# Patient Record
Sex: Male | Born: 1980
Health system: Southern US, Community
[De-identification: ages and names within clinical notes are randomized; demographics above are authoritative.]

## PROBLEM LIST (undated history)

## (undated) DIAGNOSIS — Q357 Cleft uvula: Secondary | ICD-10-CM

## (undated) DIAGNOSIS — F4310 Post-traumatic stress disorder, unspecified: Secondary | ICD-10-CM

## (undated) DIAGNOSIS — F319 Bipolar disorder, unspecified: Secondary | ICD-10-CM

## (undated) DIAGNOSIS — I10 Essential (primary) hypertension: Secondary | ICD-10-CM

## (undated) DIAGNOSIS — F909 Attention-deficit hyperactivity disorder, unspecified type: Secondary | ICD-10-CM

## (undated) HISTORY — PX: EXTERNAL EAR SURGERY: SHX627

## (undated) HISTORY — DX: Essential (primary) hypertension: I10

## (undated) HISTORY — DX: Post-traumatic stress disorder, unspecified: F43.10

## (undated) HISTORY — DX: Cleft uvula: Q35.7

---

## 2000-09-14 ENCOUNTER — Encounter: Payer: Self-pay | Admitting: Internal Medicine

## 2000-09-14 ENCOUNTER — Emergency Department (HOSPITAL_COMMUNITY): Admission: EM | Admit: 2000-09-14 | Discharge: 2000-09-14 | Payer: Self-pay | Admitting: Internal Medicine

## 2000-09-15 ENCOUNTER — Emergency Department (HOSPITAL_COMMUNITY): Admission: EM | Admit: 2000-09-15 | Discharge: 2000-09-15 | Payer: Self-pay | Admitting: Emergency Medicine

## 2000-09-15 ENCOUNTER — Encounter: Payer: Self-pay | Admitting: Emergency Medicine

## 2001-08-18 ENCOUNTER — Ambulatory Visit (HOSPITAL_COMMUNITY): Admission: RE | Admit: 2001-08-18 | Discharge: 2001-08-18 | Payer: Self-pay | Admitting: Otolaryngology

## 2001-08-18 ENCOUNTER — Encounter: Payer: Self-pay | Admitting: Otolaryngology

## 2004-07-30 ENCOUNTER — Emergency Department (HOSPITAL_COMMUNITY): Admission: EM | Admit: 2004-07-30 | Discharge: 2004-07-31 | Payer: Self-pay | Admitting: *Deleted

## 2004-09-28 ENCOUNTER — Emergency Department (HOSPITAL_COMMUNITY): Admission: EM | Admit: 2004-09-28 | Discharge: 2004-09-28 | Payer: Self-pay | Admitting: Emergency Medicine

## 2004-09-30 ENCOUNTER — Ambulatory Visit: Payer: Self-pay | Admitting: Orthopedic Surgery

## 2004-10-01 ENCOUNTER — Encounter (HOSPITAL_COMMUNITY): Admission: RE | Admit: 2004-10-01 | Discharge: 2004-10-16 | Payer: Self-pay | Admitting: Neurosurgery

## 2005-02-14 ENCOUNTER — Emergency Department (HOSPITAL_COMMUNITY): Admission: EM | Admit: 2005-02-14 | Discharge: 2005-02-14 | Payer: Self-pay | Admitting: Emergency Medicine

## 2005-06-07 ENCOUNTER — Emergency Department (HOSPITAL_COMMUNITY): Admission: EM | Admit: 2005-06-07 | Discharge: 2005-06-07 | Payer: Self-pay | Admitting: Emergency Medicine

## 2005-11-07 ENCOUNTER — Emergency Department (HOSPITAL_COMMUNITY): Admission: EM | Admit: 2005-11-07 | Discharge: 2005-11-08 | Payer: Self-pay | Admitting: Emergency Medicine

## 2005-11-10 ENCOUNTER — Emergency Department (HOSPITAL_COMMUNITY): Admission: EM | Admit: 2005-11-10 | Discharge: 2005-11-10 | Payer: Self-pay | Admitting: Emergency Medicine

## 2006-01-30 ENCOUNTER — Emergency Department (HOSPITAL_COMMUNITY): Admission: EM | Admit: 2006-01-30 | Discharge: 2006-01-30 | Payer: Self-pay | Admitting: Emergency Medicine

## 2006-01-31 ENCOUNTER — Emergency Department (HOSPITAL_COMMUNITY): Admission: EM | Admit: 2006-01-31 | Discharge: 2006-02-01 | Payer: Self-pay | Admitting: Emergency Medicine

## 2006-02-01 ENCOUNTER — Inpatient Hospital Stay (HOSPITAL_COMMUNITY): Admission: EM | Admit: 2006-02-01 | Discharge: 2006-02-01 | Payer: Self-pay | Admitting: Psychiatry

## 2006-02-01 ENCOUNTER — Ambulatory Visit: Payer: Self-pay | Admitting: Psychiatry

## 2006-08-03 ENCOUNTER — Emergency Department (HOSPITAL_COMMUNITY): Admission: EM | Admit: 2006-08-03 | Discharge: 2006-08-03 | Payer: Self-pay | Admitting: Emergency Medicine

## 2006-11-07 ENCOUNTER — Emergency Department (HOSPITAL_COMMUNITY): Admission: EM | Admit: 2006-11-07 | Discharge: 2006-11-07 | Payer: Self-pay | Admitting: Emergency Medicine

## 2007-03-11 ENCOUNTER — Emergency Department (HOSPITAL_COMMUNITY): Admission: EM | Admit: 2007-03-11 | Discharge: 2007-03-11 | Payer: Self-pay | Admitting: Emergency Medicine

## 2007-05-22 ENCOUNTER — Emergency Department (HOSPITAL_COMMUNITY): Admission: EM | Admit: 2007-05-22 | Discharge: 2007-05-23 | Payer: Self-pay | Admitting: Emergency Medicine

## 2007-07-18 ENCOUNTER — Emergency Department (HOSPITAL_COMMUNITY): Admission: EM | Admit: 2007-07-18 | Discharge: 2007-07-18 | Payer: Self-pay | Admitting: Emergency Medicine

## 2008-02-04 ENCOUNTER — Emergency Department (HOSPITAL_COMMUNITY): Admission: EM | Admit: 2008-02-04 | Discharge: 2008-02-05 | Payer: Self-pay | Admitting: Emergency Medicine

## 2008-06-23 ENCOUNTER — Emergency Department (HOSPITAL_COMMUNITY): Admission: EM | Admit: 2008-06-23 | Discharge: 2008-06-23 | Payer: Self-pay | Admitting: Emergency Medicine

## 2009-04-09 ENCOUNTER — Emergency Department (HOSPITAL_COMMUNITY): Admission: EM | Admit: 2009-04-09 | Discharge: 2009-04-09 | Payer: Self-pay | Admitting: Emergency Medicine

## 2009-04-24 ENCOUNTER — Emergency Department (HOSPITAL_COMMUNITY): Admission: EM | Admit: 2009-04-24 | Discharge: 2009-04-24 | Payer: Self-pay | Admitting: Emergency Medicine

## 2009-04-28 ENCOUNTER — Emergency Department (HOSPITAL_COMMUNITY): Admission: EM | Admit: 2009-04-28 | Discharge: 2009-04-28 | Payer: Self-pay | Admitting: Emergency Medicine

## 2009-05-01 ENCOUNTER — Other Ambulatory Visit: Payer: Self-pay

## 2009-05-02 ENCOUNTER — Ambulatory Visit: Payer: Self-pay | Admitting: Psychiatry

## 2009-05-02 ENCOUNTER — Inpatient Hospital Stay (HOSPITAL_COMMUNITY): Admission: RE | Admit: 2009-05-02 | Discharge: 2009-05-06 | Payer: Self-pay | Admitting: Psychiatry

## 2010-02-08 ENCOUNTER — Encounter: Payer: Self-pay | Admitting: Emergency Medicine

## 2010-04-07 LAB — VALPROIC ACID LEVEL: Valproic Acid Lvl: 52.3 ug/mL (ref 50.0–100.0)

## 2010-04-08 LAB — DIFFERENTIAL
Basophils Absolute: 0 10*3/uL (ref 0.0–0.1)
Basophils Relative: 0 % (ref 0–1)
Basophils Relative: 0 % (ref 0–1)
Blasts: 0 %
Eosinophils Relative: 0 % (ref 0–5)
Eosinophils Relative: 2 % (ref 0–5)
Eosinophils Relative: 1 % (ref 0–5)
Lymphocytes Relative: 36 % (ref 12–46)
Lymphocytes Relative: 26 % (ref 12–46)
Lymphs Abs: 3 10*3/uL (ref 0.7–4.0)
Lymphs Abs: 3.4 10*3/uL (ref 0.7–4.0)
Metamyelocytes Relative: 0 %
Monocytes Absolute: 0.6 10*3/uL (ref 0.1–1.0)
Monocytes Absolute: 0.7 10*3/uL (ref 0.1–1.0)
Monocytes Relative: 9 % (ref 3–12)
Monocytes Relative: 5 % (ref 3–12)
Myelocytes: 0 %
Neutro Abs: 8.8 10*3/uL — ABNORMAL HIGH (ref 1.7–7.7)
Neutrophils Relative %: 67 % (ref 43–77)
nRBC: 0 /100 WBC

## 2010-04-08 LAB — BASIC METABOLIC PANEL
BUN: 11 mg/dL (ref 6–23)
Calcium: 9.2 mg/dL (ref 8.4–10.5)
Calcium: 9.2 mg/dL (ref 8.4–10.5)
Chloride: 104 mEq/L (ref 96–112)
GFR calc Af Amer: >60 mL/min (ref >60)
GFR calc non Af Amer: >60 mL/min (ref >60)
GFR calc non Af Amer: >60 mL/min (ref >60)
GFR calc non Af Amer: >60 mL/min (ref >60)
Glucose, Bld: 81 mg/dL (ref 70–99)
Glucose, Bld: 83 mg/dL (ref 70–99)
Glucose, Bld: 102 mg/dL — ABNORMAL HIGH (ref 70–99)
Potassium: 3.6 mEq/L (ref 3.5–5.1)
Potassium: 3.6 mEq/L (ref 3.5–5.1)
Potassium: 3.7 mEq/L (ref 3.5–5.1)

## 2010-04-08 LAB — POCT CARDIAC MARKERS
CKMB, poc: <1 ng/mL — ABNORMAL LOW (ref 1.0–8.0)
Myoglobin, poc: 72.3 ng/mL (ref 12–200)
Troponin i, poc: <0.05 ng/mL (ref 0.00–0.09)

## 2010-04-08 LAB — RAPID URINE DRUG SCREEN, HOSP PERFORMED
Amphetamines: NOT DETECTED
Barbiturates: NOT DETECTED
Barbiturates: NOT DETECTED
Benzodiazepines: POSITIVE — AB
Opiates: NOT DETECTED
Opiates: NOT DETECTED
Tetrahydrocannabinol: NOT DETECTED

## 2010-04-08 LAB — ETHANOL: Alcohol, Ethyl (B): <5 mg/dL (ref 0–10)

## 2010-04-08 LAB — URINALYSIS, ROUTINE W REFLEX MICROSCOPIC
Bilirubin Urine: NEGATIVE
Ketones, ur: NEGATIVE mg/dL
Urobilinogen, UA: 0.2 mg/dL (ref 0.0–1.0)

## 2010-04-08 LAB — CBC
HCT: 44.9 % (ref 39.0–52.0)
Hemoglobin: 15.9 g/dL (ref 13.0–17.0)
MCHC: 35 g/dL (ref 30.0–36.0)
MCHC: 35.3 g/dL (ref 30.0–36.0)
MCV: 93.9 fL (ref 78.0–100.0)
MCV: 95.1 fL (ref 78.0–100.0)
MCV: 95.1 fL (ref 78.0–100.0)
Platelets: 238 10*3/uL (ref 150–400)
RBC: 4.72 MIL/uL (ref 4.22–5.81)
RDW: 12.4 % (ref 11.5–15.5)
RDW: 12.5 % (ref 11.5–15.5)
WBC: 10.5 10*3/uL (ref 4.0–10.5)

## 2010-04-08 LAB — GLUCOSE, CAPILLARY: Glucose-Capillary: 106 mg/dL — ABNORMAL HIGH (ref 70–99)

## 2010-04-12 LAB — GLUCOSE, CAPILLARY
Glucose-Capillary: 80 mg/dL (ref 70–99)
Glucose-Capillary: 88 mg/dL (ref 70–99)

## 2010-04-12 LAB — CBC
HCT: 47.1 % (ref 39.0–52.0)
MCHC: 34.1 g/dL (ref 30.0–36.0)
MCV: 95.6 fL (ref 78.0–100.0)
RBC: 4.93 MIL/uL (ref 4.22–5.81)
WBC: 8.3 10*3/uL (ref 4.0–10.5)

## 2010-04-12 LAB — DIFFERENTIAL
Basophils Relative: 0 % (ref 0–1)
Eosinophils Absolute: 0.2 10*3/uL (ref 0.0–0.7)
Eosinophils Relative: 2 % (ref 0–5)
Lymphs Abs: 2.7 10*3/uL (ref 0.7–4.0)
Neutrophils Relative %: 59 % (ref 43–77)

## 2010-04-12 LAB — D-DIMER, QUANTITATIVE: D-Dimer, Quant: 0.33 ug/mL-FEU (ref 0.00–0.48)

## 2010-04-12 LAB — BASIC METABOLIC PANEL
CO2: 24 mEq/L (ref 19–32)
Calcium: 9.7 mg/dL (ref 8.4–10.5)
Chloride: 105 mEq/L (ref 96–112)
Creatinine, Ser: 1.17 mg/dL (ref 0.4–1.5)
GFR calc Af Amer: >60 mL/min (ref >60)
GFR calc non Af Amer: >60 mL/min (ref >60)
Glucose, Bld: 108 mg/dL — ABNORMAL HIGH (ref 70–99)
Sodium: 136 mEq/L (ref 135–145)

## 2010-04-12 LAB — POCT CARDIAC MARKERS: Troponin i, poc: <0.05 ng/mL (ref 0.00–0.09)

## 2010-04-16 ENCOUNTER — Emergency Department (HOSPITAL_COMMUNITY)
Admission: EM | Admit: 2010-04-16 | Discharge: 2010-04-18 | Disposition: A | Discharge status: Other Acute Inpatient Hospital | Payer: Medicaid Other | Attending: Emergency Medicine | Admitting: Emergency Medicine

## 2010-04-16 DIAGNOSIS — R443 Hallucinations, unspecified: Secondary | ICD-10-CM | POA: Insufficient documentation

## 2010-04-16 DIAGNOSIS — R45850 Homicidal ideations: Secondary | ICD-10-CM | POA: Insufficient documentation

## 2010-04-16 LAB — RAPID URINE DRUG SCREEN, HOSP PERFORMED
Barbiturates: NOT DETECTED
Benzodiazepines: NOT DETECTED
Opiates: NOT DETECTED

## 2010-04-16 LAB — COMPREHENSIVE METABOLIC PANEL
ALT: 54 U/L — ABNORMAL HIGH (ref 0–53)
AST: 37 U/L (ref 0–37)
Albumin: 3.5 g/dL (ref 3.5–5.2)
Alkaline Phosphatase: 57 U/L (ref 39–117)
Chloride: 102 mEq/L (ref 96–112)
Creatinine, Ser: 1.09 mg/dL (ref 0.4–1.5)
GFR calc Af Amer: >60 mL/min (ref >60)
Potassium: 3.9 mEq/L (ref 3.5–5.1)
Sodium: 136 mEq/L (ref 135–145)
Total Bilirubin: 0.4 mg/dL (ref 0.3–1.2)

## 2010-04-16 LAB — DIFFERENTIAL
Basophils Absolute: 0 10*3/uL (ref 0.0–0.1)
Basophils Relative: 0 % (ref 0–1)
Neutro Abs: 4.6 10*3/uL (ref 1.7–7.7)
Neutrophils Relative %: 53 % (ref 43–77)

## 2010-04-16 LAB — URINE MICROSCOPIC-ADD ON

## 2010-04-16 LAB — CBC
Hemoglobin: 14.5 g/dL (ref 13.0–17.0)
RBC: 4.36 MIL/uL (ref 4.22–5.81)
WBC: 8.7 10*3/uL (ref 4.0–10.5)

## 2010-04-16 LAB — URINALYSIS, ROUTINE W REFLEX MICROSCOPIC
Bilirubin Urine: NEGATIVE
Glucose, UA: NEGATIVE mg/dL
Specific Gravity, Urine: >1.03 — ABNORMAL HIGH (ref 1.005–1.030)

## 2010-04-16 LAB — ACETAMINOPHEN LEVEL: Acetaminophen (Tylenol), Serum: <10 ug/mL — ABNORMAL LOW (ref 10–30)

## 2010-04-18 ENCOUNTER — Inpatient Hospital Stay (HOSPITAL_COMMUNITY)
Admission: AD | Admit: 2010-04-18 | Discharge: 2010-04-20 | DRG: 885 | Disposition: A | Discharge status: Home / Self Care | Payer: Medicaid Other | Source: Ambulatory Visit | Attending: Psychiatry | Admitting: Psychiatry

## 2010-04-18 DIAGNOSIS — F319 Bipolar disorder, unspecified: Principal | ICD-10-CM

## 2010-04-18 DIAGNOSIS — R45851 Suicidal ideations: Secondary | ICD-10-CM

## 2010-04-18 DIAGNOSIS — Z880 Allergy status to penicillin: Secondary | ICD-10-CM

## 2010-04-18 DIAGNOSIS — F29 Unspecified psychosis not due to a substance or known physiological condition: Secondary | ICD-10-CM

## 2010-04-19 DIAGNOSIS — F319 Bipolar disorder, unspecified: Secondary | ICD-10-CM

## 2010-04-21 NOTE — Discharge Summary (Signed)
  NAME:  Mark Goodman, READING NO.:  [id number]  MEDICAL RECORD NO.:  [id number]           PATIENT TYPE:  I  LOCATION:  0503                          FACILITY:  [facility]  PHYSICIAN:  Marlis Edelson, DO        DATE OF BIRTH:  [birth year]  DATE OF ADMISSION:  04/18/2010 DATE OF DISCHARGE:  04/20/2010                              DISCHARGE SUMMARY   The patient was admitted on April 18, 2010, discharged on April 20, 2010.  REASON FOR ADMISSION:  The patient presented with auditory hallucinations that were command in nature, has been suicidal for the past week but not at time of admission although appeared anxious.  He was feeling depressed with increased sleep.  Significant labs, urine drug screen positive for amphetamines.  Depakote level was at 56.9, glucose of 111.  Liver enzymes with ALT mildly elevated at 54 with the reference range of 0-53.  Tylenol level was less than 10.  Alcohol level was less than 5.  FINAL DIAGNOSES:  AXIS I:  Bipolar disorder. AXIS II:  Deferred. AXIS IV:  No known medical conditions. AXIS IV:  Financial issues. AXIS V:  55-60.  SIGNIFICANT FINDINGS:  The patient appeared his stated age.  Mood was labile.  He denied any visual or auditory hallucinations.  Denied any suicidal thoughts.  His thought processes are somewhat tangential.  He was easily distracted.  Denied being homicidal or having delusional thinking.  We admitted the patient to the adult milieu.  Considered switching the patient to Abilify.  We lowered the patient's Depakote dose at 1500 mg.  We contacted the patient's wife to address any concerns and to provide information.  The patient was reporting feeling well.  He stated he was going to let go of the past and focus on moving forward.  He felt he was ready for discharge.  He felt he was on the right doses of his medications.  On day of discharge we felt the patient was at his baseline.  He showed no signs of mania or psychotic  symptoms. He promises safety.  He denied any suicidal or homicidal thoughts.  He was doing well in his medications.  He felt he was on the right doses of his medications.  He was logical and goal directed.  His pastor was here to transport him home if we felt he was safe to be discharged.  DISCHARGE MEDICATIONS: 1. Include Klonopin 1 mg b.i.d. p.r.n. 2. Benadryl 50 mg q.h.s. p.r.n. sleep. 3. Depakote 500 mg one in the morning, two at bedtime. 4. Risperdal 1 mg b.i.d.  FOLLOWUP APPOINTMENT:  At [facility] on April 4 at 8:00 a.m., phone number [phone].     Landry Corporal, N.P.   ______________________________ Marlis Edelson, DO    JO/MEDQ  D:  04/20/2010  T:  04/20/2010  Job:  [id number]  Electronically Signed by Limmie PatriciaP. on 04/21/2010 02:16:59 PM Electronically Signed by Marlis Edelson MD on 04/21/2010 08:43:10 PM
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
## 2010-05-12 ENCOUNTER — Emergency Department (HOSPITAL_COMMUNITY)
Admission: EM | Admit: 2010-05-12 | Discharge: 2010-05-12 | Disposition: A | Discharge status: Home / Self Care | Payer: Medicaid Other | Attending: Emergency Medicine | Admitting: Emergency Medicine

## 2010-05-12 DIAGNOSIS — I252 Old myocardial infarction: Secondary | ICD-10-CM | POA: Insufficient documentation

## 2010-05-12 DIAGNOSIS — Z79899 Other long term (current) drug therapy: Secondary | ICD-10-CM | POA: Insufficient documentation

## 2010-05-12 DIAGNOSIS — F411 Generalized anxiety disorder: Secondary | ICD-10-CM | POA: Insufficient documentation

## 2010-05-12 DIAGNOSIS — K219 Gastro-esophageal reflux disease without esophagitis: Secondary | ICD-10-CM | POA: Insufficient documentation

## 2010-05-12 DIAGNOSIS — F3130 Bipolar disorder, current episode depressed, mild or moderate severity, unspecified: Secondary | ICD-10-CM | POA: Insufficient documentation

## 2010-05-12 DIAGNOSIS — F4310 Post-traumatic stress disorder, unspecified: Secondary | ICD-10-CM | POA: Insufficient documentation

## 2010-05-12 LAB — URINALYSIS, ROUTINE W REFLEX MICROSCOPIC
Bilirubin Urine: NEGATIVE
Glucose, UA: NEGATIVE mg/dL
Hgb urine dipstick: NEGATIVE
Ketones, ur: NEGATIVE mg/dL
Specific Gravity, Urine: 1.01 (ref 1.005–1.030)
pH: 5.5 (ref 5.0–8.0)

## 2010-05-12 LAB — BASIC METABOLIC PANEL
CO2: 27 mEq/L (ref 19–32)
Calcium: 9.6 mg/dL (ref 8.4–10.5)
Chloride: 99 mEq/L (ref 96–112)
GFR calc Af Amer: >60 mL/min (ref >60)
Glucose, Bld: 125 mg/dL — ABNORMAL HIGH (ref 70–99)
Potassium: 4 mEq/L (ref 3.5–5.1)
Sodium: 137 mEq/L (ref 135–145)

## 2010-05-12 LAB — DIFFERENTIAL
Basophils Absolute: 0 10*3/uL (ref 0.0–0.1)
Basophils Relative: 0 % (ref 0–1)
Eosinophils Relative: 1 % (ref 0–5)
Monocytes Absolute: 0.4 10*3/uL (ref 0.1–1.0)
Neutro Abs: 4.8 10*3/uL (ref 1.7–7.7)

## 2010-05-12 LAB — CBC
Hemoglobin: 15.5 g/dL (ref 13.0–17.0)
Platelets: 280 10*3/uL (ref 150–400)

## 2010-05-12 LAB — RAPID URINE DRUG SCREEN, HOSP PERFORMED
Amphetamines: NOT DETECTED
Barbiturates: NOT DETECTED
Benzodiazepines: NOT DETECTED

## 2010-06-05 NOTE — Discharge Summary (Signed)
NAME:  Mark Goodman, Mark Goodman NO.:  [id number]   MEDICAL RECORD NO.:  [id number]          PATIENT TYPE:  IPS   LOCATION:  0300                          FACILITY:  [facility]   PHYSICIAN:  Geoffery Lyons, M.D.      DATE OF BIRTH:  [birth year]   DATE OF ADMISSION:  02/01/2006  DATE OF DISCHARGE:  02/02/2006                               DISCHARGE SUMMARY   IDENTIFYING INFORMATION:  This is a 30 year old white male who is  single.  This is a voluntary admission.   HISTORY OF PRESENT ILLNESS:  This 30 year old was referred to [place] after  cutting his wrist.  This was a self-inflicted laceration, superficial,  did not require sutures.  The patient was actually on the phone with his  girlfriend, said that he wanted to get her attention, admitted his  actions were impulsive and quite dramatic and then today regrets his  actions.  He says that he has no true intent for suicide, motivated to  leave the relationship behind him now and focus on being a good father  to his 41-year-old daughter.   PAST PSYCHIATRIC HISTORY:  The patient does have a history of some  anxiety and was seen in the emergency room about two days prior to this  episode at which time he had had a panic attack also while arguing with  the girlfriend.  No prior psychiatric admissions.  No prior suicide  attempts.  No history of substance abuse.  He does have some history of  attention-deficit disorder.   SOCIAL HISTORY:  Single father of 47-year-old, living at home with his  mom.  Had been seeing girlfriend of seven months and broke up with him.  Reports that he was frustrated.  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  Had placed a lot of hopes in  the relationship with his girlfriend and had been planning on marriage.  No legal charges.   ALCOHOL/DRUG HISTORY:  Rare use of alcohol.   MEDICAL HISTORY:  The patient is followed by Dr. Ninetta Lights D. Fanta in  [place], [place].   Medical problems are superficial laceration  to the wrist.  No sutures required.  No other medical problems.   MEDICATIONS:  None.  The patient did receive a diphtheria tetanus  booster in the emergency room.   ALLERGIES:  PENICILLIN.   POSITIVE PHYSICAL FINDINGS:  PE was done in the emergency room and  generally unremarkable.  This is a well-nourished, well-developed male  who appears to be his stated age, in no distress, pleasant, cooperative,  somewhat poor grooming, was in very poor dentition, otherwise generally  normal male.  Normal motor.  Nonfocal neuro exam.  Height 6 feet tall,  weight 191 pounds, temperature 97.6, pulse 72, respirations 16, blood  pressure 126/82.   LABORATORY DATA:  Diagnostic studies were within normal limits.  CBC is  normal.  Chemistries all within normal limits.  Urine drug screen was  positive for benzodiazepines which he had been given two days prior in  the emergency room.   MENTAL STATUS  EXAM:  Fully alert male pleasant, cooperative, articulate,  candid, does have a sense of humor.  He is appropriate.  Admits to  having some problems with anxiety.  Feeling frustrated with the  relationship, ready to put it behind him.  Speech is articulate and  fluent, appropriate, normal in production, pace and tone.  Mood is  slightly anxious but he is really very appropriate.  Insight is good.  No disordered thoughts.  No suicidal or homicidal thought.  Focused on  getting back to work, wanting to check on his 3-year-old and resume care  of his child.  Cognition is well-preserved.  Impulse control and  judgment within normal limits.   DISCHARGE DIAGNOSES:  AXIS I:  Acute adjustment reaction.  Rule out  underlying mood disorder.  AXIS II:  No diagnosis.  AXIS III:  Superficial laceration to the wrist.  AXIS IV:  Moderate (stress with relationship break up; having a stable  home and supportive family is an asset to him).  AXIS V:  Current 59; past year  69-75.   PLAN:  Discharge the patient today.  We have evaluated him and have  gotten feedback from his parents.  They are comfortable with him coming  home.  I do not believe that he is suicidal.  They will help monitor him  and support him over the course of the next week and we are going to set  him up with [facility] for follow-up evaluation and  counseling.  He will be seen there on Wednesday, February 09, 2006 at 2  p.m.      Margaret A. Scott, N.P.      Geoffery Lyons, M.D.  Electronically Signed    MAS/MEDQ  D:  02/02/2006  T:  02/03/2006  Job:  [id number]

## 2010-10-09 LAB — CBC
Hemoglobin: 16.4
MCHC: 35.3
Platelets: 303
RDW: 12.4

## 2010-10-09 LAB — RAPID URINE DRUG SCREEN, HOSP PERFORMED
Amphetamines: NOT DETECTED
Barbiturates: NOT DETECTED
Benzodiazepines: NOT DETECTED
Cocaine: NOT DETECTED
Opiates: NOT DETECTED
Tetrahydrocannabinol: NOT DETECTED

## 2010-10-09 LAB — BASIC METABOLIC PANEL
BUN: 14
CO2: 28
Calcium: 9.4
Glucose, Bld: 89
Sodium: 136

## 2010-10-09 LAB — DIFFERENTIAL
Basophils Relative: 0
Eosinophils Absolute: 0.3
Eosinophils Relative: 2
Lymphs Abs: 5 — ABNORMAL HIGH
Monocytes Relative: 5

## 2010-10-15 LAB — URINALYSIS, ROUTINE W REFLEX MICROSCOPIC
Glucose, UA: NEGATIVE
Hgb urine dipstick: NEGATIVE
Ketones, ur: NEGATIVE
Protein, ur: NEGATIVE
Urobilinogen, UA: 0.2

## 2011-12-27 ENCOUNTER — Emergency Department (HOSPITAL_COMMUNITY)
Admission: EM | Admit: 2011-12-27 | Discharge: 2011-12-27 | Disposition: A | Discharge status: Home / Self Care | Payer: Medicaid Other | Attending: Emergency Medicine | Admitting: Emergency Medicine

## 2011-12-27 ENCOUNTER — Encounter (HOSPITAL_COMMUNITY): Payer: Self-pay | Admitting: Emergency Medicine

## 2011-12-27 DIAGNOSIS — Z8659 Personal history of other mental and behavioral disorders: Secondary | ICD-10-CM | POA: Insufficient documentation

## 2011-12-27 DIAGNOSIS — R109 Unspecified abdominal pain: Secondary | ICD-10-CM | POA: Insufficient documentation

## 2011-12-27 DIAGNOSIS — R300 Dysuria: Secondary | ICD-10-CM | POA: Insufficient documentation

## 2011-12-27 DIAGNOSIS — N451 Epididymitis: Secondary | ICD-10-CM

## 2011-12-27 DIAGNOSIS — N453 Epididymo-orchitis: Secondary | ICD-10-CM | POA: Insufficient documentation

## 2011-12-27 HISTORY — DX: Bipolar disorder, unspecified: F31.9

## 2011-12-27 LAB — URINALYSIS, ROUTINE W REFLEX MICROSCOPIC
Bilirubin Urine: NEGATIVE
Glucose, UA: NEGATIVE mg/dL
Ketones, ur: NEGATIVE mg/dL
pH: 6.5 (ref 5.0–8.0)

## 2011-12-27 MED ORDER — NAPROXEN 500 MG PO TABS: 500.0000 mg | ORAL_TABLET | Freq: Two times a day (BID) | ORAL | Status: DC

## 2011-12-27 MED ORDER — KETOROLAC TROMETHAMINE 60 MG/2ML IM SOLN
60.0000 mg | Freq: Once | INTRAMUSCULAR | Status: DC
  Filled 2011-12-27: qty 2

## 2011-12-27 MED ORDER — DOXYCYCLINE HYCLATE 100 MG PO CAPS: 100.0000 mg | ORAL_CAPSULE | Freq: Two times a day (BID) | ORAL | Status: DC

## 2011-12-27 MED ORDER — CEFTRIAXONE SODIUM 250 MG IJ SOLR
250.0000 mg | Freq: Once | INTRAMUSCULAR | Status: AC
  Administered 2011-12-27: 250 mg via INTRAMUSCULAR
  Filled 2011-12-27: qty 250

## 2011-12-27 NOTE — ED Notes (Signed)
States that he started having abdominal pain and pain under his testicles with coughing.  States the pain under his testicles is worse with coughing.  States that he thought he was constipated and took Ex lax with good results.

## 2011-12-27 NOTE — Discharge Instructions (Signed)
Epididymitis Epididymitis is a swelling (inflammation) of the epididymis. The epididymis is a cord-like structure along the back part of the testicle. Epididymitis is usually, but not always, caused by infection. This is usually a sudden problem beginning with chills, fever and pain behind the scrotum and in the testicle. There may be swelling and redness of the testicle. DIAGNOSIS  Physical examination will reveal a tender, swollen epididymis. Sometimes, cultures are obtained from the urine or from prostate secretions to help find out if there is an infection or if the cause is a different problem. Sometimes, blood work is performed to see if your white blood cell count is elevated and if a germ (bacterial) or viral infection is present. Using this knowledge, an appropriate medicine which kills germs (antibiotic) can be chosen by your caregiver. A viral infection causing epididymitis will most often go away (resolve) without treatment. HOME CARE INSTRUCTIONS   Hot sitz baths for 20 minutes, 4 times per day, may help relieve pain.  Only take over-the-counter or prescription medicines for pain, discomfort or fever as directed by your caregiver.  Take all medicines, including antibiotics, as directed. Take the antibiotics for the full prescribed length of time even if you are feeling better.  It is very important to keep all follow-up appointments. SEEK IMMEDIATE MEDICAL CARE IF:   You have a fever.  You have pain not relieved with medicines.  You have any worsening of your problems.  Your pain seems to come and go.  You develop pain, redness, and swelling in the scrotum and surrounding areas. MAKE SURE YOU:   Understand these instructions.  Will watch your condition.  Will get help right away if you are not doing well or get worse. Document Released: 01/02/2000 Document Revised: 03/29/2011 Document Reviewed: 11/21/2008 [facility] Patient Information 2013 [place], [place].  Take  antibiotic as directed and until finished. Take Naprosyn for the next 7 days. Call urology for followup. Return if not improving in 2 days.

## 2011-12-27 NOTE — ED Notes (Signed)
Patient states that he has noticed that when trying to pass urine, he has a weak stream.

## 2011-12-27 NOTE — ED Provider Notes (Addendum)
History     CSN: [id number]  Arrival date & time 12/27/11  4098   First MD Initiated Contact with Patient 12/27/11 0117      Chief Complaint  Patient presents with  . Testicle Pain  . Abdominal Pain    (Consider location/radiation/quality/duration/timing/severity/associated sxs/prior treatment) The history is provided by the patient.  patient is a 31 year old male with 2 day history of right testicle pain no injury. In slowly getting worse currently the pain is 5-8/10. Described as sharp worse with movement or palpation of the testicle. Denies any penile discharge denies a history of similar problem pain does radiate up into the suprapubic area no nausea no vomiting no fever.  Past Medical History  Diagnosis Date  . Bipolar 1 disorder     No past surgical history on file.  No family history on file.  History  Substance Use Topics  . Smoking status: Not on file  . Smokeless tobacco: Not on file  . Alcohol Use:       Review of Systems  Constitutional: Negative for fever.  HENT: Negative for congestion.   Respiratory: Negative for shortness of breath.   Cardiovascular: Negative for chest pain.  Gastrointestinal: Negative for nausea, vomiting and abdominal pain.  Genitourinary: Positive for decreased urine volume and testicular pain. Negative for dysuria, urgency, hematuria, flank pain and genital sores.  Neurological: Negative for headaches.  Hematological: Does not bruise/bleed easily.    Allergies  Review of patient's allergies indicates no known allergies.  Home Medications   Current Outpatient Rx  Name  Route  Sig  Dispense  Refill  . DOXYCYCLINE HYCLATE 100 MG PO CAPS   Oral   Take 1 capsule (100 mg total) by mouth 2 (two) times daily.   20 capsule   0   . NAPROXEN 500 MG PO TABS   Oral   Take 1 tablet (500 mg total) by mouth 2 (two) times daily.   14 tablet   0     BP 149/97  Pulse 83  Temp 97.9 F (36.6 C) (Oral)  Resp 20  Ht 5\' 11"  (1.803  m)  Wt 232 lb (105.235 kg)  BMI 32.36 kg/m2  SpO2 99%  Physical Exam  Nursing note and vitals reviewed. Constitutional: He is oriented to person, place, and time. He appears well-developed and well-nourished. No distress.  HENT:  Head: Normocephalic and atraumatic.  Mouth/Throat: Oropharynx is clear and moist.  Eyes: Conjunctivae normal and EOM are normal. Pupils are equal, round, and reactive to light.  Neck: Normal range of motion. Neck supple.  Cardiovascular: Normal rate, regular rhythm and normal heart sounds.   Pulmonary/Chest: Effort normal and breath sounds normal.  Abdominal: Soft. Bowel sounds are normal. There is no tenderness.  Genitourinary: Penis normal.       No groin hernia no discharge tenderness to the right epididymis no significant testicular swelling. Left testicle without any tenderness.  Musculoskeletal: Normal range of motion. He exhibits no edema.  Neurological: He is alert and oriented to person, place, and time. No cranial nerve deficit. He exhibits normal muscle tone. Coordination normal.    ED Course  Procedures (including critical care time)   Labs Reviewed  URINALYSIS, ROUTINE W REFLEX MICROSCOPIC   No results found.   1. Epididymitis       MDM  Patient with tenderness to the right posterior testicle and epididymis gland. Consistent with epididymitis. Urinalysis however is normal. Testicle otherwise is normal no problem with the left testicle. No  discharge. We'll treat as if it's epididymitis patient will followup with urology. Patient knows he should be improved in a couple days. Patient retrieve the doxycycline and Naprosyn at home patient to get IM Rocephin here.        Shelda Jakes, MD 12/27/11 1610  Shelda Jakes, MD 12/27/11 0230

## 2012-09-06 ENCOUNTER — Encounter (HOSPITAL_COMMUNITY): Payer: Self-pay | Admitting: *Deleted

## 2012-09-06 ENCOUNTER — Emergency Department (HOSPITAL_COMMUNITY)
Admission: EM | Admit: 2012-09-06 | Discharge: 2012-09-06 | Disposition: A | Discharge status: Home / Self Care | Payer: Medicare Other | Attending: Emergency Medicine | Admitting: Emergency Medicine

## 2012-09-06 DIAGNOSIS — R51 Headache: Secondary | ICD-10-CM | POA: Insufficient documentation

## 2012-09-06 DIAGNOSIS — Z792 Long term (current) use of antibiotics: Secondary | ICD-10-CM | POA: Insufficient documentation

## 2012-09-06 DIAGNOSIS — H60399 Other infective otitis externa, unspecified ear: Secondary | ICD-10-CM | POA: Insufficient documentation

## 2012-09-06 DIAGNOSIS — H9210 Otorrhea, unspecified ear: Secondary | ICD-10-CM | POA: Insufficient documentation

## 2012-09-06 DIAGNOSIS — Z880 Allergy status to penicillin: Secondary | ICD-10-CM | POA: Insufficient documentation

## 2012-09-06 DIAGNOSIS — H6091 Unspecified otitis externa, right ear: Secondary | ICD-10-CM

## 2012-09-06 DIAGNOSIS — Z8659 Personal history of other mental and behavioral disorders: Secondary | ICD-10-CM | POA: Insufficient documentation

## 2012-09-06 DIAGNOSIS — F17200 Nicotine dependence, unspecified, uncomplicated: Secondary | ICD-10-CM | POA: Insufficient documentation

## 2012-09-06 HISTORY — DX: Attention-deficit hyperactivity disorder, unspecified type: F90.9

## 2012-09-06 MED ORDER — HYDROCODONE-ACETAMINOPHEN 5-325 MG PO TABS: ORAL_TABLET | ORAL | Status: DC

## 2012-09-06 MED ORDER — CIPROFLOXACIN-HYDROCORTISONE 0.2-1 % OT SUSP: 3.0000 [drp] | Freq: Two times a day (BID) | OTIC | Status: DC

## 2012-09-06 MED ORDER — CEFUROXIME AXETIL 500 MG PO TABS: 250.0000 mg | ORAL_TABLET | Freq: Two times a day (BID) | ORAL | Status: DC

## 2012-09-06 MED ORDER — MELOXICAM 7.5 MG PO TABS: ORAL_TABLET | ORAL | Status: DC

## 2012-09-06 NOTE — ED Provider Notes (Signed)
CSN: [id number]     Arrival date & time 09/06/12  1022 History     First MD Initiated Contact with Patient 09/06/12 1037     Chief Complaint  Patient presents with  . Otalgia   (Consider location/radiation/quality/duration/timing/severity/associated sxs/prior Treatment) Patient is a 32 y.o. male presenting with ear pain. The history is provided by the patient.  Otalgia Location:  Right Behind ear:  No abnormality Quality:  Aching and sharp Severity:  Severe Onset quality:  Gradual Duration:  1 week Timing:  Intermittent Chronicity:  New Context: not direct blow, not foreign body in ear and not loud noise   Relieved by:  Nothing Worsened by:  Nothing tried Ineffective treatments:  OTC medications Associated symptoms: ear discharge and headaches   Associated symptoms: no abdominal pain, no cough, no fever and no neck pain     Past Medical History  Diagnosis Date  . Bipolar 1 disorder   . ADHD (attention deficit hyperactivity disorder)    History reviewed. No pertinent past surgical history. No family history on file. History  Substance Use Topics  . Smoking status: Current Every Day Smoker    Types: Cigarettes  . Smokeless tobacco: Not on file  . Alcohol Use: No    Review of Systems  Constitutional: Negative for fever and activity change.       All ROS Neg except as noted in HPI  HENT: Positive for ear pain and ear discharge. Negative for nosebleeds and neck pain.   Eyes: Negative for photophobia and discharge.  Respiratory: Negative for cough, shortness of breath and wheezing.   Cardiovascular: Negative for chest pain and palpitations.  Gastrointestinal: Negative for abdominal pain and blood in stool.  Genitourinary: Negative for dysuria, frequency and hematuria.  Musculoskeletal: Negative for back pain and arthralgias.  Skin: Negative.   Neurological: Positive for headaches. Negative for dizziness, seizures and speech difficulty.  Psychiatric/Behavioral:  Negative for hallucinations and confusion.    Allergies  Penicillins  Home Medications   Current Outpatient Rx  Name  Route  Sig  Dispense  Refill  . cefUROXime (CEFTIN) 500 MG tablet   Oral   Take 0.5 tablets (250 mg total) by mouth 2 (two) times daily.   14 tablet   0   . ciprofloxacin-hydrocortisone (CIPRO HC) otic suspension   Right Ear   Place 3 drops into the right ear 2 (two) times daily.   10 mL   0   . doxycycline (VIBRAMYCIN) 100 MG capsule   Oral   Take 1 capsule (100 mg total) by mouth 2 (two) times daily.   20 capsule   0   . HYDROcodone-acetaminophen (NORCO) 5-325 MG per tablet      1 or 2 po q4h prn pain   20 tablet   0   . meloxicam (MOBIC) 7.5 MG tablet      1 po bid with food   12 tablet   0   . naproxen (NAPROSYN) 500 MG tablet   Oral   Take 1 tablet (500 mg total) by mouth 2 (two) times daily.   14 tablet   0    BP 124/85  Temp(Src) 97.7 F (36.5 C) (Oral)  Resp 16  Ht 5\' 11"  (1.803 m)  Wt 245 lb (111.131 kg)  BMI 34.19 kg/m2  SpO2 97% Physical Exam  Nursing note and vitals reviewed. Constitutional: He is oriented to person, place, and time. He appears well-developed and well-nourished.  Non-toxic appearance.  HENT:  Head: Normocephalic.  Right Ear: Tympanic membrane normal. There is drainage, swelling and tenderness.  Left Ear: Tympanic membrane and external ear normal.  Right TM obstructed due to swelling of EAC and cerumen.  Eyes: EOM and lids are normal. Pupils are equal, round, and reactive to light.  Neck: Normal range of motion. Neck supple. Carotid bruit is not present.  Cardiovascular: Normal rate, regular rhythm, normal heart sounds, intact distal pulses and normal pulses.   Pulmonary/Chest: Breath sounds normal. No respiratory distress.  Abdominal: Soft. Bowel sounds are normal. There is no tenderness. There is no guarding.  Musculoskeletal: Normal range of motion.  Lymphadenopathy:       Head (right side): No  submandibular adenopathy present.       Head (left side): No submandibular adenopathy present.    He has no cervical adenopathy.  Neurological: He is alert and oriented to person, place, and time. He has normal strength. No cranial nerve deficit or sensory deficit.  Skin: Skin is warm and dry.  Psychiatric: He has a normal mood and affect. His speech is normal.    ED Course   Procedures (including critical care time)  Labs Reviewed - No data to display No results found. No diagnosis found.  MDM  *I have reviewed nursing notes, vital signs, and all appropriate lab and imaging results for this patient.* Pt has hx of ear infections, Required surgery as a child. C/o right ear drainage and pain. EAC swollen. Can not see the TM due to swelling and cerumen. Rx for cipro otic suspension, ceftin, and Norco given to the patient. Pt ask to call Dr Sherwood Gambler, or the [facility] clinic to establish a primary MD.  Kathie Dike, PA-C 09/06/12 1118

## 2012-09-06 NOTE — ED Notes (Signed)
rightt ear pain x 1 week.

## 2012-09-06 NOTE — Progress Notes (Signed)
ED/CM noted patient did not have health insurance and/or PCP listed in the computer.  Patient was given the [place] resource handout with information on the clinics, food pantries, and the handout for new health insurance sign-up.  Patient expressed appreciation for this. 

## 2012-09-07 NOTE — ED Provider Notes (Signed)
Medical screening examination/treatment/procedure(s) were performed by non-physician practitioner and as supervising physician I was immediately available for consultation/collaboration.   Bacilio Abascal W. Brigitt Mcclish, MD 09/07/12 0745 

## 2012-11-15 ENCOUNTER — Emergency Department (HOSPITAL_COMMUNITY): Payer: Medicare Other

## 2012-11-15 ENCOUNTER — Encounter (HOSPITAL_COMMUNITY): Payer: Self-pay | Admitting: Emergency Medicine

## 2012-11-15 ENCOUNTER — Emergency Department (HOSPITAL_COMMUNITY)
Admission: EM | Admit: 2012-11-15 | Discharge: 2012-11-15 | Disposition: A | Discharge status: Home / Self Care | Payer: Medicare Other | Attending: Emergency Medicine | Admitting: Emergency Medicine

## 2012-11-15 DIAGNOSIS — Z79899 Other long term (current) drug therapy: Secondary | ICD-10-CM | POA: Insufficient documentation

## 2012-11-15 DIAGNOSIS — F17200 Nicotine dependence, unspecified, uncomplicated: Secondary | ICD-10-CM | POA: Insufficient documentation

## 2012-11-15 DIAGNOSIS — R109 Unspecified abdominal pain: Secondary | ICD-10-CM

## 2012-11-15 DIAGNOSIS — Z792 Long term (current) use of antibiotics: Secondary | ICD-10-CM | POA: Insufficient documentation

## 2012-11-15 DIAGNOSIS — Z8659 Personal history of other mental and behavioral disorders: Secondary | ICD-10-CM | POA: Insufficient documentation

## 2012-11-15 DIAGNOSIS — Z880 Allergy status to penicillin: Secondary | ICD-10-CM | POA: Insufficient documentation

## 2012-11-15 DIAGNOSIS — R1033 Periumbilical pain: Secondary | ICD-10-CM | POA: Insufficient documentation

## 2012-11-15 LAB — CBC WITH DIFFERENTIAL/PLATELET
Basophils Relative: 0 % (ref 0–1)
Eosinophils Absolute: 0.1 10*3/uL (ref 0.0–0.7)
Eosinophils Relative: 1 % (ref 0–5)
HCT: 46.1 % (ref 39.0–52.0)
Hemoglobin: 16 g/dL (ref 13.0–17.0)
MCH: 31.7 pg (ref 26.0–34.0)
MCHC: 34.7 g/dL (ref 30.0–36.0)
MCV: 91.5 fL (ref 78.0–100.0)
Monocytes Absolute: 0.5 10*3/uL (ref 0.1–1.0)
Monocytes Relative: 6 % (ref 3–12)
Neutro Abs: 4.3 10*3/uL (ref 1.7–7.7)

## 2012-11-15 LAB — COMPREHENSIVE METABOLIC PANEL
Albumin: 3.9 g/dL (ref 3.5–5.2)
BUN: 18 mg/dL (ref 6–23)
Chloride: 101 mEq/L (ref 96–112)
Creatinine, Ser: 1.15 mg/dL (ref 0.50–1.35)
Total Bilirubin: 0.3 mg/dL (ref 0.3–1.2)

## 2012-11-15 LAB — URINALYSIS, ROUTINE W REFLEX MICROSCOPIC
Ketones, ur: NEGATIVE mg/dL
Leukocytes, UA: NEGATIVE
Nitrite: NEGATIVE
Protein, ur: NEGATIVE mg/dL
pH: 7 (ref 5.0–8.0)

## 2012-11-15 LAB — LIPASE, BLOOD: Lipase: 50 U/L (ref 11–59)

## 2012-11-15 MED ORDER — ONDANSETRON HCL 4 MG/2ML IJ SOLN: 4.0000 mg | Freq: Once | INTRAMUSCULAR | Status: DC

## 2012-11-15 MED ORDER — HYDROMORPHONE HCL PF 1 MG/ML IJ SOLN: 1.0000 mg | Freq: Once | INTRAMUSCULAR | Status: DC

## 2012-11-15 MED ORDER — IOHEXOL 300 MG/ML  SOLN
100.0000 mL | Freq: Once | INTRAMUSCULAR | Status: AC | PRN
  Administered 2012-11-15: 100 mL via INTRAVENOUS

## 2012-11-15 MED ORDER — IOHEXOL 300 MG/ML  SOLN
50.0000 mL | Freq: Once | INTRAMUSCULAR | Status: AC | PRN
  Administered 2012-11-15: 50 mL via ORAL

## 2012-11-15 MED ORDER — SODIUM CHLORIDE 0.9 % IV BOLUS (SEPSIS): 1000.0000 mL | Freq: Once | INTRAVENOUS | Status: DC

## 2012-11-15 MED ORDER — PROMETHAZINE HCL 25 MG PO TABS: 25.0000 mg | ORAL_TABLET | Freq: Four times a day (QID) | ORAL | Status: DC | PRN

## 2012-11-15 MED ORDER — OXYCODONE-ACETAMINOPHEN 5-325 MG PO TABS: 2.0000 | ORAL_TABLET | ORAL | Status: DC | PRN

## 2012-11-15 NOTE — ED Provider Notes (Signed)
CSN: [id number]     Arrival date & time 11/15/12  1404 History  This chart was scribed for Donnetta Hutching, MD by Quintella Reichert, ED scribe.  This patient was seen in room APA09/[facility]09 and the patient's care was started at 3:12 PM.   Chief Complaint  Patient presents with  . Abdominal Pain    The history is provided by the patient. No language interpreter was used.    HPI Comments: Mark Goodman is a 32 y.o. male who presents to the Emergency Department complaining of 3 weeks of moderate-to-severe stabbing periumbilical abdominal pain that is worsened after eating.  Pt denies vomiting, diarrhea, fever, urinary symptoms, or any other associated symptoms.  He denies prior h/o similar symptoms.  He has h/o ear surgery but denies h/o abdominal surgery. No radiation of pain   Past Medical History  Diagnosis Date  . Bipolar 1 disorder   . ADHD (attention deficit hyperactivity disorder)     History reviewed. No pertinent past surgical history.   No family history on file.   History  Substance Use Topics  . Smoking status: Current Every Day Smoker    Types: Cigarettes  . Smokeless tobacco: Not on file  . Alcohol Use: No     Review of Systems A complete 10 system review of systems was obtained and all systems are negative except as noted in the HPI and PMH.    Allergies  Penicillins  Home Medications   Current Outpatient Rx  Name  Route  Sig  Dispense  Refill  . acetaminophen (TYLENOL) 500 MG tablet   Oral   Take 500 mg by mouth every 6 (six) hours as needed for pain.         . cefUROXime (CEFTIN) 500 MG tablet   Oral   Take 0.5 tablets (250 mg total) by mouth 2 (two) times daily.   14 tablet   0   . ciprofloxacin-hydrocortisone (CIPRO HC) otic suspension   Right Ear   Place 3 drops into the right ear 2 (two) times daily.   10 mL   0   . HYDROcodone-acetaminophen (NORCO) 5-325 MG per tablet      1 or 2 po q4h prn pain   20 tablet   0   . meloxicam (MOBIC)  7.5 MG tablet      1 po bid with food   12 tablet   0   . ranitidine (ZANTAC) 150 MG tablet   Oral   Take 150 mg by mouth 2 (two) times daily.          BP 143/97  Pulse 84  Temp(Src) 98.2 F (36.8 C) (Oral)  Resp 18  Ht 5\' 11"  (1.803 m)  Wt 254 lb (115.214 kg)  BMI 35.44 kg/m2  SpO2 96%  Physical Exam  Nursing note and vitals reviewed. Constitutional: He is oriented to person, place, and time. He appears well-developed and well-nourished.  HENT:  Head: Normocephalic and atraumatic.  Eyes: Conjunctivae and EOM are normal. Pupils are equal, round, and reactive to light.  Neck: Normal range of motion. Neck supple.  Cardiovascular: Normal rate, regular rhythm and normal heart sounds.   Pulmonary/Chest: Effort normal and breath sounds normal.  Abdominal: Soft. Bowel sounds are normal. There is tenderness.  Most tender to the right periumbilical area Minimally tender to LLQ and RUQ  Musculoskeletal: Normal range of motion.  Neurological: He is alert and oriented to person, place, and time.  Skin: Skin is warm and dry.  Psychiatric: He has a normal mood and affect.    ED Course  Procedures (including critical care time)  DIAGNOSTIC STUDIES: Oxygen Saturation is 96% on room air, normal by my interpretation.    COORDINATION OF CARE: 3:15 PM-Discussed treatment plan which includes labs, pain management, and CT-scan with pt at bedside and pt agreed to plan.    Labs Review Labs Reviewed  COMPREHENSIVE METABOLIC PANEL - Abnormal; Notable for the following:    GFR calc non Af Amer 82 (*)    All other components within normal limits  URINALYSIS, ROUTINE W REFLEX MICROSCOPIC - Abnormal; Notable for the following:    Color, Urine STRAW (*)    Specific Gravity, Urine <1.005 (*)    All other components within normal limits  CBC WITH DIFFERENTIAL  LIPASE, BLOOD    Imaging Review Ct Abdomen Pelvis W Contrast  11/15/2012   CLINICAL DATA:  Abdominal pain.  EXAM: CT ABDOMEN  AND PELVIS WITH CONTRAST  TECHNIQUE: Multidetector CT imaging of the abdomen and pelvis was performed using the standard protocol following bolus administration of intravenous contrast.  CONTRAST:  50mL OMNIPAQUE IOHEXOL 300 MG/ML SOLN, OMNIPAQUE IOHEXOL 300 MG/ML SOLN  COMPARISON:  None.  FINDINGS: Visualized lung bases appear normal. The liver, spleen and pancreas appear normal. No gallstones are noted. Adrenal glands and kidneys appear normal. No renal or ureteral calculi are noted. The appendix appears normal. There is no evidence of bowel obstruction. No abnormal fluid collection is noted. Urinary bladder appears normal. No osseous abnormality is noted.  IMPRESSION: No significant abnormality seen in the abdomen or pelvis.   Electronically Signed   By: Roque Lias M.D.   On: 11/15/2012 17:06    EKG Interpretation   None       MDM  No diagnosis found.   No acute abdomen.  CT scan shows no acute findings.  Discharge medications Percocet, Phenergan 25 mg   I personally performed the services described in this documentation, which was scribed in my presence. The recorded information has been reviewed and is accurate.    Donnetta Hutching, MD 11/15/12 1958

## 2012-11-15 NOTE — ED Notes (Signed)
Complain of abdominal pain. Worse after eating

## 2012-11-15 NOTE — Discharge Instructions (Signed)
Abdominal Pain (Nonspecific) Your exam might not show the exact reason you have abdominal pain. Since there are many different causes of abdominal pain, another checkup and more tests may be needed. It is very important to follow up for lasting (persistent) or worsening symptoms. A possible cause of abdominal pain in any person who still has his or her appendix is acute appendicitis. Appendicitis is often hard to diagnose. Normal blood tests, urine tests, ultrasound, and CT scans do not completely rule out early appendicitis or other causes of abdominal pain. Sometimes, only the changes that happen over time will allow appendicitis and other causes of abdominal pain to be determined. Other potential problems that may require surgery may also take time to become more apparent. Because of this, it is important that you follow all of the instructions below. HOME CARE INSTRUCTIONS   Rest as much as possible.  Do not eat solid food until your pain is gone.  While adults or children have pain: A diet of water, weak decaffeinated tea, broth or bouillon, gelatin, oral rehydration solutions (ORS), frozen ice pops, or ice chips may be helpful.  When pain is gone in adults or children: Start a light diet (dry toast, crackers, applesauce, or white rice). Increase the diet slowly as long as it does not bother you. Eat no dairy products (including cheese and eggs) and no spicy, fatty, fried, or high-fiber foods.  Use no alcohol, caffeine, or cigarettes.  Take your regular medicines unless your caregiver told you not to.  Take any prescribed medicine as directed.  Only take over-the-counter or prescription medicines for pain, discomfort, or fever as directed by your caregiver. Do not give aspirin to children. If your caregiver has given you a follow-up appointment, it is very important to keep that appointment. Not keeping the appointment could result in a permanent injury and/or lasting (chronic) pain and/or  disability. If there is any problem keeping the appointment, you must call to reschedule.  SEEK IMMEDIATE MEDICAL CARE IF:   Your pain is not gone in 24 hours.  Your pain becomes worse, changes location, or feels different.  You or your child has an oral temperature above 102 F (38.9 C), not controlled by medicine.  Your baby is older than 3 months with a rectal temperature of 102 F (38.9 C) or higher.  Your baby is 38 months old or younger with a rectal temperature of 100.4 F (38 C) or higher.  You have shaking chills.  You keep throwing up (vomiting) or cannot drink liquids.  There is blood in your vomit or you see blood in your bowel movements.  Your bowel movements become dark or black.  You have frequent bowel movements.  Your bowel movements stop (become blocked) or you cannot pass gas.  You have bloody, frequent, or painful urination.  You have yellow discoloration in the skin or whites of the eyes.  Your stomach becomes bloated or bigger.  You have dizziness or fainting.  You have chest or back pain. MAKE SURE YOU:   Understand these instructions.  Will watch your condition.  Will get help right away if you are not doing well or get worse. Document Released: 01/04/2005 Document Revised: 03/29/2011 Document Reviewed: 12/02/2008 [facility] Patient Information 2014 [place], [place].   CT scan showed no obvious abnormalities.   Medication for pain and nausea. Clear liquids for the next 12 hours.   Followup your Dr. or return if worse

## 2013-03-14 ENCOUNTER — Emergency Department (HOSPITAL_COMMUNITY)
Admission: EM | Admit: 2013-03-14 | Discharge: 2013-03-14 | Disposition: A | Discharge status: Home / Self Care | Payer: Medicare Other | Attending: Emergency Medicine | Admitting: Emergency Medicine

## 2013-03-14 ENCOUNTER — Emergency Department (HOSPITAL_COMMUNITY): Payer: Medicare Other

## 2013-03-14 ENCOUNTER — Encounter (HOSPITAL_COMMUNITY): Payer: Self-pay | Admitting: Emergency Medicine

## 2013-03-14 DIAGNOSIS — Z79899 Other long term (current) drug therapy: Secondary | ICD-10-CM | POA: Insufficient documentation

## 2013-03-14 DIAGNOSIS — R071 Chest pain on breathing: Secondary | ICD-10-CM | POA: Insufficient documentation

## 2013-03-14 DIAGNOSIS — Z760 Encounter for issue of repeat prescription: Secondary | ICD-10-CM | POA: Insufficient documentation

## 2013-03-14 DIAGNOSIS — Z880 Allergy status to penicillin: Secondary | ICD-10-CM | POA: Insufficient documentation

## 2013-03-14 DIAGNOSIS — Z8659 Personal history of other mental and behavioral disorders: Secondary | ICD-10-CM | POA: Insufficient documentation

## 2013-03-14 DIAGNOSIS — R059 Cough, unspecified: Secondary | ICD-10-CM | POA: Insufficient documentation

## 2013-03-14 DIAGNOSIS — R519 Headache, unspecified: Secondary | ICD-10-CM

## 2013-03-14 DIAGNOSIS — F17200 Nicotine dependence, unspecified, uncomplicated: Secondary | ICD-10-CM | POA: Insufficient documentation

## 2013-03-14 DIAGNOSIS — R05 Cough: Secondary | ICD-10-CM | POA: Insufficient documentation

## 2013-03-14 DIAGNOSIS — R51 Headache: Secondary | ICD-10-CM | POA: Insufficient documentation

## 2013-03-14 DIAGNOSIS — R0789 Other chest pain: Secondary | ICD-10-CM

## 2013-03-14 LAB — URINALYSIS, ROUTINE W REFLEX MICROSCOPIC
Bilirubin Urine: NEGATIVE
GLUCOSE, UA: NEGATIVE mg/dL
HGB URINE DIPSTICK: NEGATIVE
Ketones, ur: NEGATIVE mg/dL
LEUKOCYTES UA: NEGATIVE
Nitrite: NEGATIVE
PH: 6 (ref 5.0–8.0)
Protein, ur: NEGATIVE mg/dL
Specific Gravity, Urine: 1.01 (ref 1.005–1.030)
Urobilinogen, UA: 0.2 mg/dL (ref 0.0–1.0)

## 2013-03-14 LAB — CBC WITH DIFFERENTIAL/PLATELET
Basophils Absolute: 0 10*3/uL (ref 0.0–0.1)
Basophils Relative: 0 % (ref 0–1)
EOS PCT: 1 % (ref 0–5)
Eosinophils Absolute: 0.1 10*3/uL (ref 0.0–0.7)
HEMATOCRIT: 44.6 % (ref 39.0–52.0)
HEMOGLOBIN: 15.7 g/dL (ref 13.0–17.0)
LYMPHS ABS: 4.4 10*3/uL — AB (ref 0.7–4.0)
LYMPHS PCT: 42 % (ref 12–46)
MCH: 32.2 pg (ref 26.0–34.0)
MCHC: 35.2 g/dL (ref 30.0–36.0)
MCV: 91.4 fL (ref 78.0–100.0)
MONOS PCT: 5 % (ref 3–12)
Monocytes Absolute: 0.5 10*3/uL (ref 0.1–1.0)
Neutro Abs: 5.6 10*3/uL (ref 1.7–7.7)
Neutrophils Relative %: 52 % (ref 43–77)
PLATELETS: 291 10*3/uL (ref 150–400)
RBC: 4.88 MIL/uL (ref 4.22–5.81)
RDW: 12.7 % (ref 11.5–15.5)
WBC: 10.6 10*3/uL — AB (ref 4.0–10.5)

## 2013-03-14 LAB — BASIC METABOLIC PANEL
BUN: 16 mg/dL (ref 6–23)
CHLORIDE: 99 meq/L (ref 96–112)
CO2: 26 meq/L (ref 19–32)
Calcium: 9.5 mg/dL (ref 8.4–10.5)
Creatinine, Ser: 1.13 mg/dL (ref 0.50–1.35)
GFR calc Af Amer: >90 mL/min (ref >90)
GFR calc non Af Amer: 83 mL/min — ABNORMAL LOW (ref >90)
GLUCOSE: 115 mg/dL — AB (ref 70–99)
POTASSIUM: 3.8 meq/L (ref 3.7–5.3)
SODIUM: 138 meq/L (ref 137–147)

## 2013-03-14 LAB — TROPONIN I

## 2013-03-14 MED ORDER — OMEPRAZOLE 40 MG PO CPDR: 40.0000 mg | DELAYED_RELEASE_CAPSULE | Freq: Every day | ORAL | Status: DC

## 2013-03-14 NOTE — ED Provider Notes (Signed)
CSN: [id number]     Arrival date & time 03/14/13  2122 History   First MD Initiated Contact with Patient 03/14/13 2149     Chief Complaint  Patient presents with  . Headache  . Chest Pain     (Consider location/radiation/quality/duration/timing/severity/associated sxs/prior Treatment) HPI Comments: Mark Goodman is a 33 y.o. Male presenting with several concerns.  He describes a 2 week history of intermittent generalized headache,  Describing a tightness that starts posteriorly (points to occiput) and radiates across the top of his head affecting his forehead.  He denies dizziness,  Blurred vision, nausea vomiting, focal weakness,  Fever, chills, neck pain or stiffness.  His headache improves with tylenol.  He denies any new or worsened stressors and denies history of headaches.  He has checked his blood pressure several times over the past 2 weeks and it has been consistently elevated, the highest was 148/110, denies prior history of htn, although also states has not seen a doctor in years.    Secondly, he reports a constant left sided chest pain which is worsened with cough, palpation and with movement of his arms.  He denies shortness of breath and his cough has been nonproductive.  This symptom has also been present for about 2 weeks.  He is a 1ppd cigarette smoker.  He denies peripheral edema and orthopnea. He has found no alleviators for this complaint.     The history is provided by the patient.    Past Medical History  Diagnosis Date  . Bipolar 1 disorder   . ADHD (attention deficit hyperactivity disorder)    History reviewed. No pertinent past surgical history. History reviewed. No pertinent family history. History  Substance Use Topics  . Smoking status: Current Every Day Smoker    Types: Cigarettes  . Smokeless tobacco: Not on file  . Alcohol Use: No    Review of Systems  Constitutional: Negative for fever and chills.  HENT: Negative for congestion and sore throat.    Eyes: Negative.   Respiratory: Positive for cough. Negative for chest tightness and shortness of breath.   Cardiovascular: Positive for chest pain. Negative for leg swelling.  Gastrointestinal: Negative for nausea and abdominal pain.  Genitourinary: Negative.   Musculoskeletal: Negative for arthralgias, joint swelling and neck pain.  Skin: Negative.  Negative for rash and wound.  Neurological: Positive for headaches. Negative for dizziness, weakness, light-headedness and numbness.  Psychiatric/Behavioral: Negative.       Allergies  Tomato and Penicillins  Home Medications   Current Outpatient Rx  Name  Route  Sig  Dispense  Refill  . acetaminophen (TYLENOL) 500 MG tablet   Oral   Take 1,500 mg by mouth daily.          Marland Kitchen. omeprazole (PRILOSEC) 40 MG capsule   Oral   Take 40 mg by mouth daily.         Marland Kitchen. omeprazole (PRILOSEC) 40 MG capsule   Oral   Take 1 capsule (40 mg total) by mouth daily.   30 capsule   0    BP 145/84  Pulse 72  Temp(Src) 97.7 F (36.5 C) (Oral)  Resp 20  Ht 5\' 11"  (1.803 m)  Wt 263 lb 8 oz (119.523 kg)  BMI 36.77 kg/m2  SpO2 96% Physical Exam  Nursing note and vitals reviewed. Constitutional: He appears well-developed and well-nourished.  HENT:  Head: Normocephalic and atraumatic.  Eyes: Conjunctivae are normal.  Neck: Normal range of motion.  Cardiovascular: Normal rate, regular  rhythm, normal heart sounds and intact distal pulses.   Pulmonary/Chest: Effort normal and breath sounds normal. He has no wheezes. He exhibits tenderness.    ttp left chest wall along sternal border.    Abdominal: Soft. Bowel sounds are normal. He exhibits no distension. There is no tenderness.  Musculoskeletal: Normal range of motion. He exhibits no edema.  Neurological: He is alert. He has normal strength. No cranial nerve deficit or sensory deficit. Coordination and gait normal.  Equal grip strength  Skin: Skin is warm and dry.  Psychiatric: He has a  normal mood and affect.    ED Course  Procedures (including critical care time) Labs Review Labs Reviewed  BASIC METABOLIC PANEL - Abnormal; Notable for the following:    Glucose, Bld 115 (*)    GFR calc non Af Amer 83 (*)    All other components within normal limits  CBC WITH DIFFERENTIAL - Abnormal; Notable for the following:    WBC 10.6 (*)    Lymphs Abs 4.4 (*)    All other components within normal limits  URINALYSIS, ROUTINE W REFLEX MICROSCOPIC  TROPONIN I   Imaging Review Dg Chest 2 View  03/14/2013   CLINICAL DATA:  33 year old male with chest pain  EXAM: CHEST  2 VIEW  COMPARISON:  04/24/2009 chest radiograph  FINDINGS: The cardiomediastinal silhouette is unremarkable.  There is no evidence of focal airspace disease, pulmonary edema, suspicious pulmonary nodule/mass, pleural effusion, or pneumothorax. No acute bony abnormalities are identified.  IMPRESSION: No active cardiopulmonary disease.   Electronically Signed   By: Laveda Abbe M.D.   On: 03/14/2013 23:06    EKG Interpretation   None      Date: 03/14/2013  Rate: 71  Rhythm: normal sinus rhythm  QRS Axis: normal  Intervals: normal  ST/T Wave abnormalities: normal  Conduction Disutrbances:incomplete rbbb  Narrative Interpretation:   Old EKG Reviewed: none available    MDM   Final diagnoses:  Headache  Chest wall pain  Medication refill    Patients labs and/or radiological studies were viewed and considered during the medical decision making and disposition process. Pt was symptom free at time of dc.  Denies headache at this time. Chest pain is reproducible,  Musculoskeletal per exam.  He was encouraged to establish care with pcp, discussed having recheck of his bp this week and close f/u with patient understands and agrees.  He requested refill on his prilosec.    No neuro deficit on exam or by history to suggest emergent or surgical presentation.  Also discussed worsened sx that should prompt immediate  re-evaluation.           Burgess Amor, PA-C 03/14/13 2347  Burgess Amor, PA-C 03/14/13 2348

## 2013-03-14 NOTE — Discharge Instructions (Signed)
Chest Wall Pain Chest wall pain is pain felt in or around the chest bones and muscles. It may take up to 6 weeks to get better. It may take longer if you are active. Chest wall pain can happen on its own. Other times, things like germs, injury, coughing, or exercise can cause the pain. HOME CARE   Avoid activities that make you tired or cause pain. Try not to use your chest, belly (abdominal), or side muscles. Do not use heavy weights.  Put ice on the sore area.  Put ice in a plastic bag.  Place a towel between your skin and the bag.  Leave the ice on for 15-20 minutes for the first 2 days.  Only take medicine as told by your doctor. GET HELP RIGHT AWAY IF:   You have more pain or are very uncomfortable.  You have a fever.  Your chest pain gets worse.  You have new problems.  You feel sick to your stomach (nauseous) or throw up (vomit).  You start to sweat or feel lightheaded.  You have a cough with mucus (phlegm).  You cough up blood. MAKE SURE YOU:   Understand these instructions.  Will watch your condition.  Will get help right away if you are not doing well or get worse. Document Released: 06/23/2007 Document Revised: 03/29/2011 Document Reviewed: 08/31/2010 [facility] Patient Information 2014 [place], [place].  Headaches, Frequently Asked Questions MIGRAINE HEADACHES Q: What is migraine? What causes it? How can I treat it? A: Generally, migraine headaches begin as a dull ache. Then they develop into a constant, throbbing, and pulsating pain. You may experience pain at the temples. You may experience pain at the front or back of one or both sides of the head. The pain is usually accompanied by a combination of:  Nausea.  Vomiting.  Sensitivity to light and noise. Some people (about 15%) experience an aura (see below) before an attack. The cause of migraine is believed to be chemical reactions in the brain. Treatment for migraine may include over-the-counter or  prescription medications. It may also include self-help techniques. These include relaxation training and biofeedback.  Q: What is an aura? A: About 15% of people with migraine get an "aura". This is a sign of neurological symptoms that occur before a migraine headache. You may see wavy or jagged lines, dots, or flashing lights. You might experience tunnel vision or blind spots in one or both eyes. The aura can include visual or auditory hallucinations (something imagined). It may include disruptions in smell (such as strange odors), taste or touch. Other symptoms include:  Numbness.  A "pins and needles" sensation.  Difficulty in recalling or speaking the correct word. These neurological events may last as long as 60 minutes. These symptoms will fade as the headache begins. Q: What is a trigger? A: Certain physical or environmental factors can lead to or "trigger" a migraine. These include:  Foods.  Hormonal changes.  Weather.  Stress. It is important to remember that triggers are different for everyone. To help prevent migraine attacks, you need to figure out which triggers affect you. Keep a headache diary. This is a good way to track triggers. The diary will help you talk to your healthcare professional about your condition. Q: Does weather affect migraines? A: Bright sunshine, hot, humid conditions, and drastic changes in barometric pressure may lead to, or "trigger," a migraine attack in some people. But studies have shown that weather does not act as a trigger for  everyone with migraines. Q: What is the link between migraine and hormones? A: Hormones start and regulate many of your body's functions. Hormones keep your body in balance within a constantly changing environment. The levels of hormones in your body are unbalanced at times. Examples are during menstruation, pregnancy, or menopause. That can lead to a migraine attack. In fact, about three quarters of all women with migraine  report that their attacks are related to the menstrual cycle.  Q: Is there an increased risk of stroke for migraine sufferers? A: The likelihood of a migraine attack causing a stroke is very remote. That is not to say that migraine sufferers cannot have a stroke associated with their migraines. In persons under age 14, the most common associated factor for stroke is migraine headache. But over the course of a person's normal life span, the occurrence of migraine headache may actually be associated with a reduced risk of dying from cerebrovascular disease due to stroke.  Q: What are acute medications for migraine? A: Acute medications are used to treat the pain of the headache after it has started. Examples over-the-counter medications, NSAIDs, ergots, and triptans.  Q: What are the triptans? A: Triptans are the newest class of abortive medications. They are specifically targeted to treat migraine. Triptans are vasoconstrictors. They moderate some chemical reactions in the brain. The triptans work on receptors in your brain. Triptans help to restore the balance of a neurotransmitter called serotonin. Fluctuations in levels of serotonin are thought to be a main cause of migraine.  Q: Are over-the-counter medications for migraine effective? A: Over-the-counter, or "OTC," medications may be effective in relieving mild to moderate pain and associated symptoms of migraine. But you should see your caregiver before beginning any treatment regimen for migraine.  Q: What are preventive medications for migraine? A: Preventive medications for migraine are sometimes referred to as "prophylactic" treatments. They are used to reduce the frequency, severity, and length of migraine attacks. Examples of preventive medications include antiepileptic medications, antidepressants, beta-blockers, calcium channel blockers, and NSAIDs (nonsteroidal anti-inflammatory drugs). Q: Why are anticonvulsants used to treat migraine? A:  During the past few years, there has been an increased interest in antiepileptic drugs for the prevention of migraine. They are sometimes referred to as "anticonvulsants". Both epilepsy and migraine may be caused by similar reactions in the brain.  Q: Why are antidepressants used to treat migraine? A: Antidepressants are typically used to treat people with depression. They may reduce migraine frequency by regulating chemical levels, such as serotonin, in the brain.  Q: What alternative therapies are used to treat migraine? A: The term "alternative therapies" is often used to describe treatments considered outside the scope of conventional Western medicine. Examples of alternative therapy include acupuncture, acupressure, and yoga. Another common alternative treatment is herbal therapy. Some herbs are believed to relieve headache pain. Always discuss alternative therapies with your caregiver before proceeding. Some herbal products contain arsenic and other toxins. TENSION HEADACHES Q: What is a tension-type headache? What causes it? How can I treat it? A: Tension-type headaches occur randomly. They are often the result of temporary stress, anxiety, fatigue, or anger. Symptoms include soreness in your temples, a tightening band-like sensation around your head (a "vice-like" ache). Symptoms can also include a pulling feeling, pressure sensations, and contracting head and neck muscles. The headache begins in your forehead, temples, or the back of your head and neck. Treatment for tension-type headache may include over-the-counter or prescription medications. Treatment may also include self-help  techniques such as relaxation training and biofeedback. CLUSTER HEADACHES Q: What is a cluster headache? What causes it? How can I treat it? A: Cluster headache gets its name because the attacks come in groups. The pain arrives with little, if any, warning. It is usually on one side of the head. A tearing or bloodshot  eye and a runny nose on the same side of the headache may also accompany the pain. Cluster headaches are believed to be caused by chemical reactions in the brain. They have been described as the most severe and intense of any headache type. Treatment for cluster headache includes prescription medication and oxygen. SINUS HEADACHES Q: What is a sinus headache? What causes it? How can I treat it? A: When a cavity in the bones of the face and skull (a sinus) becomes inflamed, the inflammation will cause localized pain. This condition is usually the result of an allergic reaction, a tumor, or an infection. If your headache is caused by a sinus blockage, such as an infection, you will probably have a fever. An x-ray will confirm a sinus blockage. Your caregiver's treatment might include antibiotics for the infection, as well as antihistamines or decongestants.  REBOUND HEADACHES Q: What is a rebound headache? What causes it? How can I treat it? A: A pattern of taking acute headache medications too often can lead to a condition known as "rebound headache." A pattern of taking too much headache medication includes taking it more than 2 days per week or in excessive amounts. That means more than the label or a caregiver advises. With rebound headaches, your medications not only stop relieving pain, they actually begin to cause headaches. Doctors treat rebound headache by tapering the medication that is being overused. Sometimes your caregiver will gradually substitute a different type of treatment or medication. Stopping may be a challenge. Regularly overusing a medication increases the potential for serious side effects. Consult a caregiver if you regularly use headache medications more than 2 days per week or more than the label advises. ADDITIONAL QUESTIONS AND ANSWERS Q: What is biofeedback? A: Biofeedback is a self-help treatment. Biofeedback uses special equipment to monitor your body's involuntary physical  responses. Biofeedback monitors:  Breathing.  Pulse.  Heart rate.  Temperature.  Muscle tension.  Brain activity. Biofeedback helps you refine and perfect your relaxation exercises. You learn to control the physical responses that are related to stress. Once the technique has been mastered, you do not need the equipment any more. Q: Are headaches hereditary? A: Four out of five (80%) of people that suffer report a family history of migraine. Scientists are not sure if this is genetic or a family predisposition. Despite the uncertainty, a child has a 50% chance of having migraine if one parent suffers. The child has a 75% chance if both parents suffer.  Q: Can children get headaches? A: By the time they reach high school, most young people have experienced some type of headache. Many safe and effective approaches or medications can prevent a headache from occurring or stop it after it has begun.  Q: What type of doctor should I see to diagnose and treat my headache? A: Start with your primary caregiver. Discuss his or her experience and approach to headaches. Discuss methods of classification, diagnosis, and treatment. Your caregiver may decide to recommend you to a headache specialist, depending upon your symptoms or other physical conditions. Having diabetes, allergies, etc., may require a more comprehensive and inclusive approach to your headache. The  National Head[employer] Foundation will provide, upon request, a list of [facility] physician members in your state. Document Released: 03/27/2003 Document Revised: 03/29/2011 Document Reviewed: 09/04/2007 [facility] Patient Information 2014 [place], [place].

## 2013-03-14 NOTE — ED Notes (Addendum)
Headache times 2 weeks.  B/p 142/105 at home. Chest pain off and on too.

## 2013-03-18 NOTE — ED Provider Notes (Signed)
Medical screening examination/treatment/procedure(s) were performed by non-physician practitioner and as supervising physician I was immediately available for consultation/collaboration.   EKG Interpretation   Date/Time:  Wednesday March 14 2013 21:30:52 EST Ventricular Rate:  71 PR Interval:  138 QRS Duration: 108 QT Interval:  378 QTC Calculation: 410 R Axis:   -11 Text Interpretation:  Normal sinus rhythm Incomplete right bundle branch  block Borderline ECG When compared with ECG of 24-Apr-2009 09:09,  Questionable change in QRS axis ED PHYSICIAN INTERPRETATION AVAILABLE IN  [facility] Confirmed by TEST, RECORD ([id number]) on 03/16/2013 9:43:20 AM        Rolland PorterMark Samone Guhl, MD 03/18/13 1551

## 2015-06-09 ENCOUNTER — Encounter (HOSPITAL_COMMUNITY): Payer: Self-pay | Admitting: Emergency Medicine

## 2015-06-09 ENCOUNTER — Emergency Department (HOSPITAL_COMMUNITY)
Admission: EM | Admit: 2015-06-09 | Discharge: 2015-06-09 | Disposition: A | Discharge status: Home / Self Care | Payer: Medicare Other | Attending: Emergency Medicine | Admitting: Emergency Medicine

## 2015-06-09 DIAGNOSIS — F909 Attention-deficit hyperactivity disorder, unspecified type: Secondary | ICD-10-CM | POA: Diagnosis not present

## 2015-06-09 DIAGNOSIS — K529 Noninfective gastroenteritis and colitis, unspecified: Secondary | ICD-10-CM | POA: Insufficient documentation

## 2015-06-09 DIAGNOSIS — Z79899 Other long term (current) drug therapy: Secondary | ICD-10-CM | POA: Diagnosis not present

## 2015-06-09 DIAGNOSIS — F319 Bipolar disorder, unspecified: Secondary | ICD-10-CM | POA: Diagnosis not present

## 2015-06-09 DIAGNOSIS — F17210 Nicotine dependence, cigarettes, uncomplicated: Secondary | ICD-10-CM | POA: Insufficient documentation

## 2015-06-09 DIAGNOSIS — K625 Hemorrhage of anus and rectum: Secondary | ICD-10-CM | POA: Diagnosis present

## 2015-06-09 LAB — CBC WITH DIFFERENTIAL/PLATELET
BASOS ABS: 0 10*3/uL (ref 0.0–0.1)
Basophils Relative: 0 %
Eosinophils Absolute: 0.2 10*3/uL (ref 0.0–0.7)
Eosinophils Relative: 2 %
HEMATOCRIT: 44.4 % (ref 39.0–52.0)
Hemoglobin: 15.4 g/dL (ref 13.0–17.0)
Lymphocytes Relative: 22 %
Lymphs Abs: 2.6 10*3/uL (ref 0.7–4.0)
MCH: 32 pg (ref 26.0–34.0)
MCHC: 34.7 g/dL (ref 30.0–36.0)
MCV: 92.3 fL (ref 78.0–100.0)
MONO ABS: 0.5 10*3/uL (ref 0.1–1.0)
Monocytes Relative: 4 %
Neutro Abs: 8.1 10*3/uL — ABNORMAL HIGH (ref 1.7–7.7)
Neutrophils Relative %: 72 %
Platelets: 369 10*3/uL (ref 150–400)
RBC: 4.81 MIL/uL (ref 4.22–5.81)
RDW: 13 % (ref 11.5–15.5)
WBC: 11.4 10*3/uL — ABNORMAL HIGH (ref 4.0–10.5)

## 2015-06-09 LAB — COMPREHENSIVE METABOLIC PANEL
ALK PHOS: 90 U/L (ref 38–126)
ALT: 45 U/L (ref 17–63)
AST: 33 U/L (ref 15–41)
Albumin: 4.2 g/dL (ref 3.5–5.0)
Anion gap: 9 (ref 5–15)
BILIRUBIN TOTAL: 0.5 mg/dL (ref 0.3–1.2)
BUN: 14 mg/dL (ref 6–20)
CALCIUM: 9.4 mg/dL (ref 8.9–10.3)
CO2: 23 mmol/L (ref 22–32)
Chloride: 104 mmol/L (ref 101–111)
Creatinine, Ser: 1 mg/dL (ref 0.61–1.24)
GFR calc Af Amer: >60 mL/min (ref >60)
GFR calc non Af Amer: >60 mL/min (ref >60)
GLUCOSE: 102 mg/dL — AB (ref 65–99)
Potassium: 4.1 mmol/L (ref 3.5–5.1)
Sodium: 136 mmol/L (ref 135–145)
TOTAL PROTEIN: 8.2 g/dL — AB (ref 6.5–8.1)

## 2015-06-09 LAB — URINALYSIS, ROUTINE W REFLEX MICROSCOPIC
BILIRUBIN URINE: NEGATIVE
GLUCOSE, UA: NEGATIVE mg/dL
KETONES UR: NEGATIVE mg/dL
Leukocytes, UA: NEGATIVE
Nitrite: NEGATIVE
PH: 6 (ref 5.0–8.0)
PROTEIN: NEGATIVE mg/dL
Specific Gravity, Urine: 1.02 (ref 1.005–1.030)

## 2015-06-09 LAB — URINE MICROSCOPIC-ADD ON
Bacteria, UA: NONE SEEN
Squamous Epithelial / LPF: NONE SEEN
WBC, UA: NONE SEEN WBC/hpf (ref 0–5)

## 2015-06-09 LAB — LIPASE, BLOOD: Lipase: 28 U/L (ref 11–51)

## 2015-06-09 MED ORDER — FAMOTIDINE 20 MG PO TABS: 20.0000 mg | ORAL_TABLET | Freq: Two times a day (BID) | ORAL | Status: DC

## 2015-06-09 MED ORDER — LOPERAMIDE HCL 2 MG PO CAPS
2.0000 mg | ORAL_CAPSULE | ORAL | Status: DC | PRN
  Administered 2015-06-09: 2 mg via ORAL
  Filled 2015-06-09: qty 1

## 2015-06-09 MED ORDER — LOPERAMIDE HCL 2 MG PO CAPS: 2.0000 mg | ORAL_CAPSULE | Freq: Four times a day (QID) | ORAL | Status: DC | PRN

## 2015-06-09 MED ORDER — FAMOTIDINE IN NACL 20-0.9 MG/50ML-% IV SOLN
20.0000 mg | Freq: Once | INTRAVENOUS | Status: AC
  Administered 2015-06-09: 20 mg via INTRAVENOUS
  Filled 2015-06-09: qty 50

## 2015-06-09 MED ORDER — ONDANSETRON HCL 4 MG/2ML IJ SOLN
4.0000 mg | Freq: Once | INTRAMUSCULAR | Status: AC
  Administered 2015-06-09: 4 mg via INTRAVENOUS
  Filled 2015-06-09: qty 2

## 2015-06-09 MED ORDER — MORPHINE SULFATE (PF) 4 MG/ML IV SOLN
4.0000 mg | Freq: Once | INTRAVENOUS | Status: AC
  Administered 2015-06-09: 4 mg via INTRAVENOUS
  Filled 2015-06-09: qty 1

## 2015-06-09 MED ORDER — SODIUM CHLORIDE 0.9 % IV BOLUS (SEPSIS)
1000.0000 mL | Freq: Once | INTRAVENOUS | Status: AC
  Administered 2015-06-09: 1000 mL via INTRAVENOUS

## 2015-06-09 NOTE — ED Provider Notes (Signed)
CSN: [id number]     Arrival date & time 06/09/15  [id number] History  By signing my name below, I, Tanda RockersMargaux Venter, attest that this documentation has been prepared under the direction and in the presence of Artice Bergerson, PA-C. Electronically Signed: Tanda RockersMargaux Venter, ED Scribe. 06/09/2015. 8:40 AM.   Chief Complaint  Patient presents with  . Rectal Bleeding   The history is provided by the patient. No language interpreter was used.    HPI Comments: Fontaine NoGary W Blackmon is a 35 y.o. male who presents to the Emergency Department complaining of bright red bloody stools that began this morning at 6:30 AM (approximately 2 hours ago). Pt reports having diarrhea since yesterday with a bowel movement every 30-45 minutes. When he wiped this morning he noticed a small amount of blood on the toilet paper but mentions while in the ED today he saw streaky blood in the toilet bowl itself. Pt has a cramping sensation in his abdomen while having bowel movements that resolves afterwards. He does complain of abdominal bloating. Pt has not taken anything for his symptoms. He admits to eating greasy foods but denies excessive EtOH usage. No recent abdominal surgeries or antibiotic use. No recent sick contact or suspicious food intake.  Denies nausea, vomiting, chest pain, shortness of breath, fever, or any other associated symptoms.    Past Medical History  Diagnosis Date  . Bipolar 1 disorder (HCC)   . ADHD (attention deficit hyperactivity disorder)    Past Surgical History  Procedure Laterality Date  . External ear surgery     No family history on file. Social History  Substance Use Topics  . Smoking status: Current Every Day Smoker    Types: Cigarettes  . Smokeless tobacco: None  . Alcohol Use: No    Review of Systems  Constitutional: Negative for fever, chills and appetite change.  Respiratory: Negative for shortness of breath.   Cardiovascular: Negative for chest pain.  Gastrointestinal: Positive for  abdominal pain, diarrhea and blood in stool. Negative for nausea, vomiting and abdominal distention.  Musculoskeletal: Negative for back pain.  Skin: Negative for rash.  Neurological: Negative for dizziness, weakness, numbness and headaches.  All other systems reviewed and are negative.  Allergies  Tomato and Penicillins  Home Medications   Prior to Admission medications   Medication Sig Start Date End Date Taking? Authorizing Provider  acetaminophen (TYLENOL) 500 MG tablet Take 1,500 mg by mouth daily.     Historical Provider, MD  omeprazole (PRILOSEC) 40 MG capsule Take 40 mg by mouth daily. 11/14/12   Historical Provider, MD  omeprazole (PRILOSEC) 40 MG capsule Take 1 capsule (40 mg total) by mouth daily. 03/14/13   Burgess AmorJulie Idol, PA-C   BP 146/101 mmHg  Pulse 101  Temp(Src) 98.5 F (36.9 C)  Resp 18  Ht 5\' 11"  (1.803 m)  Wt 282 lb (127.914 kg)  BMI 39.35 kg/m2  SpO2 96%   Physical Exam  Constitutional: He is oriented to person, place, and time. He appears well-developed and well-nourished. No distress.  HENT:  Head: Normocephalic and atraumatic.  Eyes: Conjunctivae and EOM are normal.  Neck: Neck supple. No tracheal deviation present.  Cardiovascular: Normal rate, regular rhythm and normal heart sounds.   Pulmonary/Chest: Effort normal and breath sounds normal. No respiratory distress. He has no wheezes. He has no rales.  Abdominal: Soft. He exhibits no distension and no mass. There is tenderness. There is no rebound and no guarding.  Diffuse bilateral lower abdominal pain. No guarding  or rebound tenderness.   Genitourinary:  No stool on digital exam.  No palpable masses, nml rectal tone.  No gross blood  Musculoskeletal: Normal range of motion.  Neurological: He is alert and oriented to person, place, and time.  Skin: Skin is warm and dry.  Psychiatric: He has a normal mood and affect. His behavior is normal.  Nursing note and vitals reviewed.   ED Course  Procedures  (including critical care time)  DIAGNOSTIC STUDIES: Oxygen Saturation is 96% on RA, normal by my interpretation.    COORDINATION OF CARE: 8:39 AM-Discussed treatment plan with pt at bedside and pt agreed to plan.   Labs Review Labs Reviewed - No data to display  Imaging Review No results found. I have personally reviewed and evaluated these images and lab results as part of my medical decision-making.   EKG Interpretation None      MDM   Final diagnoses:  Gastroenteritis, acute    Pt is well appearing.  Vitals stable.  Non-toxic appearing.  Tolerating po fluids.  No concerning sx's for acute abd  Diarrhea likely related to viral process.  Reported hematochezia has improved since ed stay.  Pt given strict return precautions and advised to return for fever, worsening abd pain, vomiting.  Pt agrees to plan.    I personally performed the services described in this documentation, which was scribed in my presence. The recorded information has been reviewed and is accurate.      Pauline Aus, PA-C 06/11/15 1630  Rolland Porter, MD 06/21/15 2101

## 2015-06-09 NOTE — Discharge Instructions (Signed)

## 2015-06-09 NOTE — ED Notes (Signed)
Pt reports diarrhea and low grade fever x 2 days with dark red rectal bleeding today. Reports pulling several tics off of himself recently.

## 2015-06-11 ENCOUNTER — Emergency Department (HOSPITAL_COMMUNITY)
Admission: EM | Admit: 2015-06-11 | Discharge: 2015-06-11 | Disposition: A | Discharge status: Home / Self Care | Payer: Medicare Other | Attending: Emergency Medicine | Admitting: Emergency Medicine

## 2015-06-11 ENCOUNTER — Encounter (HOSPITAL_COMMUNITY): Payer: Self-pay | Admitting: Emergency Medicine

## 2015-06-11 DIAGNOSIS — R197 Diarrhea, unspecified: Secondary | ICD-10-CM | POA: Diagnosis present

## 2015-06-11 DIAGNOSIS — T7840XA Allergy, unspecified, initial encounter: Secondary | ICD-10-CM

## 2015-06-11 DIAGNOSIS — F319 Bipolar disorder, unspecified: Secondary | ICD-10-CM | POA: Diagnosis not present

## 2015-06-11 DIAGNOSIS — R109 Unspecified abdominal pain: Secondary | ICD-10-CM | POA: Insufficient documentation

## 2015-06-11 DIAGNOSIS — L500 Allergic urticaria: Secondary | ICD-10-CM | POA: Insufficient documentation

## 2015-06-11 DIAGNOSIS — Z79899 Other long term (current) drug therapy: Secondary | ICD-10-CM | POA: Insufficient documentation

## 2015-06-11 DIAGNOSIS — F17210 Nicotine dependence, cigarettes, uncomplicated: Secondary | ICD-10-CM | POA: Diagnosis not present

## 2015-06-11 LAB — I-STAT CHEM 8, ED
BUN: 15 mg/dL (ref 6–20)
CALCIUM ION: 1.22 mmol/L (ref 1.12–1.23)
Chloride: 101 mmol/L (ref 101–111)
Creatinine, Ser: 1.3 mg/dL — ABNORMAL HIGH (ref 0.61–1.24)
GLUCOSE: 86 mg/dL (ref 65–99)
HCT: 46 % (ref 39.0–52.0)
Hemoglobin: 15.6 g/dL (ref 13.0–17.0)
Potassium: 3.7 mmol/L (ref 3.5–5.1)
Sodium: 139 mmol/L (ref 135–145)
TCO2: 22 mmol/L (ref 0–100)

## 2015-06-11 MED ORDER — DIPHENHYDRAMINE HCL 25 MG PO TABS: 50.0000 mg | ORAL_TABLET | ORAL | Status: DC | PRN

## 2015-06-11 MED ORDER — DIPHENHYDRAMINE HCL 25 MG PO CAPS
50.0000 mg | ORAL_CAPSULE | Freq: Once | ORAL | Status: AC
  Administered 2015-06-11: 50 mg via ORAL
  Filled 2015-06-11: qty 2

## 2015-06-11 MED ORDER — PREDNISONE 50 MG PO TABS
60.0000 mg | ORAL_TABLET | Freq: Once | ORAL | Status: AC
  Administered 2015-06-11: 60 mg via ORAL
  Filled 2015-06-11: qty 1

## 2015-06-11 NOTE — ED Provider Notes (Signed)
CSN: [id number]     Arrival date & time 06/11/15  1331 History   First MD Initiated Contact with Patient 06/11/15 1431     Chief Complaint  Patient presents with  . Diarrhea    Patient is a 35 y.o. male presenting with diarrhea. The history is provided by the patient.  Diarrhea Quality:  Watery Severity:  Moderate Onset quality:  Gradual Duration:  4 days Timing:  Intermittent Progression:  Unchanged Relieved by:  Nothing Worsened by:  Nothing tried Associated symptoms: abdominal pain and chills   Associated symptoms: no vomiting   Risk factors: no recent antibiotic use and no travel to endemic areas   Patient with diarrhea for past 4 days He was seen on 5/22 for diarrhea/fever and rectal bleeding At that time he was improved and discharged home on imodium and pepcid Since that time diarrhea has continued (blood has resolved) and now has rash throughout body He reports mild SOB No tongue/facial swelling He also reports recent tick bites - one on his leg over 5 months ago, and one on his right arm from past week, he removed those ticks He reports mild HA  Past Medical History  Diagnosis Date  . Bipolar 1 disorder (HCC)   . ADHD (attention deficit hyperactivity disorder)    Past Surgical History  Procedure Laterality Date  . External ear surgery     No family history on file. Social History  Substance Use Topics  . Smoking status: Current Every Day Smoker    Types: Cigarettes  . Smokeless tobacco: None  . Alcohol Use: No    Review of Systems  Constitutional: Positive for chills.  HENT: Negative for facial swelling.   Cardiovascular: Negative for chest pain.  Gastrointestinal: Positive for abdominal pain and diarrhea. Negative for vomiting and blood in stool.  Skin: Positive for rash.  All other systems reviewed and are negative.     Allergies  Tomato and Penicillins  Home Medications   Prior to Admission medications   Medication Sig Start Date End Date  Taking? Authorizing Provider  acetaminophen (TYLENOL) 500 MG tablet Take 1,500 mg by mouth daily as needed for moderate pain.    Yes Historical Provider, MD  famotidine (PEPCID) 20 MG tablet Take 1 tablet (20 mg total) by mouth 2 (two) times daily. 06/09/15  Yes Tammy Triplett, PA-C  gemfibrozil (LOPID) 600 MG tablet Take 1 tablet by mouth 2 (two) times daily. 05/13/15  Yes Historical Provider, MD  hydrOXYzine (VISTARIL) 25 MG capsule Take 1-2 capsules by mouth at bedtime.  05/19/15  Yes Historical Provider, MD  loperamide (IMODIUM) 2 MG capsule Take 1 capsule (2 mg total) by mouth 4 (four) times daily as needed for diarrhea or loose stools. 06/09/15  Yes Tammy Triplett, PA-C  metoprolol succinate (TOPROL-XL) 25 MG 24 hr tablet Take 1 tablet by mouth daily. 05/31/15  Yes Historical Provider, MD  omeprazole (PRILOSEC) 20 MG capsule Take 20 mg by mouth daily.   Yes Historical Provider, MD   BP 146/86 mmHg  Pulse 114  Temp(Src) 97.9 F (36.6 C)  Resp 18  SpO2 95% Physical Exam CONSTITUTIONAL: Well developed/well nourished HEAD: Normocephalic/atraumatic EYES: EOMI/PERRL ENMT: Mucous membranes dry, no angioedema noted.  Uvula midline without exudates/edema NECK: supple no meningeal signs SPINE/BACK:entire spine nontender CV: S1/S2 noted, no murmurs/rubs/gallops noted LUNGS: Lungs are clear to auscultation bilaterally, no apparent distress ABDOMEN: soft, nontender, no rebound or guarding, bowel sounds noted throughout abdomen GU:no cva tenderness NEURO: Pt is awake/alert/appropriate, moves  all extremitiesx4.  No facial droop.   EXTREMITIES: pulses normal/equal, full ROM, urticaria noted throughout chest/back/extremities.  No petechiae noted.  Healing scabs to right LE and right UE from tick bites, no surrounding cellulitis SKIN: warm, color normal PSYCH: anxious  ED Course  Procedures  Medications  diphenhydrAMINE (BENADRYL) capsule 50 mg (50 mg Oral Given 06/11/15 1454)  predniSONE (DELTASONE)  tablet 60 mg (60 mg Oral Given 06/11/15 1454)    Labs Review Labs Reviewed  I-STAT CHEM 8, ED - Abnormal; Notable for the following:    Creatinine, Ser 1.30 (*)    All other components within normal limits   I have personally reviewed and evaluated these lab results as part of my medical decision-making.  3:21 PM Pt well appearing He reports continued diarrhea and now has rash.  Unclear cause of rash, whether from new meds, but this appears allergic in nature.  I don't feel this represents tick borne illness He is nontoxic in appearance 3:37 PM Labs reassuring Will need PCP f/u if diarrhea continues into next week I suspect rash/allergic rxn should improve over next 24-48 hours Discussed strict return precautions   MDM   Final diagnoses:  Diarrhea, unspecified type  Allergic reaction, initial encounter    Nursing notes including past medical history and social history reviewed and considered in documentation Labs/vital reviewed myself and considered during evaluation     Zadie Rhineonald Zeddie Njie, MD 06/11/15 1539

## 2015-06-11 NOTE — ED Notes (Signed)
Pt seen in ED 06/08/15 for diarrhea, fever, tick bites. Pt c/o continued diarrhea and developed new generalized itching rash today.

## 2016-06-07 ENCOUNTER — Emergency Department (HOSPITAL_COMMUNITY)
Admission: EM | Admit: 2016-06-07 | Discharge: 2016-06-07 | Disposition: A | Discharge status: Home / Self Care | Payer: Medicare Other | Attending: Emergency Medicine | Admitting: Emergency Medicine

## 2016-06-07 ENCOUNTER — Encounter (HOSPITAL_COMMUNITY): Payer: Self-pay

## 2016-06-07 DIAGNOSIS — S20461A Insect bite (nonvenomous) of right back wall of thorax, initial encounter: Secondary | ICD-10-CM | POA: Diagnosis not present

## 2016-06-07 DIAGNOSIS — H6504 Acute serous otitis media, recurrent, right ear: Secondary | ICD-10-CM | POA: Diagnosis not present

## 2016-06-07 DIAGNOSIS — W57XXXA Bitten or stung by nonvenomous insect and other nonvenomous arthropods, initial encounter: Secondary | ICD-10-CM | POA: Diagnosis not present

## 2016-06-07 DIAGNOSIS — Z79899 Other long term (current) drug therapy: Secondary | ICD-10-CM | POA: Diagnosis not present

## 2016-06-07 DIAGNOSIS — Y929 Unspecified place or not applicable: Secondary | ICD-10-CM | POA: Diagnosis not present

## 2016-06-07 DIAGNOSIS — R3915 Urgency of urination: Secondary | ICD-10-CM | POA: Insufficient documentation

## 2016-06-07 DIAGNOSIS — R350 Frequency of micturition: Secondary | ICD-10-CM | POA: Diagnosis not present

## 2016-06-07 DIAGNOSIS — F17210 Nicotine dependence, cigarettes, uncomplicated: Secondary | ICD-10-CM | POA: Diagnosis not present

## 2016-06-07 DIAGNOSIS — Y939 Activity, unspecified: Secondary | ICD-10-CM | POA: Insufficient documentation

## 2016-06-07 DIAGNOSIS — Z7982 Long term (current) use of aspirin: Secondary | ICD-10-CM | POA: Insufficient documentation

## 2016-06-07 DIAGNOSIS — Y999 Unspecified external cause status: Secondary | ICD-10-CM | POA: Diagnosis not present

## 2016-06-07 LAB — URINALYSIS, ROUTINE W REFLEX MICROSCOPIC
Bacteria, UA: NONE SEEN
Bilirubin Urine: NEGATIVE
Glucose, UA: NEGATIVE mg/dL
Ketones, ur: NEGATIVE mg/dL
Leukocytes, UA: NEGATIVE
Nitrite: NEGATIVE
PH: 5 (ref 5.0–8.0)
Protein, ur: NEGATIVE mg/dL
SQUAMOUS EPITHELIAL / LPF: NONE SEEN
Specific Gravity, Urine: 1.023 (ref 1.005–1.030)

## 2016-06-07 MED ORDER — DOXYCYCLINE HYCLATE 100 MG PO CAPS: 100.0000 mg | ORAL_CAPSULE | Freq: Two times a day (BID) | ORAL | 0 refills | Status: DC

## 2016-06-07 MED ORDER — NEOMYCIN-POLYMYXIN-HC 3.5-10000-1 OT SUSP: 4.0000 [drp] | Freq: Four times a day (QID) | OTIC | 0 refills | Status: DC

## 2016-06-07 NOTE — Discharge Instructions (Signed)
Clean the tick bite area with mild soap and water.  Take OTC benadryl one capsule every 4-6 hrs for itching.  Return here in 2-3 days for any worsening symptoms.  Follow-up with your doctor for recheck

## 2016-06-07 NOTE — ED Provider Notes (Signed)
[facility]-EMERGENCY DEPT Provider Note   CSN: [id number] Arrival date & time: 06/07/16  [id number]   By signing my name below, I, Clarisse GougeXavier Herndon, attest that this documentation has been prepared under the direction and in the presence of Kebra Lowrimore, PA-C. Electronically Signed: Clarisse GougeXavier Herndon, Scribe. 06/07/16. 12:22 PM.   History   Chief Complaint Chief Complaint  Patient presents with  . Wound Check  . Otalgia   The history is provided by the patient and medical records. No language interpreter was used.    Mark Goodman is a 36 y.o. male with h/o ear tube placement who presents to the Emergency Department with concern for multiple complaints. Pt reports gradually worsening, constant, 7/10 R ear pain > 2-3 days. R ear allegedly draining recently. No chills/ fevers.  Tick bite noted to R flank 2-3 days ago. Progressive redness and itching noted, both spreading to R nipple. Pt's tattoo artist allegedly removed the body of the tick since the tick bite noticed. Denies joint pains, other rash, headaches or myalgias.    Past Medical History:  Diagnosis Date  . ADHD (attention deficit hyperactivity disorder)   . Bipolar 1 disorder (HCC)     There are no active problems to display for this patient.   Past Surgical History:  Procedure Laterality Date  . EXTERNAL EAR SURGERY         Home Medications    Prior to Admission medications   Medication Sig Start Date End Date Taking? Authorizing Provider  acetaminophen (TYLENOL) 500 MG tablet Take 1,500 mg by mouth daily as needed for moderate pain.    Yes [provider]  aspirin EC 81 MG tablet Take 81 mg by mouth daily.   Yes [provider]  gemfibrozil (LOPID) 600 MG tablet Take 1 tablet by mouth 2 (two) times daily. 05/13/15  Yes [provider]  hydrOXYzine (VISTARIL) 25 MG capsule Take 1-2 capsules by mouth at bedtime.  05/19/15  Yes [provider]  metoprolol succinate (TOPROL-XL) 25 MG 24 hr  tablet Take 1 tablet by mouth daily. 05/31/15  Yes [provider]  omeprazole (PRILOSEC) 20 MG capsule Take 20 mg by mouth daily.   Yes [provider]    Family History No family history on file.  Social History Social History  Substance Use Topics  . Smoking status: Current Every Day Smoker    Types: Cigarettes  . Smokeless tobacco: Never Used  . Alcohol use No     Allergies   Tomato and Penicillins   Review of Systems Review of Systems  Constitutional: Negative for chills and fever.  HENT: Positive for ear discharge and ear pain.   Respiratory: Negative for cough.   Cardiovascular: Negative for leg swelling.  Gastrointestinal: Negative for abdominal pain, nausea and vomiting.  Genitourinary: Positive for frequency and urgency.  Musculoskeletal: Negative for arthralgias, back pain, gait problem and joint swelling.  Skin: Positive for color change (tick bite). Negative for rash.  All other systems reviewed and are negative.    Physical Exam Updated Vital Signs BP (!) 143/90 (BP Location: Left Arm)   Pulse 73   Temp 97.7 F (36.5 C) (Oral)   Resp 20   Ht 5\' 11"  (1.803 m)   Wt 275 lb (124.7 kg)   SpO2 96%   BMI 38.35 kg/m   Physical Exam  Constitutional: He is oriented to person, place, and time. He appears well-developed and well-nourished.  HENT:  Head: Normocephalic.  Mouth/Throat: Oropharynx is clear  and moist.  Cerous drainage in R ear canal, TM not well visualized  Eyes: EOM are normal.  Neck: Normal range of motion.  Cardiovascular: Normal rate, regular rhythm, normal heart sounds and intact distal pulses.   No murmur heard. Pulmonary/Chest: Effort normal and breath sounds normal. No respiratory distress. He has no wheezes.  Abdominal: Soft. He exhibits no distension. There is no tenderness.  Musculoskeletal: Normal range of motion.  Neurological: He is alert and oriented to person, place, and time. No sensory deficit.  Skin: Skin  is warm. Rash noted. There is erythema.  7 cm area of erythema R mid back, no induration or drainage  Psychiatric: He has a normal mood and affect.  Nursing note and vitals reviewed.    ED Treatments / Results  DIAGNOSTIC STUDIES: Oxygen Saturation is 96% on RA, adequate by my interpretation.    COORDINATION OF CARE: 12:15 PM-Discussed next steps with pt. Pt verbalized understanding and is agreeable with the plan. Will order medication. Pt prepared for d/c, advised of symptomatic care at home, F/U instructions and return precautions.    Labs (all labs ordered are listed, but only abnormal results are displayed) Labs Reviewed  URINALYSIS, ROUTINE W REFLEX MICROSCOPIC - Abnormal; Notable for the following:       Result Value   Hgb urine dipstick SMALL (*)    All other components within normal limits    EKG  EKG Interpretation None       Radiology No results found.  Procedures Procedures (including critical care time)  Medications Ordered in ED Medications - No data to display   Initial Impression / Assessment and Plan / ED Course  I have reviewed the triage vital signs and the nursing notes.  Pertinent labs & imaging results that were available during my care of the patient were reviewed by me and considered in my medical decision making (see chart for details).     Pt well appearing.  Vitals stable.  Leading edge of erythema of the tick bite marked by me.  Return precautions discussed.    Final Clinical Impressions(s) / ED Diagnoses   Final diagnoses:  Recurrent acute serous otitis media of right ear  Tick bite, initial encounter    New Prescriptions New Prescriptions   No medications on file  I personally performed the services described in this documentation, which was scribed in my presence. The recorded information has been reviewed and is accurate.    Pauline Aus, PA-C 06/11/16 2317    Bethann Berkshire, MD 06/12/16 [phone]

## 2016-06-07 NOTE — ED Triage Notes (Signed)
Pt reports has what he thinks is a tick bite on r side of his back.  Area red and reports itching.   Pt also reports pain and drainage in r ear.  Pt also reports woke up with his bed wet this morning and also a few days ago.  Pt says he urinates but still feels like bladder is full.

## 2016-06-09 LAB — URINE CULTURE: Culture: NO GROWTH

## 2016-11-08 ENCOUNTER — Telehealth: Payer: Self-pay

## 2016-11-08 ENCOUNTER — Institutional Professional Consult (permissible substitution): Payer: Medicare Other | Admitting: Neurology

## 2016-11-08 NOTE — Telephone Encounter (Signed)
Pt did not show for their appt with Dr. Athar today.  

## 2016-11-09 ENCOUNTER — Encounter: Payer: Self-pay | Admitting: Neurology

## 2017-06-02 ENCOUNTER — Other Ambulatory Visit: Payer: Self-pay

## 2017-06-02 ENCOUNTER — Encounter (HOSPITAL_COMMUNITY): Payer: Self-pay | Admitting: Emergency Medicine

## 2017-06-02 ENCOUNTER — Emergency Department (HOSPITAL_COMMUNITY)
Admission: EM | Admit: 2017-06-02 | Discharge: 2017-06-02 | Disposition: A | Discharge status: Home / Self Care | Payer: Medicare Other | Attending: Emergency Medicine | Admitting: Emergency Medicine

## 2017-06-02 ENCOUNTER — Emergency Department (HOSPITAL_COMMUNITY): Payer: Medicare Other

## 2017-06-02 DIAGNOSIS — Y939 Activity, unspecified: Secondary | ICD-10-CM | POA: Insufficient documentation

## 2017-06-02 DIAGNOSIS — T7840XA Allergy, unspecified, initial encounter: Secondary | ICD-10-CM | POA: Diagnosis not present

## 2017-06-02 DIAGNOSIS — F17210 Nicotine dependence, cigarettes, uncomplicated: Secondary | ICD-10-CM | POA: Diagnosis not present

## 2017-06-02 DIAGNOSIS — R1900 Intra-abdominal and pelvic swelling, mass and lump, unspecified site: Secondary | ICD-10-CM | POA: Diagnosis present

## 2017-06-02 DIAGNOSIS — Z7982 Long term (current) use of aspirin: Secondary | ICD-10-CM | POA: Insufficient documentation

## 2017-06-02 DIAGNOSIS — I10 Essential (primary) hypertension: Secondary | ICD-10-CM | POA: Diagnosis not present

## 2017-06-02 DIAGNOSIS — Z79899 Other long term (current) drug therapy: Secondary | ICD-10-CM | POA: Insufficient documentation

## 2017-06-02 DIAGNOSIS — W57XXXA Bitten or stung by nonvenomous insect and other nonvenomous arthropods, initial encounter: Secondary | ICD-10-CM | POA: Diagnosis not present

## 2017-06-02 DIAGNOSIS — Y999 Unspecified external cause status: Secondary | ICD-10-CM | POA: Diagnosis not present

## 2017-06-02 DIAGNOSIS — S30863A Insect bite (nonvenomous) of scrotum and testes, initial encounter: Secondary | ICD-10-CM | POA: Diagnosis not present

## 2017-06-02 DIAGNOSIS — Y929 Unspecified place or not applicable: Secondary | ICD-10-CM | POA: Diagnosis not present

## 2017-06-02 LAB — CBC WITH DIFFERENTIAL/PLATELET
Basophils Absolute: 0 10*3/uL (ref 0.0–0.1)
Basophils Relative: 0 %
Eosinophils Absolute: 0.7 10*3/uL (ref 0.0–0.7)
Eosinophils Relative: 5 %
HCT: 45.2 % (ref 39.0–52.0)
Hemoglobin: 15.1 g/dL (ref 13.0–17.0)
LYMPHS ABS: 4.1 10*3/uL — AB (ref 0.7–4.0)
LYMPHS PCT: 32 %
MCH: 31.3 pg (ref 26.0–34.0)
MCHC: 33.4 g/dL (ref 30.0–36.0)
MCV: 93.8 fL (ref 78.0–100.0)
Monocytes Absolute: 0.7 10*3/uL (ref 0.1–1.0)
Monocytes Relative: 6 %
NEUTROS PCT: 57 %
Neutro Abs: 7.2 10*3/uL (ref 1.7–7.7)
Platelets: 414 10*3/uL — ABNORMAL HIGH (ref 150–400)
RBC: 4.82 MIL/uL (ref 4.22–5.81)
RDW: 13.1 % (ref 11.5–15.5)
WBC: 12.7 10*3/uL — ABNORMAL HIGH (ref 4.0–10.5)

## 2017-06-02 LAB — BASIC METABOLIC PANEL
Anion gap: 11 (ref 5–15)
BUN: 18 mg/dL (ref 6–20)
CALCIUM: 9.6 mg/dL (ref 8.9–10.3)
CHLORIDE: 100 mmol/L — AB (ref 101–111)
CO2: 26 mmol/L (ref 22–32)
Creatinine, Ser: 1.15 mg/dL (ref 0.61–1.24)
GFR calc non Af Amer: >60 mL/min (ref >60)
Glucose, Bld: 104 mg/dL — ABNORMAL HIGH (ref 65–99)
Potassium: 3.7 mmol/L (ref 3.5–5.1)
Sodium: 137 mmol/L (ref 135–145)

## 2017-06-02 MED ORDER — PREDNISONE 20 MG PO TABS: 20.0000 mg | ORAL_TABLET | Freq: Two times a day (BID) | ORAL | 0 refills | Status: DC

## 2017-06-02 MED ORDER — PREDNISONE 50 MG PO TABS
60.0000 mg | ORAL_TABLET | Freq: Once | ORAL | Status: AC
  Administered 2017-06-02: 60 mg via ORAL
  Filled 2017-06-02: qty 1

## 2017-06-02 MED ORDER — FAMOTIDINE 20 MG PO TABS
20.0000 mg | ORAL_TABLET | Freq: Once | ORAL | Status: AC
  Administered 2017-06-02: 20 mg via ORAL
  Filled 2017-06-02: qty 1

## 2017-06-02 MED ORDER — DOXYCYCLINE HYCLATE 100 MG PO CAPS: 100.0000 mg | ORAL_CAPSULE | Freq: Two times a day (BID) | ORAL | 0 refills | Status: DC

## 2017-06-02 MED ORDER — DOXYCYCLINE HYCLATE 100 MG PO TABS
100.0000 mg | ORAL_TABLET | Freq: Two times a day (BID) | ORAL | Status: DC
  Administered 2017-06-02: 100 mg via ORAL
  Filled 2017-06-02: qty 1

## 2017-06-02 NOTE — ED Provider Notes (Signed)
[facility] EMERGENCY DEPARTMENT Provider Note   CSN: [id number] Arrival date & time: 06/02/17  1721     History   Chief Complaint Chief Complaint  Patient presents with  . Insect Bite    HPI Mark Goodman is a 37 y.o. male.  HPI   He presents for evaluation of swelling in the pelvic region, penis and scrotum which started today.  He was bit twice by ticks, several days ago, which he removed by pulling them off.  He saw Dr. at an urgent care today and was given an injection of unknown antibiotic and told to follow-up here if not better or having other problems.  He denies urethral discharge, fever, chills, nausea, vomiting, weakness or dizziness.  There are no other known modifying factors.  Past Medical History:  Diagnosis Date  . ADHD (attention deficit hyperactivity disorder)   . Bipolar 1 disorder (HCC)   . Cleft uvula   . Hypertension   . PTSD (post-traumatic stress disorder)     There are no active problems to display for this patient.   Past Surgical History:  Procedure Laterality Date  . EXTERNAL EAR SURGERY          Home Medications    Prior to Admission medications   Medication Sig Start Date End Date Taking? Authorizing Provider  acetaminophen (TYLENOL) 500 MG tablet Take 1,500 mg by mouth daily as needed for moderate pain.     [provider]  aspirin EC 81 MG tablet Take 81 mg by mouth daily.    [provider]  doxycycline (VIBRAMYCIN) 100 MG capsule Take 1 capsule (100 mg total) by mouth 2 (two) times daily. One po bid x 7 days 06/02/17   Mancel Bale, MD  gemfibrozil (LOPID) 600 MG tablet Take 1 tablet by mouth 2 (two) times daily. 05/13/15   [provider]  hydrOXYzine (VISTARIL) 25 MG capsule Take 1-2 capsules by mouth at bedtime.  05/19/15   [provider]  metoprolol succinate (TOPROL-XL) 25 MG 24 hr tablet Take 1 tablet by mouth daily. 05/31/15   [provider]  neomycin-polymyxin-hydrocortisone  (CORTISPORIN) 3.5-10000-1 otic suspension Place 4 drops into the right ear 4 (four) times daily. For 10 days 06/07/16   Triplett, Tammy, PA-C  omeprazole (PRILOSEC) 20 MG capsule Take 20 mg by mouth daily.    [provider]  pantoprazole (PROTONIX) 40 MG tablet Take 40 mg by mouth daily.    [provider]  predniSONE (DELTASONE) 20 MG tablet Take 1 tablet (20 mg total) by mouth 2 (two) times daily. 06/02/17   Mancel Bale, MD    Family History No family history on file.  Social History Social History   Tobacco Use  . Smoking status: Current Every Day Smoker    Packs/day: 1.00    Types: Cigarettes  . Smokeless tobacco: Never Used  Substance Use Topics  . Alcohol use: No  . Drug use: No     Allergies   Tomato and Penicillins   Review of Systems Review of Systems  All other systems reviewed and are negative.    Physical Exam Updated Vital Signs BP 126/82   Pulse 84   Temp 97.6 F (36.4 C) (Oral)   Resp 16   Ht  (1.803 m)   Wt 129.3 kg (285 lb)   SpO2 96%   BMI 39.75 kg/m   Physical Exam  Constitutional: He is oriented to person, place, and time. He appears well-developed and well-nourished.  HENT:  Head: Normocephalic and atraumatic.  Right Ear: External ear normal.  Left Ear: External ear normal.  Eyes: Pupils are equal, round, and reactive to light. Conjunctivae and EOM are normal.  Neck: Normal range of motion and phonation normal. Neck supple.  Cardiovascular: Normal rate, regular rhythm and normal heart sounds.  Pulmonary/Chest: Effort normal and breath sounds normal. He exhibits no bony tenderness.  Abdominal: Soft. There is no tenderness.  Genitourinary:  Genitourinary Comments: Mild edema of the foreskin remnant, behind the glans.  No phimosis.  Mild left hemiscrotum swelling of the scrotal wall.  Mild testicular tenderness left without enlargement.  Normal right-sided scrotum and right testicle.  Musculoskeletal: Normal range  of motion.  Neurological: He is alert and oriented to person, place, and time. No cranial nerve deficit or sensory deficit. He exhibits normal muscle tone. Coordination normal.  Skin: Skin is warm, dry and intact.  2 small skin punctures left anterior groin region, consistent with site of recent tick bites.  There are no associated swelling, drainage or bleeding from these sites.  Psychiatric: He has a normal mood and affect. His behavior is normal. Judgment and thought content normal.  Nursing note and vitals reviewed.    ED Treatments / Results  Labs (all labs ordered are listed, but only abnormal results are displayed) Labs Reviewed  BASIC METABOLIC PANEL - Abnormal; Notable for the following components:      Result Value   Chloride 100 (*)    Glucose, Bld 104 (*)    All other components within normal limits  CBC WITH DIFFERENTIAL/PLATELET - Abnormal; Notable for the following components:   WBC 12.7 (*)    Platelets 414 (*)    Lymphs Abs 4.1 (*)    All other components within normal limits  ROCKY MTN SPOTTED FVR ABS PNL(IGG+IGM)  LYME DISEASE DNA BY PCR(BORRELIA BURG)    EKG None  Radiology US Scrotum W/doppler  Result Date: 06/02/2017 CLINICAL DATA:  LEFT testicle swelling and pain since last night. He bite on the LEFT testicle last night. Difficulty with urination and swollen penis. EXAM: SCROTAL ULTRASOUND DOPPLER ULTRASOUND OF THE TESTICLES TECHNIQUE: Complete ultrasound examination of the testicles, epididymis, and other scrotal structures was performed. Color and spectral Doppler ultrasound were also utilized to evaluate blood flow to the testicles. COMPARISON:  None. FINDINGS: Right testicle Measurements: 4.5 x 3.1 x 4.0 centimeters. No mass or microlithiasis visualized. Left testicle Measurements: 4.6 x 2.9 x 3.5 centimeters. A 3 x 4 millimeter cyst is identified within the UPPER pole of the testicle. No suspicious mass or fluid collection. Right epididymis:  Normal in  size and appearance. Left epididymis:  Normal in size and appearance. Hydrocele:  Bilateral hydrocele Varicocele:  None visualized. Pulsed Doppler interrogation of both testes demonstrates normal low resistance arterial and venous waveforms bilaterally. Additional: Superior to the LEFT testicle, there is a palpable lump. Ultrasound of this region demonstrates soft tissue with increased vascularity. No fluid collections are identified in this region. IMPRESSION: 1. No evidence for testicular torsion or mass. 2. Palpable lump superior to the LEFT testicle shows edematous and hyperemic tissue without focal mass or fluid collection. 3. Incidental note of a small benign cyst in the LEFT testicle. Electronically Signed   By: Norva Pavlov M.D.   On: 06/02/2017 19:39    Procedures Procedures (including critical care time)  Medications Ordered in ED Medications  doxycycline (VIBRA-TABS) tablet 100 mg (100 mg Oral Given 06/02/17 1815)  predniSONE (DELTASONE) tablet 60 mg (  60 mg Oral Given 06/02/17 1815)  famotidine (PEPCID) tablet 20 mg (20 mg Oral Given 06/02/17 2007)     Initial Impression / Assessment and Plan / ED Course  I have reviewed the triage vital signs and the nursing notes.  Pertinent labs & imaging results that were available during my care of the patient were reviewed by me and considered in my medical decision making (see chart for details).  Clinical Course as of Jun 02 2113  Thu Jun 02, 2017  1803 Penis and scrotal swelling consistent with angioedema, without sign for frank infection.  Unclear if this is related to tic bite, or some other process.  Screening labs ordered.  Will begin treatment with prednisone.   [EW]    Clinical Course User Index [EW] Mancel Bale, MD     Patient Vitals for the past 24 hrs:  BP Temp Temp src Pulse Resp SpO2 Height Weight  06/02/17 1922 126/82 - - 84 16 96 % - -  06/02/17 1800 (!) 145/89 - - 82 14 97 % - -  06/02/17 1725 (!) 141/94 97.6 F  (36.4 C) Oral 90 19 97 %  (1.803 m) 129.3 kg (285 lb)    9:00 PM Reevaluation with update and discussion. After initial assessment and treatment, an updated evaluation reveals angioedema of foreskin tissue, is marginally increased at this time.  No other significant changes.  Findings discussed with patient and family members, all questions answered. Mancel Bale   Medical Decision Making: Tick bite several days ago, with genital region angioedema consistent with allergic reaction.  Doubt tick infection, but possibly an allergic reaction to the bite.  Doubt sepsis, metabolic instability or impending vascular collapse.  CRITICAL CARE-no Performed by: Mancel Bale   Nursing Notes Reviewed/ Care Coordinated Applicable Imaging Reviewed Interpretation of Laboratory Data incorporated into ED treatment  The patient appears reasonably screened and/or stabilized for discharge and I doubt any other medical condition or other [facility] requiring further screening, evaluation, or treatment in the ED at this time prior to discharge.  Plan: Home Medications-OTC antihistamine Pepcid plus Benadryl; Home Treatments-cool compress to swollen area; return here if the recommended treatment, does not improve the symptoms; Recommended follow up-PCP follow-up 1 week and as needed.  Work release for 4 days.    Final Clinical Impressions(s) / ED Diagnoses   Final diagnoses:  Tick bite, initial encounter  Allergic reaction, initial encounter    ED Discharge Orders        Ordered    predniSONE (DELTASONE) 20 MG tablet  2 times daily     06/02/17 2045    doxycycline (VIBRAMYCIN) 100 MG capsule  2 times daily     06/02/17 2045       Mancel Bale, MD 06/02/17 2117

## 2017-06-02 NOTE — Discharge Instructions (Addendum)
The swelling of this scrotum and penis, appear to be an allergic reaction.  It is possible that it is associated with a tick bites, but that would be unusual.  To treat the allergic reaction take the prednisone as prescribed.  Additionally to treat the allergic reaction take Benadryl 25 to 50 mg 4 times a day, and Pepcid 20 mg twice a day for 5 days each.   We are going to cover you with an antibiotic, called doxycycline to treat for possible tick infection.  Make sure that you are getting plenty of rest and drinking a lot of fluids.  Rest, and avoid heat, to prevent the swelling from getting worse.  You can also try using a cool compress on the swollen area 3 or 4 times a day for 30 minutes.  Return here, if needed, for problems.

## 2017-06-02 NOTE — ED Triage Notes (Signed)
Went to urgent care earlier today for bite on testicle, was given abx rx.  Pt now has swelling to his lower abd, penis and lt testicle.  Pt reports difficulty with urination

## 2017-06-04 LAB — ROCKY MTN SPOTTED FVR ABS PNL(IGG+IGM)
RMSF IGG: NEGATIVE
RMSF IgM: 0.94 index — ABNORMAL HIGH (ref 0.00–0.89)

## 2017-08-22 ENCOUNTER — Encounter (HOSPITAL_COMMUNITY): Payer: Self-pay

## 2017-08-22 ENCOUNTER — Other Ambulatory Visit: Payer: Self-pay

## 2017-08-22 ENCOUNTER — Emergency Department (HOSPITAL_COMMUNITY)
Admission: EM | Admit: 2017-08-22 | Discharge: 2017-08-22 | Disposition: A | Discharge status: Home / Self Care | Payer: Medicare Other | Attending: Emergency Medicine | Admitting: Emergency Medicine

## 2017-08-22 DIAGNOSIS — S59911A Unspecified injury of right forearm, initial encounter: Secondary | ICD-10-CM | POA: Diagnosis present

## 2017-08-22 DIAGNOSIS — Z79899 Other long term (current) drug therapy: Secondary | ICD-10-CM | POA: Insufficient documentation

## 2017-08-22 DIAGNOSIS — Y929 Unspecified place or not applicable: Secondary | ICD-10-CM | POA: Insufficient documentation

## 2017-08-22 DIAGNOSIS — Y9389 Activity, other specified: Secondary | ICD-10-CM | POA: Diagnosis not present

## 2017-08-22 DIAGNOSIS — Z7982 Long term (current) use of aspirin: Secondary | ICD-10-CM | POA: Insufficient documentation

## 2017-08-22 DIAGNOSIS — S51811A Laceration without foreign body of right forearm, initial encounter: Secondary | ICD-10-CM | POA: Insufficient documentation

## 2017-08-22 DIAGNOSIS — I10 Essential (primary) hypertension: Secondary | ICD-10-CM | POA: Insufficient documentation

## 2017-08-22 DIAGNOSIS — W268XXA Contact with other sharp object(s), not elsewhere classified, initial encounter: Secondary | ICD-10-CM | POA: Diagnosis not present

## 2017-08-22 DIAGNOSIS — F17210 Nicotine dependence, cigarettes, uncomplicated: Secondary | ICD-10-CM | POA: Diagnosis not present

## 2017-08-22 DIAGNOSIS — Y999 Unspecified external cause status: Secondary | ICD-10-CM | POA: Insufficient documentation

## 2017-08-22 MED ORDER — LIDOCAINE-EPINEPHRINE (PF) 2 %-1:200000 IJ SOLN
5.0000 mL | Freq: Once | INTRAMUSCULAR | Status: AC
  Administered 2017-08-22: 5 mL
  Filled 2017-08-22: qty 20

## 2017-08-22 NOTE — Discharge Instructions (Addendum)
Clean with mild soap and water water. Do not scrub. Pat dry and allow to completely air dry before applying a thin layer of antibiotic ointment or vaseline. Do not soak for long periods of time (swimming, sauna, hot tub, etc). Keep covered while working. Suture removal in ~10 days.

## 2017-08-22 NOTE — ED Provider Notes (Signed)
[facility]-EMERGENCY DEPT Provider Note   CSN: [id number] Arrival date & time: 08/22/17  0931     History   Chief Complaint Chief Complaint  Patient presents with  . Laceration    HPI Mark Goodman is a 37 y.o. male.  HPI   36yM With R forearm laceration. Happened just before arrival. Was cutting sheet metal when the metal cut his arm. No other injuries. Bleeding controlled with pressure. No numbness, tingling. Tetanus current.   Past Medical History:  Diagnosis Date  . ADHD (attention deficit hyperactivity disorder)   . Bipolar 1 disorder (HCC)   . Cleft uvula   . Hypertension   . PTSD (post-traumatic stress disorder)     There are no active problems to display for this patient.   Past Surgical History:  Procedure Laterality Date  . EXTERNAL EAR SURGERY          Home Medications    Prior to Admission medications   Medication Sig Start Date End Date Taking? Authorizing Provider  acetaminophen (TYLENOL) 500 MG tablet Take 1,500 mg by mouth daily as needed for moderate pain.    Yes [provider]  aspirin EC 81 MG tablet Take 81 mg by mouth daily.   Yes [provider]  gemfibrozil (LOPID) 600 MG tablet Take 1 tablet by mouth 2 (two) times daily. 05/13/15  Yes [provider]  hydrochlorothiazide (HYDRODIURIL) 25 MG tablet Take 25 mg by mouth daily. 07/16/17  Yes [provider]  hydrOXYzine (ATARAX/VISTARIL) 25 MG tablet Take 25-50 mg by mouth at bedtime as needed for sleep. 07/27/17  Yes [provider]  metoprolol succinate (TOPROL-XL) 25 MG 24 hr tablet Take 1 tablet by mouth daily. 05/31/15  Yes [provider]  omeprazole (PRILOSEC) 20 MG capsule Take 20 mg by mouth daily.   Yes [provider]  propranolol (INDERAL) 20 MG tablet Take 20 mg by mouth 2 (two) times daily. 07/16/17  Yes [provider]  doxycycline (VIBRAMYCIN) 100 MG capsule Take 1 capsule (100 mg total)  by mouth 2 (two) times daily. One po bid x 7 days Patient not taking: Reported on 08/22/2017 06/02/17   Mancel Bale, MD  neomycin-polymyxin-hydrocortisone (CORTISPORIN) 3.5-10000-1 otic suspension Place 4 drops into the right ear 4 (four) times daily. For 10 days Patient not taking: Reported on 08/22/2017 06/07/16   Triplett, Tammy, PA-C  predniSONE (DELTASONE) 20 MG tablet Take 1 tablet (20 mg total) by mouth 2 (two) times daily. Patient not taking: Reported on 08/22/2017 06/02/17   Mancel Bale, MD    Family History No family history on file.  Social History Social History   Tobacco Use  . Smoking status: Current Every Day Smoker    Packs/day: 1.00    Types: Cigarettes  . Smokeless tobacco: Never Used  Substance Use Topics  . Alcohol use: No  . Drug use: No     Allergies   Tomato and Penicillins   Review of Systems Review of Systems All systems reviewed and negative, other than as noted in HPI.   Physical Exam Updated Vital Signs BP (!) 153/107 (BP Location: Left Arm)   Pulse 78   Temp 97.9 F (36.6 C) (Oral)   Resp 16   Ht 5\' 11"  (1.803 m)   Wt 124.7 kg (275 lb)   SpO2 98%   BMI 38.35 kg/m   Physical Exam  Constitutional: He appears well-developed and well-nourished. No distress.  HENT:  Head: Normocephalic and atraumatic.  Eyes: Conjunctivae are normal. Right eye exhibits no discharge. Left eye exhibits no discharge.  Neck: Neck supple.  Cardiovascular: Normal rate, regular rhythm and normal heart sounds. Exam reveals no gallop and no friction rub.  No murmur heard. Pulmonary/Chest: Effort normal and breath sounds normal. No respiratory distress.  Abdominal: Soft. He exhibits no distension. There is no tenderness.  Musculoskeletal: He exhibits no edema or tenderness.       Arms: 4cm laceration in pictured area. Fairly linear with distal end with small thin flap. Through dermis and small area of subcutaneous fat visualized. Viewed through ROM and doesn't  appear to involve deeper structures or have FB. No gross contamination. Active ROM at wrist and with finger flexion all digits. NVI.   Neurological: He is alert.  Skin: Skin is warm and dry.  Psychiatric: He has a normal mood and affect. His behavior is normal. Thought content normal.  Nursing note and vitals reviewed.    ED Treatments / Results  Labs (all labs ordered are listed, but only abnormal results are displayed) Labs Reviewed - No data to display  EKG None  Radiology No results found.  Procedures Procedures (including critical care time)  LACERATION REPAIR Performed by: Raeford RazorStephen Katalia Choma Authorized by: Raeford RazorStephen Dietrich Samuelson Consent: Verbal consent obtained. Risks and benefits: risks, benefits and alternatives were discussed Consent given by: patient Patient identity confirmed: provided demographic data Prepped and Draped in normal sterile fashion Wound explored  Laceration Location: R forearm Laceration Length: 4 cm  No Foreign Bodies seen or palpated  Anesthesia: local infiltration  Local anesthetic: lidocaine 2% w/ epinephrine  Anesthetic total: 2 ml  Irrigation method: syringe Amount of cleaning: copious  Skin closure: 4-0 prolene  Number of sutures: 1 running, 3 simple interrupted   Patient tolerance: Patient tolerated the procedure well with no immediate complications.   Medications Ordered in ED Medications  lidocaine-EPINEPHrine (XYLOCAINE W/EPI) 2 %-1:200000 (PF) injection 5 mL (has no administration in time range)     Initial Impression / Assessment and Plan / ED Course  I have reviewed the triage vital signs and the nursing notes.  Pertinent labs & imaging results that were available during my care of the patient were reviewed by me and considered in my medical decision making (see chart for details).     36yM with uncomplicated laceration. Closed. Advised that thin flap may not be viable. Continued wound care and return precautions discussed.  Tetanus is current.   Final Clinical Impressions(s) / ED Diagnoses   Final diagnoses:  Laceration of right forearm, initial encounter    ED Discharge Orders    None       Raeford RazorKohut, Dayyan Krist, MD 08/22/17 1053

## 2017-08-22 NOTE — ED Triage Notes (Signed)
Pt states he was cutting metal, and the metal got his right forearm. Pt states his last tetanus was 4 years ago. Approx 4cm laceration.

## 2018-02-26 ENCOUNTER — Emergency Department (HOSPITAL_COMMUNITY)
Admission: EM | Admit: 2018-02-26 | Discharge: 2018-02-26 | Disposition: A | Discharge status: Home / Self Care | Payer: Medicare Other | Attending: Emergency Medicine | Admitting: Emergency Medicine

## 2018-02-26 ENCOUNTER — Emergency Department (HOSPITAL_COMMUNITY): Payer: Medicare Other

## 2018-02-26 ENCOUNTER — Other Ambulatory Visit: Payer: Self-pay

## 2018-02-26 ENCOUNTER — Encounter (HOSPITAL_COMMUNITY): Payer: Self-pay | Admitting: Emergency Medicine

## 2018-02-26 DIAGNOSIS — R079 Chest pain, unspecified: Secondary | ICD-10-CM | POA: Diagnosis present

## 2018-02-26 DIAGNOSIS — Z7982 Long term (current) use of aspirin: Secondary | ICD-10-CM | POA: Diagnosis not present

## 2018-02-26 DIAGNOSIS — I10 Essential (primary) hypertension: Secondary | ICD-10-CM | POA: Diagnosis not present

## 2018-02-26 DIAGNOSIS — Z79899 Other long term (current) drug therapy: Secondary | ICD-10-CM | POA: Diagnosis not present

## 2018-02-26 DIAGNOSIS — I493 Ventricular premature depolarization: Secondary | ICD-10-CM | POA: Diagnosis not present

## 2018-02-26 DIAGNOSIS — F17210 Nicotine dependence, cigarettes, uncomplicated: Secondary | ICD-10-CM | POA: Diagnosis not present

## 2018-02-26 LAB — CBC
HCT: 44.1 % (ref 39.0–52.0)
HEMOGLOBIN: 14.6 g/dL (ref 13.0–17.0)
MCH: 30.4 pg (ref 26.0–34.0)
MCHC: 33.1 g/dL (ref 30.0–36.0)
MCV: 91.7 fL (ref 80.0–100.0)
Platelets: 381 10*3/uL (ref 150–400)
RBC: 4.81 MIL/uL (ref 4.22–5.81)
RDW: 12.8 % (ref 11.5–15.5)
WBC: 10.1 10*3/uL (ref 4.0–10.5)
nRBC: 0 % (ref 0.0–0.2)

## 2018-02-26 LAB — DIFFERENTIAL
ABS IMMATURE GRANULOCYTES: 0.05 10*3/uL (ref 0.00–0.07)
Basophils Absolute: 0.1 10*3/uL (ref 0.0–0.1)
Basophils Relative: 1 %
EOS ABS: 0.3 10*3/uL (ref 0.0–0.5)
Eosinophils Relative: 3 %
Immature Granulocytes: 1 %
Lymphocytes Relative: 48 %
Lymphs Abs: 5 10*3/uL — ABNORMAL HIGH (ref 0.7–4.0)
Monocytes Absolute: 0.6 10*3/uL (ref 0.1–1.0)
Monocytes Relative: 6 %
Neutro Abs: 4.1 10*3/uL (ref 1.7–7.7)
Neutrophils Relative %: 41 %

## 2018-02-26 LAB — HEPATIC FUNCTION PANEL
ALT: 29 U/L (ref 0–44)
AST: 26 U/L (ref 15–41)
Albumin: 4 g/dL (ref 3.5–5.0)
Alkaline Phosphatase: 79 U/L (ref 38–126)
BILIRUBIN TOTAL: 0.4 mg/dL (ref 0.3–1.2)
Bilirubin, Direct: 0.1 mg/dL (ref 0.0–0.2)
Indirect Bilirubin: 0.3 mg/dL (ref 0.3–0.9)
Total Protein: 7.9 g/dL (ref 6.5–8.1)

## 2018-02-26 LAB — BASIC METABOLIC PANEL
Anion gap: 11 (ref 5–15)
BUN: 17 mg/dL (ref 6–20)
CALCIUM: 9.3 mg/dL (ref 8.9–10.3)
CO2: 22 mmol/L (ref 22–32)
Chloride: 103 mmol/L (ref 98–111)
Creatinine, Ser: 1.02 mg/dL (ref 0.61–1.24)
GFR calc Af Amer: >60 mL/min (ref >60)
GLUCOSE: 117 mg/dL — AB (ref 70–99)
Potassium: 3.4 mmol/L — ABNORMAL LOW (ref 3.5–5.1)
Sodium: 136 mmol/L (ref 135–145)

## 2018-02-26 LAB — TROPONIN I

## 2018-02-26 MED ORDER — SODIUM CHLORIDE 0.9% FLUSH
3.0000 mL | Freq: Once | INTRAVENOUS | Status: AC
  Administered 2018-02-26: 3 mL via INTRAVENOUS

## 2018-02-26 MED ORDER — POTASSIUM CHLORIDE CRYS ER 20 MEQ PO TBCR
40.0000 meq | EXTENDED_RELEASE_TABLET | Freq: Once | ORAL | Status: AC
  Administered 2018-02-26: 40 meq via ORAL
  Filled 2018-02-26: qty 2

## 2018-02-26 MED ORDER — MORPHINE SULFATE (PF) 2 MG/ML IV SOLN
2.0000 mg | Freq: Once | INTRAVENOUS | Status: AC
  Administered 2018-02-26: 2 mg via INTRAVENOUS
  Filled 2018-02-26: qty 1

## 2018-02-26 MED ORDER — METOPROLOL TARTRATE 5 MG/5ML IV SOLN
2.5000 mg | Freq: Once | INTRAVENOUS | Status: AC
  Administered 2018-02-26: 2.5 mg via INTRAVENOUS
  Filled 2018-02-26: qty 5

## 2018-02-26 NOTE — ED Triage Notes (Signed)
Patient c/o mid-sternal chest pain that radiates shoulder and middle of back. Patient states palpitation, in which feels like heart stops for 4 seconds and short of breath. Per patient pain and episodes are become more frequent. Denies any cardiac hx. Denies any nausea, vomiting, or dizziness.

## 2018-02-26 NOTE — Discharge Instructions (Addendum)
Stop all caffeine alcohol and cigarettes so to help prevent having these extra beats.  Follow-up with your primary care doctor as planned this week

## 2018-02-26 NOTE — ED Provider Notes (Signed)
[facility] EMERGENCY DEPARTMENT Provider Note   CSN: [id number] Arrival date & time: 02/26/18  1831     History   Chief Complaint Chief Complaint  Patient presents with  . Chest Pain    HPI Mark Goodman is a 38 y.o. male.  Patient states that he felt like his heart is been stopping for about 4 seconds.  Also he has been having some discomfort in his chest  The history is provided by the patient. No language interpreter was used.  Chest Pain  Pain location:  L chest Pain quality: aching   Pain radiates to:  Does not radiate Pain severity:  Mild Onset quality:  Sudden Timing:  Rare Progression:  Waxing and waning Chronicity:  New Context: not breathing   Relieved by:  Nothing Worsened by:  Nothing Ineffective treatments:  None tried Associated symptoms: no abdominal pain, no back pain, no cough, no fatigue and no headache     Past Medical History:  Diagnosis Date  . ADHD (attention deficit hyperactivity disorder)   . Bipolar 1 disorder (HCC)   . Cleft uvula   . Hypertension   . PTSD (post-traumatic stress disorder)     There are no active problems to display for this patient.   Past Surgical History:  Procedure Laterality Date  . EXTERNAL EAR SURGERY          Home Medications    Prior to Admission medications   Medication Sig Start Date End Date Taking? Authorizing Provider  acetaminophen (TYLENOL) 500 MG tablet Take 1,500 mg by mouth daily as needed for moderate pain.    Yes [provider]  aspirin EC 81 MG tablet Take 81 mg by mouth daily.   Yes [provider]  gemfibrozil (LOPID) 600 MG tablet Take 1 tablet by mouth 2 (two) times daily. 05/13/15  Yes [provider]  hydrochlorothiazide (HYDRODIURIL) 25 MG tablet Take 25 mg by mouth daily. 07/16/17  Yes [provider]  hydrOXYzine (ATARAX/VISTARIL) 25 MG tablet Take 25-50 mg by mouth at bedtime as needed for sleep. 07/27/17  Yes [provider]    metoprolol succinate (TOPROL-XL) 25 MG 24 hr tablet Take 1 tablet by mouth daily. 05/31/15  Yes [provider]  omeprazole (PRILOSEC) 20 MG capsule Take 20 mg by mouth daily.   Yes [provider]  propranolol (INDERAL) 20 MG tablet Take 20 mg by mouth 2 (two) times daily. 07/16/17  Yes [provider]  doxycycline (VIBRAMYCIN) 100 MG capsule Take 1 capsule (100 mg total) by mouth 2 (two) times daily. One po bid x 7 days Patient not taking: Reported on 08/22/2017 06/02/17   Mancel Bale, MD  neomycin-polymyxin-hydrocortisone (CORTISPORIN) 3.5-10000-1 otic suspension Place 4 drops into the right ear 4 (four) times daily. For 10 days Patient not taking: Reported on 08/22/2017 06/07/16   Triplett, Tammy, PA-C  predniSONE (DELTASONE) 20 MG tablet Take 1 tablet (20 mg total) by mouth 2 (two) times daily. Patient not taking: Reported on 08/22/2017 06/02/17   Mancel Bale, MD    Family History History reviewed. No pertinent family history.  Social History Social History   Tobacco Use  . Smoking status: Current Every Day Smoker    Packs/day: 1.00    Years: 20.00    Pack years: 20.00    Types: Cigarettes  . Smokeless tobacco: Never Used  Substance Use Topics  . Alcohol use: No  . Drug use: No     Allergies   Tomato and  Penicillins   Review of Systems Review of Systems  Constitutional: Negative for appetite change and fatigue.  HENT: Negative for congestion, ear discharge and sinus pressure.   Eyes: Negative for discharge.  Respiratory: Negative for cough.   Cardiovascular: Positive for chest pain.  Gastrointestinal: Negative for abdominal pain and diarrhea.  Genitourinary: Negative for frequency and hematuria.  Musculoskeletal: Negative for back pain.  Skin: Negative for rash.  Neurological: Negative for seizures and headaches.  Psychiatric/Behavioral: Negative for hallucinations.     Physical Exam Updated Vital Signs BP 113/80   Pulse 70   Temp  97.6 F (36.4 C) (Oral)   Resp 17   Ht 5\' 11"  (1.803 m)   Wt 133.8 kg   SpO2 96%   BMI 41.14 kg/m   Physical Exam Vitals signs and nursing note reviewed.  Constitutional:      Appearance: He is well-developed.  HENT:     Head: Normocephalic.  Eyes:     General: No scleral icterus.    Conjunctiva/sclera: Conjunctivae normal.  Neck:     Musculoskeletal: Neck supple.     Thyroid: No thyromegaly.  Cardiovascular:     Rate and Rhythm: Normal rate.     Heart sounds: No murmur. No friction rub. No gallop.      Comments: Patient in normal sinus rhythm but having occasional PVC Pulmonary:     Breath sounds: No stridor. No wheezing or rales.  Chest:     Chest wall: No tenderness.  Abdominal:     General: There is no distension.     Tenderness: There is no abdominal tenderness. There is no rebound.  Musculoskeletal: Normal range of motion.  Lymphadenopathy:     Cervical: No cervical adenopathy.  Skin:    Findings: No erythema or rash.  Neurological:     Mental Status: He is oriented to person, place, and time.     Motor: No abnormal muscle tone.     Coordination: Coordination normal.  Psychiatric:        Behavior: Behavior normal.      ED Treatments / Results  Labs (all labs ordered are listed, but only abnormal results are displayed) Labs Reviewed  BASIC METABOLIC PANEL - Abnormal; Notable for the following components:      Result Value   Potassium 3.4 (*)    Glucose, Bld 117 (*)    All other components within normal limits  DIFFERENTIAL - Abnormal; Notable for the following components:   Lymphs Abs 5.0 (*)    All other components within normal limits  CBC  TROPONIN I  HEPATIC FUNCTION PANEL    EKG None  Radiology Dg Chest 2 View  Result Date: 02/26/2018 CLINICAL DATA:  Chest pain EXAM: CHEST - 2 VIEW COMPARISON:  03/14/2013 FINDINGS: Heart and mediastinal contours are within normal limits. No focal opacities or effusions. No acute bony abnormality.  IMPRESSION: No active cardiopulmonary disease. Electronically Signed   By: Charlett NoseKevin  Dover M.D.   On: 02/26/2018 19:35    Procedures Procedures (including critical care time)  Medications Ordered in ED Medications  sodium chloride flush (NS) 0.9 % injection 3 mL (3 mLs Intravenous Given 02/26/18 1859)  metoprolol tartrate (LOPRESSOR) injection 2.5 mg (2.5 mg Intravenous Given 02/26/18 1900)  morphine 2 MG/ML injection 2 mg (2 mg Intravenous Given 02/26/18 1900)  potassium chloride SA (K-DUR,KLOR-CON) CR tablet 40 mEq (40 mEq Oral Given 02/26/18 2009)     Initial Impression / Assessment and Plan / ED Course  I have  reviewed the triage vital signs and the nursing notes.  Pertinent labs & imaging results that were available during my care of the patient were reviewed by me and considered in my medical decision making (see chart for details).     Labs x-ray EKG unremarkable.  Patient had unifocal PVCs and every time he felt a PVC it gave him the symptom he was talking about.  Patient potassium was slightly low.  He takes beta-blocker for his blood pressure.  I instructed him to stop caffeine alcohol and cigarettes.  He is because he drinks a lot of caffeine.  He will follow-up with his primary care doctor  Final Clinical Impressions(s) / ED Diagnoses   Final diagnoses:  PVC (premature ventricular contraction)    ED Discharge Orders    None       Bethann BerkshireZammit, Averee Harb, MD 02/26/18 2012

## 2019-02-24 IMAGING — DX DG CHEST 2V
2 series · 2 of 2 positions shown · non-contrast
Comparison: 03/14/2013

CLINICAL DATA: Chest pain

EXAM:
CHEST - 2 VIEW

[chest pa]
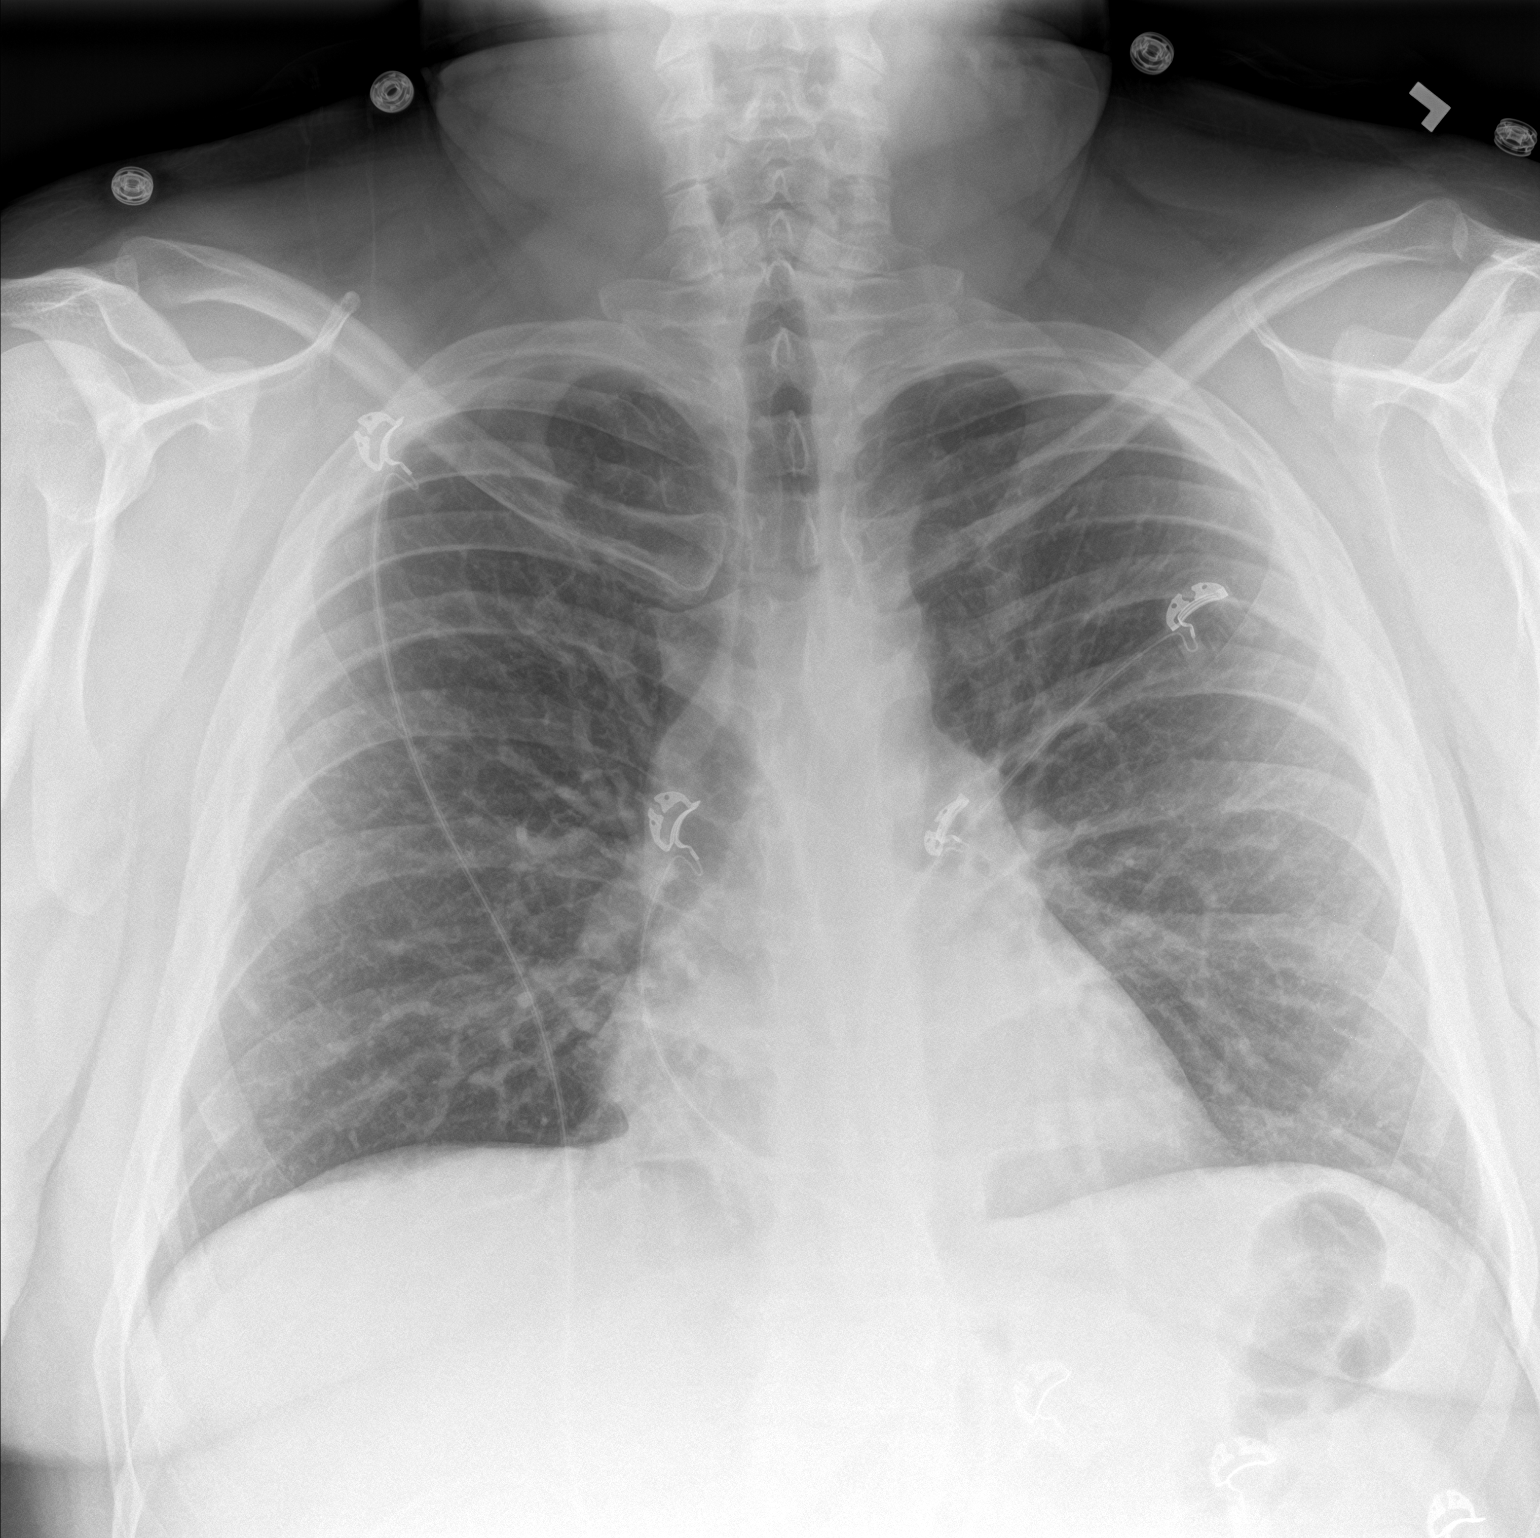

[chest lat]
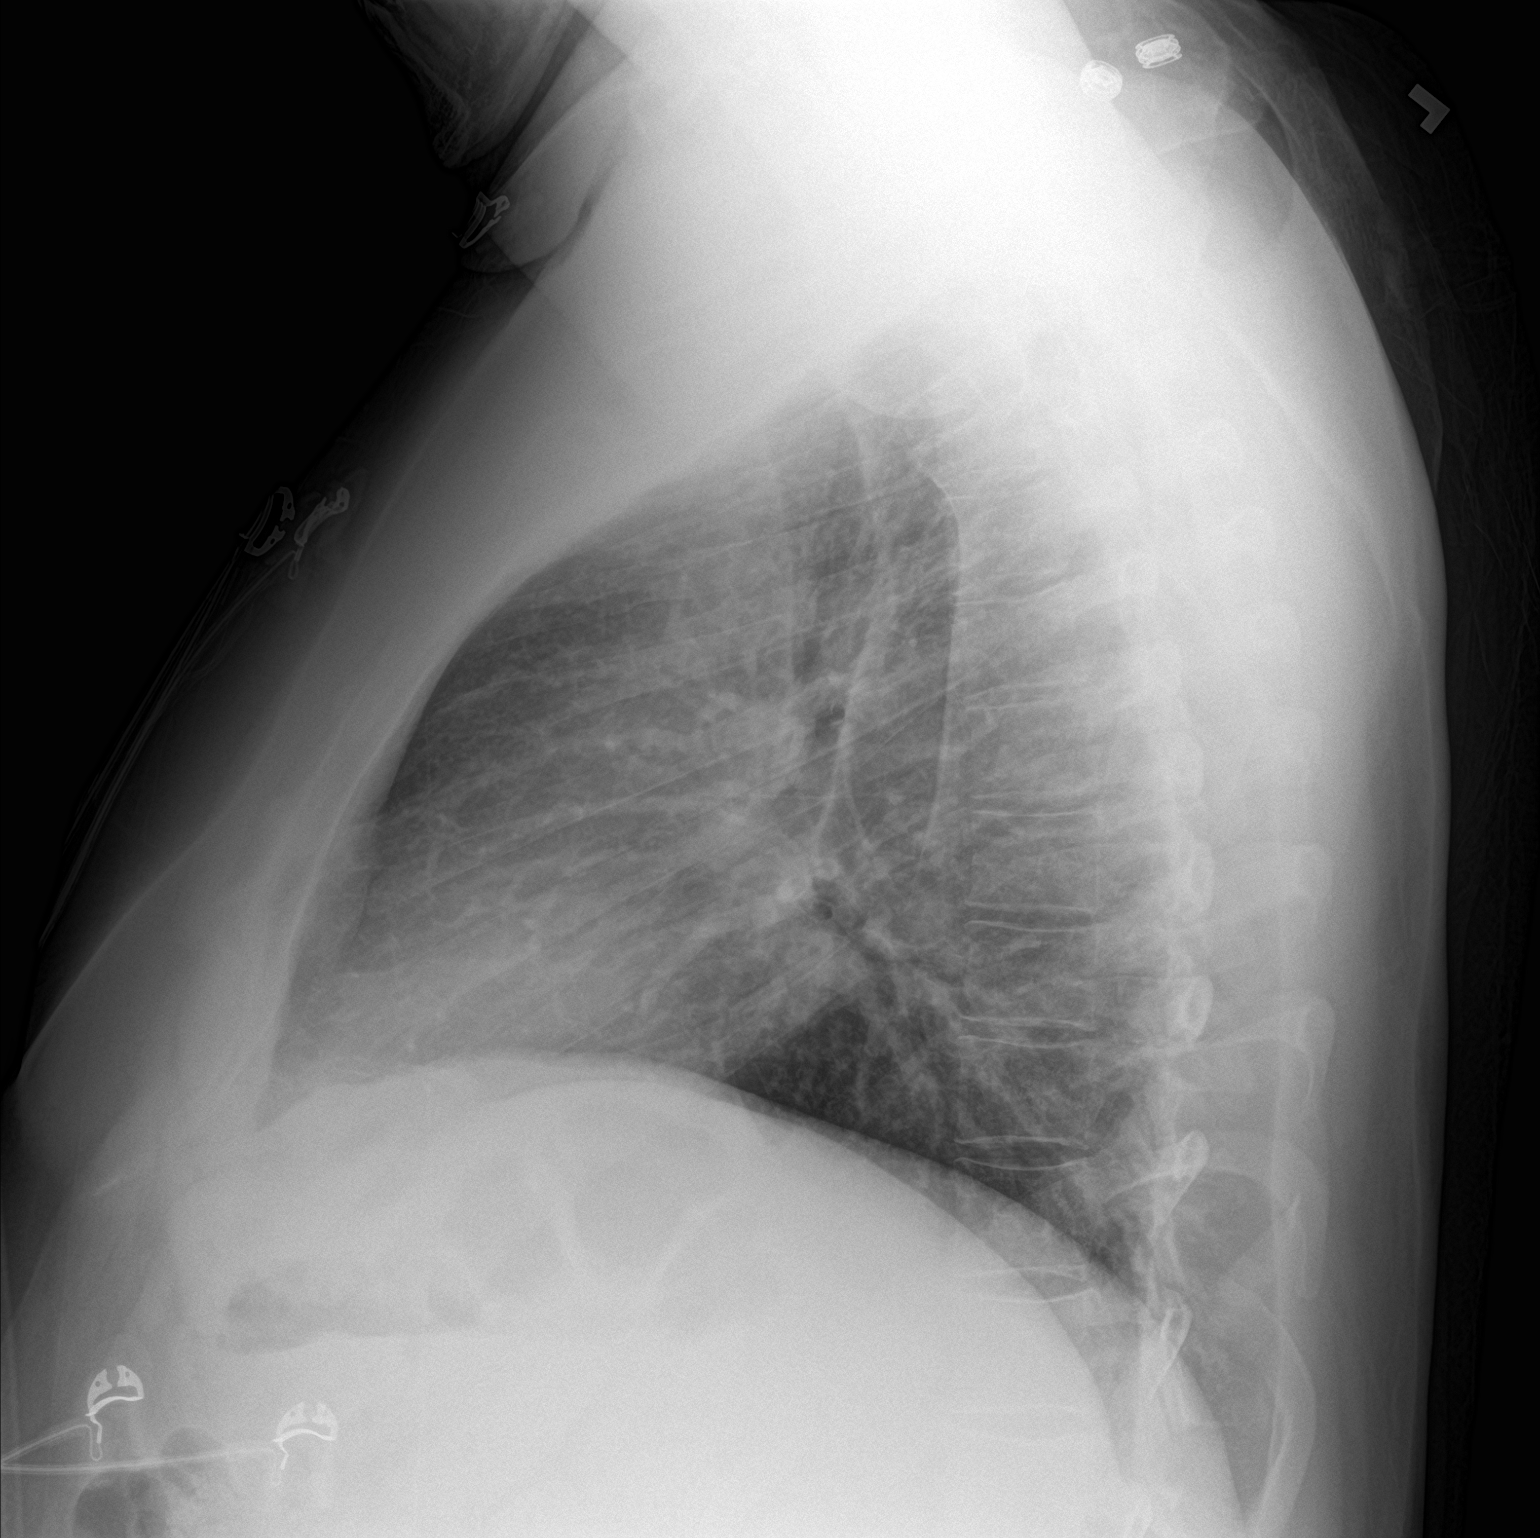

[2 of 2 positions shown; findings below may reference images not displayed]

FINDINGS: Heart and mediastinal contours are within normal limits. No focal
opacities or effusions. No acute bony abnormality.
IMPRESSION: No active cardiopulmonary disease.

## 2019-03-03 ENCOUNTER — Other Ambulatory Visit: Payer: Self-pay

## 2019-03-03 ENCOUNTER — Encounter (HOSPITAL_COMMUNITY): Payer: Self-pay

## 2019-03-03 ENCOUNTER — Emergency Department (HOSPITAL_COMMUNITY)
Admission: EM | Admit: 2019-03-03 | Discharge: 2019-03-03 | Disposition: A | Discharge status: Home / Self Care | Payer: Medicare Other | Attending: Emergency Medicine | Admitting: Emergency Medicine

## 2019-03-03 DIAGNOSIS — Z79899 Other long term (current) drug therapy: Secondary | ICD-10-CM | POA: Insufficient documentation

## 2019-03-03 DIAGNOSIS — K0889 Other specified disorders of teeth and supporting structures: Secondary | ICD-10-CM | POA: Insufficient documentation

## 2019-03-03 DIAGNOSIS — I10 Essential (primary) hypertension: Secondary | ICD-10-CM | POA: Insufficient documentation

## 2019-03-03 DIAGNOSIS — K029 Dental caries, unspecified: Secondary | ICD-10-CM | POA: Diagnosis not present

## 2019-03-03 DIAGNOSIS — F17210 Nicotine dependence, cigarettes, uncomplicated: Secondary | ICD-10-CM | POA: Insufficient documentation

## 2019-03-03 DIAGNOSIS — Z7982 Long term (current) use of aspirin: Secondary | ICD-10-CM | POA: Insufficient documentation

## 2019-03-03 DIAGNOSIS — F909 Attention-deficit hyperactivity disorder, unspecified type: Secondary | ICD-10-CM | POA: Insufficient documentation

## 2019-03-03 MED ORDER — BENZOCAINE 20 % MT PSTE
PASTE | Freq: Once | OROMUCOSAL | Status: DC
  Filled 2019-03-03: qty 11.9

## 2019-03-03 MED ORDER — BUPIVACAINE-EPINEPHRINE (PF) 0.5% -1:200000 IJ SOLN: 1.8000 mL | Freq: Once | INTRAMUSCULAR | Status: DC

## 2019-03-03 MED ORDER — BUPIVACAINE-EPINEPHRINE (PF) 0.5% -1:200000 IJ SOLN
10.0000 mL | Freq: Once | INTRAMUSCULAR | Status: AC
  Administered 2019-03-03: 02:00:00 10 mL

## 2019-03-03 MED ORDER — HYDROCODONE-ACETAMINOPHEN 5-325 MG PO TABS: 1.0000 | ORAL_TABLET | ORAL | 0 refills | Status: DC | PRN

## 2019-03-03 MED ORDER — CLINDAMYCIN HCL 150 MG PO CAPS: 300.0000 mg | ORAL_CAPSULE | Freq: Four times a day (QID) | ORAL | 0 refills | Status: DC

## 2019-03-03 MED ORDER — BUPIVACAINE-EPINEPHRINE (PF) 0.5% -1:200000 IJ SOLN: 10.0000 mL | Freq: Once | INTRAMUSCULAR | Status: DC

## 2019-03-03 MED ORDER — BENZOCAINE 10 % MT GEL
Freq: Once | OROMUCOSAL | Status: DC
  Filled 2019-03-03: qty 9

## 2019-03-03 MED ORDER — CLINDAMYCIN HCL 150 MG PO CAPS
300.0000 mg | ORAL_CAPSULE | Freq: Once | ORAL | Status: AC
  Administered 2019-03-03: 01:00:00 300 mg via ORAL
  Filled 2019-03-03: qty 2

## 2019-03-03 NOTE — ED Notes (Signed)
ED Provider at bedside. 

## 2019-03-03 NOTE — ED Provider Notes (Signed)
[facility] EMERGENCY DEPARTMENT Provider Note   CSN: [id number] Arrival date & time: 03/03/19  0047     History Chief Complaint  Patient presents with  . Dental Pain    Mark Goodman is a 39 y.o. male.  Patient presents to the emergency department for evaluation of dental pain.  Patient complaining of severe left sided facial pain secondary to a tooth ache.  No facial swelling.  No fever.  Pain began earlier today.        Past Medical History:  Diagnosis Date  . ADHD (attention deficit hyperactivity disorder)   . Bipolar 1 disorder (HCC)   . Cleft uvula   . Hypertension   . PTSD (post-traumatic stress disorder)     There are no problems to display for this patient.   Past Surgical History:  Procedure Laterality Date  . EXTERNAL EAR SURGERY         No family history on file.  Social History   Tobacco Use  . Smoking status: Current Every Day Smoker    Packs/day: 1.00    Years: 20.00    Pack years: 20.00    Types: Cigarettes  . Smokeless tobacco: Never Used  Substance Use Topics  . Alcohol use: No  . Drug use: No    Home Medications Prior to Admission medications   Medication Sig Start Date End Date Taking? Authorizing Provider  acetaminophen (TYLENOL) 500 MG tablet Take 1,500 mg by mouth daily as needed for moderate pain.     [provider]  aspirin EC 81 MG tablet Take 81 mg by mouth daily.    [provider]  clindamycin (CLEOCIN) 150 MG capsule Take 2 capsules (300 mg total) by mouth 4 (four) times daily. 03/03/19   Gilda Crease, MD  doxycycline (VIBRAMYCIN) 100 MG capsule Take 1 capsule (100 mg total) by mouth 2 (two) times daily. One po bid x 7 days Patient not taking: Reported on 08/22/2017 06/02/17   Mancel Bale, MD  gemfibrozil (LOPID) 600 MG tablet Take 1 tablet by mouth 2 (two) times daily. 05/13/15   [provider]  hydrochlorothiazide (HYDRODIURIL) 25 MG tablet Take 25 mg by mouth daily. 07/16/17    [provider]  HYDROcodone-acetaminophen (NORCO/VICODIN) 5-325 MG tablet Take 1-2 tablets by mouth every 4 (four) hours as needed. 03/03/19   Gilda Crease, MD  hydrOXYzine (ATARAX/VISTARIL) 25 MG tablet Take 25-50 mg by mouth at bedtime as needed for sleep. 07/27/17   [provider]  metoprolol succinate (TOPROL-XL) 25 MG 24 hr tablet Take 1 tablet by mouth daily. 05/31/15   [provider]  neomycin-polymyxin-hydrocortisone (CORTISPORIN) 3.5-10000-1 otic suspension Place 4 drops into the right ear 4 (four) times daily. For 10 days Patient not taking: Reported on 08/22/2017 06/07/16   Pauline Aus, PA-C  omeprazole (PRILOSEC) 20 MG capsule Take 20 mg by mouth daily.    [provider]  predniSONE (DELTASONE) 20 MG tablet Take 1 tablet (20 mg total) by mouth 2 (two) times daily. Patient not taking: Reported on 08/22/2017 06/02/17   Mancel Bale, MD  propranolol (INDERAL) 20 MG tablet Take 20 mg by mouth 2 (two) times daily. 07/16/17   [provider]    Allergies    Tomato and Penicillins  Review of Systems   Review of Systems  HENT: Positive for dental problem.   All other systems reviewed and are negative.   Physical Exam Updated Vital Signs BP (!) 144/87 (BP Location: Left Arm)  Pulse 81   Temp 98.1 F (36.7 C) (Oral)   Resp 18   Ht 5\' 11"  (1.803 m)   Wt 133.8 kg   SpO2 97%   BMI 41.14 kg/m   Physical Exam Constitutional:      Appearance: Normal appearance.  HENT:     Head: Normocephalic.     Mouth/Throat:     Dentition: Dental tenderness and dental caries present. No dental abscesses.   Neurological:     Mental Status: He is alert.     ED Results / Procedures / Treatments   Labs (all labs ordered are listed, but only abnormal results are displayed) Labs Reviewed - No data to display  EKG None  Radiology No results found.  Procedures Dental Block  Date/Time: 03/03/2019 1:30 AM Performed by: Orpah Greek, MD Authorized by: Orpah Greek, MD   Consent:    Consent obtained:  Verbal   Consent given by:  Patient   Risks discussed:  Allergic reaction, infection, nerve damage, swelling, unsuccessful block and intravascular injection Universal protocol:    Procedure explained and questions answered to patient or proxy's satisfaction: yes     Site/side marked: yes     Immediately prior to procedure, a time out was called: yes   Indications:    Indications: dental pain   Procedure details (see MAR for exact dosages):    Syringe type:  Aspirating dental syringe   Needle gauge:  30 G   Anesthetic injected:  Bupivacaine 0.5% WITH epi   Injection procedure:  Anatomic landmarks identified, introduced needle, incremental injection, negative aspiration for blood and anatomic landmarks palpated Post-procedure details:    Outcome:  Anesthesia achieved   Patient tolerance of procedure:  Tolerated well, no immediate complications   (including critical care time)  Medications Ordered in ED Medications  bupivacaine-epinephrine (MARCAINE W/ EPI) 0.5% -1:200000 injection 10 mL (has no administration in time range)  clindamycin (CLEOCIN) capsule 300 mg (has no administration in time range)  benzocaine (ORAJEL) 10 % mucosal gel (has no administration in time range)    ED Course  I have reviewed the triage vital signs and the nursing notes.  Pertinent labs & imaging results that were available during my care of the patient were reviewed by me and considered in my medical decision making (see chart for details).    MDM Rules/Calculators/A&P                      Dental pain.  Tooth has previously been filled, does have some signs of decay.  No abscess noted. Final Clinical Impression(s) / ED Diagnoses Final diagnoses:  Pain, dental    Rx / DC Orders ED Discharge Orders         Ordered    HYDROcodone-acetaminophen (NORCO/VICODIN) 5-325 MG tablet  Every 4 hours PRN      03/03/19 0128    clindamycin (CLEOCIN) 150 MG capsule  4 times daily     03/03/19 0128           Orpah Greek, MD 03/03/19 0130

## 2019-03-03 NOTE — ED Triage Notes (Signed)
Left lower molar pain, states he thinks it broke off tonight but couldn't find the piece of tooth.

## 2019-03-05 MED FILL — Hydrocodone-Acetaminophen Tab 5-325 MG: ORAL | Qty: 6 | Status: AC

## 2020-03-04 DIAGNOSIS — Z760 Encounter for issue of repeat prescription: Secondary | ICD-10-CM | POA: Diagnosis not present

## 2020-03-04 DIAGNOSIS — R7303 Prediabetes: Secondary | ICD-10-CM | POA: Diagnosis not present

## 2020-03-04 DIAGNOSIS — I10 Essential (primary) hypertension: Secondary | ICD-10-CM | POA: Diagnosis not present

## 2020-04-13 DIAGNOSIS — H5213 Myopia, bilateral: Secondary | ICD-10-CM | POA: Diagnosis not present

## 2020-04-24 ENCOUNTER — Ambulatory Visit (INDEPENDENT_AMBULATORY_CARE_PROVIDER_SITE_OTHER): Payer: Medicare HMO | Admitting: Internal Medicine

## 2020-04-24 ENCOUNTER — Encounter: Payer: Self-pay | Admitting: Internal Medicine

## 2020-04-24 ENCOUNTER — Other Ambulatory Visit: Payer: Self-pay

## 2020-04-24 VITALS — BP 145/95 | HR 86 | Resp 18 | Ht 72.0 in | Wt 318.0 lb

## 2020-04-24 DIAGNOSIS — G43809 Other migraine, not intractable, without status migrainosus: Secondary | ICD-10-CM

## 2020-04-24 DIAGNOSIS — F419 Anxiety disorder, unspecified: Secondary | ICD-10-CM

## 2020-04-24 DIAGNOSIS — K219 Gastro-esophageal reflux disease without esophagitis: Secondary | ICD-10-CM

## 2020-04-24 DIAGNOSIS — I10 Essential (primary) hypertension: Secondary | ICD-10-CM | POA: Insufficient documentation

## 2020-04-24 DIAGNOSIS — Z7689 Persons encountering health services in other specified circumstances: Secondary | ICD-10-CM | POA: Diagnosis not present

## 2020-04-24 DIAGNOSIS — Z114 Encounter for screening for human immunodeficiency virus [HIV]: Secondary | ICD-10-CM | POA: Diagnosis not present

## 2020-04-24 DIAGNOSIS — G43909 Migraine, unspecified, not intractable, without status migrainosus: Secondary | ICD-10-CM | POA: Insufficient documentation

## 2020-04-24 DIAGNOSIS — Z720 Tobacco use: Secondary | ICD-10-CM | POA: Diagnosis not present

## 2020-04-24 DIAGNOSIS — E782 Mixed hyperlipidemia: Secondary | ICD-10-CM | POA: Diagnosis not present

## 2020-04-24 DIAGNOSIS — F319 Bipolar disorder, unspecified: Secondary | ICD-10-CM

## 2020-04-24 DIAGNOSIS — R69 Illness, unspecified: Secondary | ICD-10-CM | POA: Diagnosis not present

## 2020-04-24 DIAGNOSIS — F17210 Nicotine dependence, cigarettes, uncomplicated: Secondary | ICD-10-CM

## 2020-04-24 DIAGNOSIS — Z1159 Encounter for screening for other viral diseases: Secondary | ICD-10-CM | POA: Diagnosis not present

## 2020-04-24 MED ORDER — HYDROXYZINE HCL 50 MG PO TABS
50.0000 mg | ORAL_TABLET | Freq: Every evening | ORAL | 2 refills | Status: DC | PRN
Start: 2020-04-24 — End: 2020-04-24

## 2020-04-24 MED ORDER — OMEPRAZOLE 40 MG PO CPDR: 40.0000 mg | DELAYED_RELEASE_CAPSULE | Freq: Every day | ORAL | 1 refills | Status: DC

## 2020-04-24 MED ORDER — OLMESARTAN MEDOXOMIL-HCTZ 20-12.5 MG PO TABS: 1.0000 | ORAL_TABLET | Freq: Every day | ORAL | 2 refills | Status: DC

## 2020-04-24 MED ORDER — PROPRANOLOL HCL 20 MG PO TABS: 20.0000 mg | ORAL_TABLET | Freq: Two times a day (BID) | ORAL | 3 refills | Status: DC

## 2020-04-24 MED ORDER — HYDROXYZINE HCL 50 MG PO TABS: 50.0000 mg | ORAL_TABLET | Freq: Every evening | ORAL | 2 refills | Status: DC | PRN

## 2020-04-24 NOTE — Patient Instructions (Addendum)
Please start taking medications as prescribed.  Please cut down smoking.  Please follow up with Behavioral health specialist as scheduled.  Please follow DASH diet and perform moderate exercise/walking at least 150 mins/week. [email or web address]">  DASH Eating Plan DASH stands for Dietary Approaches to Stop Hypertension. The DASH eating plan is a healthy eating plan that has been shown to:  Reduce high blood pressure (hypertension).  Reduce your risk for type 2 diabetes, heart disease, and stroke.  Help with weight loss. What are tips for following this plan? Reading food labels  Check food labels for the amount of salt (sodium) per serving. Choose foods with less than 5 percent of the Daily Value of sodium. Generally, foods with less than 300 milligrams (mg) of sodium per serving fit into this eating plan.  To find whole grains, look for the word "whole" as the first word in the ingredient list. Shopping  Buy products labeled as "low-sodium" or "no salt added."  Buy fresh foods. Avoid canned foods and pre-made or frozen meals. Cooking  Avoid adding salt when cooking. Use salt-free seasonings or herbs instead of table salt or sea salt. Check with your health care provider or pharmacist before using salt substitutes.  Do not fry foods. Cook foods using healthy methods such as baking, boiling, grilling, roasting, and broiling instead.  Cook with heart-healthy oils, such as olive, canola, avocado, soybean, or sunflower oil. Meal planning  Eat a balanced diet that includes: ? 4 or more servings of fruits and 4 or more servings of vegetables each day. Try to fill one-half of your plate with fruits and vegetables. ? 6-8 servings of whole grains each day. ? Less than 6 oz (170 g) of lean meat, poultry, or fish each day. A 3-oz (85-g) serving of meat is about the same size as a deck of cards. One egg equals 1 oz (28 g). ? 2-3 servings of  low-fat dairy each day. One serving is 1 cup (237 mL). ? 1 serving of nuts, seeds, or beans 5 times each week. ? 2-3 servings of heart-healthy fats. Healthy fats called omega-3 fatty acids are found in foods such as walnuts, flaxseeds, fortified milks, and eggs. These fats are also found in cold-water fish, such as sardines, salmon, and mackerel.  Limit how much you eat of: ? Canned or prepackaged foods. ? Food that is high in trans fat, such as some fried foods. ? Food that is high in saturated fat, such as fatty meat. ? Desserts and other sweets, sugary drinks, and other foods with added sugar. ? Full-fat dairy products.  Do not salt foods before eating.  Do not eat more than 4 egg yolks a week.  Try to eat at least 2 vegetarian meals a week.  Eat more home-cooked food and less restaurant, buffet, and fast food.   Lifestyle  When eating at a restaurant, ask that your food be prepared with less salt or no salt, if possible.  If you drink alcohol: ? Limit how much you use to:  0-1 drink a day for women who are not pregnant.  0-2 drinks a day for men. ? Be aware of how much alcohol is in your drink. In the [place] one drink equals one 12 oz bottle of beer (355 mL), one 5 oz glass of wine (148 mL), or one 1 oz glass of hard liquor (44 mL). General information  Avoid eating more than 2,300 mg of salt a day. If you have hypertension, you may need  to reduce your sodium intake to 1,500 mg a day.  Work with your health care provider to maintain a healthy body weight or to lose weight. Ask what an ideal weight is for you.  Get at least 30 minutes of exercise that causes your heart to beat faster (aerobic exercise) most days of the week. Activities may include walking, swimming, or biking.  Work with your health care provider or dietitian to adjust your eating plan to your individual calorie needs. What foods should I eat? Fruits All fresh, dried, or frozen fruit. Canned fruit in  natural juice (without added sugar). Vegetables Fresh or frozen vegetables (raw, steamed, roasted, or grilled). Low-sodium or reduced-sodium tomato and vegetable juice. Low-sodium or reduced-sodium tomato sauce and tomato paste. Low-sodium or reduced-sodium canned vegetables. Grains Whole-grain or whole-wheat bread. Whole-grain or whole-wheat pasta. Brown rice. Orpah Cobb. Bulgur. Whole-grain and low-sodium cereals. Pita bread. Low-fat, low-sodium crackers. Whole-wheat flour tortillas. Meats and other proteins Skinless chicken or [place]. Ground chicken or [place]. Pork with fat trimmed off. Fish and seafood. Egg whites. Dried beans, peas, or lentils. Unsalted nuts, nut butters, and seeds. Unsalted canned beans. Lean cuts of beef with fat trimmed off. Low-sodium, lean precooked or cured meat, such as sausages or meat loaves. Dairy Low-fat (1%) or fat-free (skim) milk. Reduced-fat, low-fat, or fat-free cheeses. Nonfat, low-sodium ricotta or cottage cheese. Low-fat or nonfat yogurt. Low-fat, low-sodium cheese. Fats and oils Soft margarine without trans fats. Vegetable oil. Reduced-fat, low-fat, or light mayonnaise and salad dressings (reduced-sodium). Canola, safflower, olive, avocado, soybean, and sunflower oils. Avocado. Seasonings and condiments Herbs. Spices. Seasoning mixes without salt. Other foods Unsalted popcorn and pretzels. Fat-free sweets. The items listed above may not be a complete list of foods and beverages you can eat. Contact a dietitian for more information. What foods should I avoid? Fruits Canned fruit in a light or heavy syrup. Fried fruit. Fruit in cream or butter sauce. Vegetables Creamed or fried vegetables. Vegetables in a cheese sauce. Regular canned vegetables (not low-sodium or reduced-sodium). Regular canned tomato sauce and paste (not low-sodium or reduced-sodium). Regular tomato and vegetable juice (not low-sodium or reduced-sodium). Rosita Fire.  Olives. Grains Baked goods made with fat, such as croissants, muffins, or some breads. Dry pasta or rice meal packs. Meats and other proteins Fatty cuts of meat. Ribs. Fried meat. Tomasa Blase. Bologna, salami, and other precooked or cured meats, such as sausages or meat loaves. Fat from the back of a pig (fatback). Bratwurst. Salted nuts and seeds. Canned beans with added salt. Canned or smoked fish. Whole eggs or egg yolks. Chicken or [place] with skin. Dairy Whole or 2% milk, cream, and half-and-half. Whole or full-fat cream cheese. Whole-fat or sweetened yogurt. Full-fat cheese. Nondairy creamers. Whipped toppings. Processed cheese and cheese spreads. Fats and oils Butter. Stick margarine. Lard. Shortening. Ghee. Bacon fat. Tropical oils, such as coconut, palm kernel, or palm oil. Seasonings and condiments Onion salt, garlic salt, seasoned salt, table salt, and sea salt. Worcestershire sauce. Tartar sauce. Barbecue sauce. Teriyaki sauce. Soy sauce, including reduced-sodium. Steak sauce. Canned and packaged gravies. Fish sauce. Oyster sauce. Cocktail sauce. Store-bought horseradish. Ketchup. Mustard. Meat flavorings and tenderizers. Bouillon cubes. Hot sauces. Pre-made or packaged marinades. Pre-made or packaged taco seasonings. Relishes. Regular salad dressings. Other foods Salted popcorn and pretzels. The items listed above may not be a complete list of foods and beverages you should avoid. Contact a dietitian for more information. Where to find more information  National Heart, Lung, and Blood Institute:  PopSteam.is  American Heart Association: www.heart.org  Academy of Nutrition and Dietetics: www.eatright.org  National Kidney Foundation: www.kidney.org Summary  The DASH eating plan is a healthy eating plan that has been shown to reduce high blood pressure (hypertension). It may also reduce your risk for type 2 diabetes, heart disease, and stroke.  When on the DASH eating plan, aim to  eat more fresh fruits and vegetables, whole grains, lean proteins, low-fat dairy, and heart-healthy fats.  With the DASH eating plan, you should limit salt (sodium) intake to 2,300 mg a day. If you have hypertension, you may need to reduce your sodium intake to 1,500 mg a day.  Work with your health care provider or dietitian to adjust your eating plan to your individual calorie needs. This information is not intended to replace advice given to you by your health care provider. Make sure you discuss any questions you have with your health care provider. Document Revised: 12/08/2018 Document Reviewed: 12/08/2018 Elsevier Patient Education  2021 [employer].

## 2020-04-25 ENCOUNTER — Other Ambulatory Visit: Payer: Self-pay | Admitting: Internal Medicine

## 2020-04-25 DIAGNOSIS — E782 Mixed hyperlipidemia: Secondary | ICD-10-CM

## 2020-04-25 DIAGNOSIS — E559 Vitamin D deficiency, unspecified: Secondary | ICD-10-CM

## 2020-04-25 LAB — HEMOGLOBIN A1C
Est. average glucose Bld gHb Est-mCnc: 131 mg/dL
Hgb A1c MFr Bld: 6.2 % — ABNORMAL HIGH (ref 4.8–5.6)

## 2020-04-25 LAB — LIPID PANEL
Chol/HDL Ratio: 6.5 ratio — ABNORMAL HIGH (ref 0.0–5.0)
Cholesterol, Total: 202 mg/dL — ABNORMAL HIGH (ref 100–199)
HDL: 31 mg/dL — ABNORMAL LOW (ref >39)
LDL Chol Calc (NIH): 128 mg/dL — ABNORMAL HIGH (ref 0–99)
Triglycerides: 238 mg/dL — ABNORMAL HIGH (ref 0–149)
VLDL Cholesterol Cal: 43 mg/dL — ABNORMAL HIGH (ref 5–40)

## 2020-04-25 LAB — CBC
Hematocrit: 44.7 % (ref 37.5–51.0)
Hemoglobin: 15 g/dL (ref 13.0–17.7)
MCH: 29.9 pg (ref 26.6–33.0)
MCHC: 33.6 g/dL (ref 31.5–35.7)
MCV: 89 fL (ref 79–97)
Platelets: 409 10*3/uL (ref 150–450)
RBC: 5.01 x10E6/uL (ref 4.14–5.80)
RDW: 12.9 % (ref 11.6–15.4)
WBC: 10.8 10*3/uL (ref 3.4–10.8)

## 2020-04-25 LAB — CMP14+EGFR
ALT: 83 IU/L — ABNORMAL HIGH (ref 0–44)
AST: 40 IU/L (ref 0–40)
Albumin/Globulin Ratio: 1.4 (ref 1.2–2.2)
Albumin: 4.2 g/dL (ref 4.0–5.0)
Alkaline Phosphatase: 143 IU/L — ABNORMAL HIGH (ref 44–121)
BUN/Creatinine Ratio: 14 (ref 9–20)
BUN: 13 mg/dL (ref 6–24)
Bilirubin Total: 0.2 mg/dL (ref 0.0–1.2)
CO2: 22 mmol/L (ref 20–29)
Calcium: 9.7 mg/dL (ref 8.7–10.2)
Chloride: 99 mmol/L (ref 96–106)
Creatinine, Ser: 0.96 mg/dL (ref 0.76–1.27)
Globulin, Total: 3.1 g/dL (ref 1.5–4.5)
Glucose: 92 mg/dL (ref 65–99)
Potassium: 4.4 mmol/L (ref 3.5–5.2)
Sodium: 139 mmol/L (ref 134–144)
Total Protein: 7.3 g/dL (ref 6.0–8.5)
eGFR: 102 mL/min/{1.73_m2} (ref >59)

## 2020-04-25 LAB — HIV ANTIBODY (ROUTINE TESTING W REFLEX): HIV Screen 4th Generation wRfx: NONREACTIVE

## 2020-04-25 LAB — HEPATITIS C ANTIBODY: Hep C Virus Ab: <0.1 s/co ratio (ref 0.0–0.9)

## 2020-04-25 LAB — TSH: TSH: 1.7 u[IU]/mL (ref 0.450–4.500)

## 2020-04-25 LAB — VITAMIN D 25 HYDROXY (VIT D DEFICIENCY, FRACTURES): Vit D, 25-Hydroxy: 11.9 ng/mL — ABNORMAL LOW (ref 30.0–100.0)

## 2020-04-25 MED ORDER — ATORVASTATIN CALCIUM 20 MG PO TABS: 20.0000 mg | ORAL_TABLET | Freq: Every day | ORAL | 0 refills | Status: DC

## 2020-04-25 MED ORDER — VITAMIN D (ERGOCALCIFEROL) 1.25 MG (50000 UNIT) PO CAPS: 50000.0000 [IU] | ORAL_CAPSULE | ORAL | 5 refills | Status: DC

## 2020-04-25 NOTE — Assessment & Plan Note (Signed)
On Hydroxyzine 25 mg qHS, still has anxiety spells Increased dose to Hydroxyzine 50 mg qHS

## 2020-04-25 NOTE — Progress Notes (Signed)
New Patient Office Visit  Subjective:  Patient ID: Mark Goodman, male    DOB: [birth year]  Age: 40 y.o. MRN: [id number]  CC:  Chief Complaint  Patient presents with  . New Patient (Initial Visit)    New patient 2 weeks has had cough for 2 weeks runny nose congestion     HPI Mark Goodman is a 40 year old male with PMH of HTN, HLD, Bipolar disorder, anxiety, GERD and tobacco abuse who presents for establishing care.  His BP was elevated today as he has run out his medication. He denies any headache, dizziness, chest pain, dyspnea or palpitations.  He has h/o HLD, for which she used to take Gemfibrogil.  He takes Omeprazole 20 mg for GERD and still has acid reflux. Denies dysphagia or odynophagia.  He has h/o bipolar disorder and was on Depakote in the past, but did not tolerate it. 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 He takes Hydroxyzine for anxiety. Denies anhedonia, SI or HI.  He smokes 1 pack/day and states that he would try to cut down.  He has had 1 dose of J&J vaccine.   Past Medical History:  Diagnosis Date  . ADHD (attention deficit hyperactivity disorder)   . Bipolar 1 disorder ([place])   . Cleft uvula   . Hypertension   . PTSD (post-traumatic stress disorder)     Past Surgical History:  Procedure Laterality Date  . EXTERNAL EAR SURGERY      History reviewed. No pertinent family history.  Social History   Socioeconomic History  . Marital status: Married    Spouse name: Not on file  . Number of children: Not on file  . Years of education: Not on file  . Highest education level: Not on file  Occupational History  . Not on file  Tobacco Use  . Smoking status: Current Every Day Smoker    Packs/day: 1.00    Years: 20.00    Pack years: 20.00    Types: Cigarettes  . Smokeless tobacco: Never Used  Vaping Use  . Vaping Use: Never used  Substance and Sexual Activity  . Alcohol use: No  . Drug use: No  . Sexual activity: Not on file  Other Topics  Concern  . Not on file  Social History Narrative  . Not on file   Social Determinants of Health   Financial Resource Strain: Not on file  Food Insecurity: Not on file  Transportation Needs: Not on file  Physical Activity: Not on file  Stress: Not on file  Social Connections: Not on file  Intimate Partner Violence: Not on file    ROS Review of Systems  Constitutional: Negative for chills and fever.  HENT: Negative for congestion and sore throat.   Eyes: Negative for pain and discharge.  Respiratory: Negative for cough and shortness of breath.   Cardiovascular: Negative for chest pain and palpitations.  Gastrointestinal: Negative for constipation, diarrhea, nausea and vomiting.  Endocrine: Negative for polydipsia and polyuria.  Genitourinary: Negative for dysuria and hematuria.  Musculoskeletal: Negative for neck pain and neck stiffness.  Skin: Negative for rash.  Neurological: Negative for dizziness, weakness, numbness and headaches.  Psychiatric/Behavioral: Positive for sleep disturbance. Negative for agitation and behavioral problems. The patient is nervous/anxious.     Objective:   Today's Vitals: BP (!) 145/95 (BP Location: Right Arm, Patient Position: Sitting)   Pulse 86   Resp 18   Ht 6' (1.829 m)   Wt (!) 318 lb (  144.2 kg)   SpO2 96%   BMI 43.13 kg/m   Physical Exam Vitals reviewed.  Constitutional:      General: He is not in acute distress.    Appearance: He is obese. He is not diaphoretic.  HENT:     Head: Normocephalic and atraumatic.     Nose: Nose normal.     Mouth/Throat:     Mouth: Mucous membranes are moist.  Eyes:     General: No scleral icterus.    Extraocular Movements: Extraocular movements intact.     Pupils: Pupils are equal, round, and reactive to light.  Cardiovascular:     Rate and Rhythm: Normal rate and regular rhythm.     Pulses: Normal pulses.     Heart sounds: Normal heart sounds. No murmur heard.   Pulmonary:     Breath  sounds: Normal breath sounds. No wheezing or rales.  Abdominal:     Palpations: Abdomen is soft.     Tenderness: There is no abdominal tenderness.  Musculoskeletal:     Cervical back: Neck supple. No tenderness.     Right lower leg: No edema.     Left lower leg: No edema.  Skin:    General: Skin is warm.     Findings: No rash.  Neurological:     General: No focal deficit present.     Mental Status: He is alert and oriented to person, place, and time.  Psychiatric:        Mood and Affect: Mood is anxious.        Behavior: Behavior normal.        Cognition and Memory: Cognition normal.     Assessment & Plan:   Problem List Items Addressed This Visit      Encounter to establish care - Primary   Care established Previous chart reviewed History and medications reviewed with the patient     Relevant Orders  CBC (Completed)  CMP14+EGFR (Completed)  Hemoglobin A1c (Completed)  Lipid panel (Completed)  TSH (Completed)  Vitamin D (25 hydroxy) (Completed)    Cardiovascular and Mediastinum   Migraine    On Propranolol for ppx Tylenol PRN      Relevant Medications   olmesartan-hydrochlorothiazide (BENICAR HCT) 20-12.5 MG tablet   propranolol (INDERAL) 20 MG tablet   Primary hypertension    BP Readings from Last 1 Encounters:  04/24/20 (!) 145/95   uncontrolled due to noncompliance Started Olmesartan-HCTZ 20-12.5 mg QD Counseled for compliance with the medications Advised DASH diet and moderate exercise/walking, at least 150 mins/week       Relevant Medications   olmesartan-hydrochlorothiazide (BENICAR HCT) 20-12.5 MG tablet   propranolol (INDERAL) 20 MG tablet     Digestive   Gastroesophageal reflux disease    On Omeprazole, increased dose to 40 mg due to suboptimal response      Relevant Medications   omeprazole (PRILOSEC) 40 MG capsule     Other      Bipolar disorder ([place])    Was on Depakote in the past Referred to Psychiatry, emphasized importance to  have close follow up with Psychiatry      Relevant Orders   Ambulatory referral to Psychiatry   Mixed hyperlipidemia    Was On Gemfibrozil Check lipid profile      Relevant Medications   olmesartan-hydrochlorothiazide (BENICAR HCT) 20-12.5 MG tablet   propranolol (INDERAL) 20 MG tablet   Anxiety    On Hydroxyzine 25 mg qHS, still has anxiety spells Increased dose to Hydroxyzine  50 mg qHS      Relevant Medications   hydrOXYzine (ATARAX/VISTARIL) 50 MG tablet   Tobacco abuse    Smokes 1 pack/day  Asked about quitting: confirms that he currently smokes cigarettes Advise to quit smoking: Educated about QUITTING to reduce the risk of cancer, cardio and cerebrovascular disease. Assess willingness: Unwilling to quit at this time, but is working on cutting back. Assist with counseling and pharmacotherapy: Counseled for 5 minutes and literature provided. Arrange for follow up: follow up in 3 months and continue to offer help.       Other Visit Diagnoses    Need for hepatitis C screening test       Relevant Orders   Hepatitis C Antibody (Completed)   Encounter for screening for HIV       Relevant Orders   HIV antibody (with reflex) (Completed)      Outpatient Encounter Medications as of 04/24/2020  Medication Sig  . aspirin EC 81 MG tablet Take 81 mg by mouth daily.  . fluticasone (FLONASE) 50 MCG/ACT nasal spray SMARTSIG:1-2 Spray(s) Both Nares Daily PRN  . [DISCONTINUED] gemfibrozil (LOPID) 600 MG tablet Take 1 tablet by mouth 2 (two) times daily.  . [DISCONTINUED] hydrochlorothiazide (HYDRODIURIL) 25 MG tablet Take 25 mg by mouth daily.  . [DISCONTINUED] hydrOXYzine (ATARAX/VISTARIL) 25 MG tablet Take 25-50 mg by mouth at bedtime as needed for sleep.  . [DISCONTINUED] olmesartan-hydrochlorothiazide (BENICAR HCT) 20-12.5 MG tablet Take 1 tablet by mouth daily.  . [DISCONTINUED] omeprazole (PRILOSEC) 20 MG capsule Take 20 mg by mouth daily.  . [DISCONTINUED] omeprazole  (PRILOSEC) 40 MG capsule Take 1 capsule (40 mg total) by mouth daily.  . [DISCONTINUED] propranolol (INDERAL) 20 MG tablet Take 20 mg by mouth 2 (two) times daily.  . hydrOXYzine (ATARAX/VISTARIL) 50 MG tablet Take 1 tablet (50 mg total) by mouth at bedtime as needed for anxiety.  Marland Kitchen olmesartan-hydrochlorothiazide (BENICAR HCT) 20-12.5 MG tablet Take 1 tablet by mouth daily.  Marland Kitchen omeprazole (PRILOSEC) 40 MG capsule Take 1 capsule (40 mg total) by mouth daily.  . propranolol (INDERAL) 20 MG tablet Take 1 tablet (20 mg total) by mouth 2 (two) times daily.  . [DISCONTINUED] acetaminophen (TYLENOL) 500 MG tablet Take 1,500 mg by mouth daily as needed for moderate pain.  (Patient not taking: Reported on 04/24/2020)  . [DISCONTINUED] clindamycin (CLEOCIN) 150 MG capsule Take 2 capsules (300 mg total) by mouth 4 (four) times daily. (Patient not taking: Reported on 04/24/2020)  . [DISCONTINUED] doxycycline (VIBRAMYCIN) 100 MG capsule Take 1 capsule (100 mg total) by mouth 2 (two) times daily. One po bid x 7 days (Patient not taking: No sig reported)  . [DISCONTINUED] HYDROcodone-acetaminophen (NORCO/VICODIN) 5-325 MG tablet Take 1-2 tablets by mouth every 4 (four) hours as needed. (Patient not taking: Reported on 04/24/2020)  . [DISCONTINUED] hydrOXYzine (ATARAX/VISTARIL) 50 MG tablet Take 1 tablet (50 mg total) by mouth at bedtime as needed for anxiety.  . [DISCONTINUED] metoprolol succinate (TOPROL-XL) 25 MG 24 hr tablet Take 1 tablet by mouth daily. (Patient not taking: Reported on 04/24/2020)  . [DISCONTINUED] neomycin-polymyxin-hydrocortisone (CORTISPORIN) 3.5-10000-1 otic suspension Place 4 drops into the right ear 4 (four) times daily. For 10 days (Patient not taking: No sig reported)  . [DISCONTINUED] predniSONE (DELTASONE) 20 MG tablet Take 1 tablet (20 mg total) by mouth 2 (two) times daily. (Patient not taking: No sig reported)   No facility-administered encounter medications on file as of 04/24/2020.     Follow-up: Return in about  3 months (around 07/24/2020) for HTN and anxiety.   Lindell Spar, MD

## 2020-04-25 NOTE — Assessment & Plan Note (Signed)
Was On Gemfibrozil Check lipid profile 

## 2020-04-25 NOTE — Assessment & Plan Note (Signed)
On Propranolol for ppx Tylenol PRN 

## 2020-04-25 NOTE — Assessment & Plan Note (Signed)
BP Readings from Last 1 Encounters:  04/24/20 (!) 145/95   uncontrolled due to noncompliance Started Olmesartan-HCTZ 20-12.5 mg QD Counseled for compliance with the medications Advised DASH diet and moderate exercise/walking, at least 150 mins/week

## 2020-04-25 NOTE — Assessment & Plan Note (Signed)
Care established Previous chart reviewed History and medications reviewed with the patient 

## 2020-04-25 NOTE — Assessment & Plan Note (Signed)
Was on Depakote in the past Referred to Psychiatry, emphasized importance to have close follow up with Psychiatry

## 2020-04-25 NOTE — Assessment & Plan Note (Signed)
Smokes 1 pack/day  Asked about quitting: confirms that he currently smokes cigarettes Advise to quit smoking: Educated about QUITTING to reduce the risk of cancer, cardio and cerebrovascular disease. Assess willingness: Unwilling to quit at this time, but is working on cutting back. Assist with counseling and pharmacotherapy: Counseled for 5 minutes and literature provided. Arrange for follow up: follow up in 3 months and continue to offer help.

## 2020-04-25 NOTE — Assessment & Plan Note (Signed)
On Omeprazole, increased dose to 40 mg due to suboptimal response

## 2020-05-21 ENCOUNTER — Emergency Department (HOSPITAL_COMMUNITY): Payer: Medicare HMO

## 2020-05-21 ENCOUNTER — Emergency Department (HOSPITAL_COMMUNITY)
Admission: EM | Admit: 2020-05-21 | Discharge: 2020-05-21 | Disposition: A | Discharge status: Home / Self Care | Payer: Medicare HMO | Attending: Emergency Medicine | Admitting: Emergency Medicine

## 2020-05-21 ENCOUNTER — Other Ambulatory Visit: Payer: Self-pay

## 2020-05-21 ENCOUNTER — Encounter (HOSPITAL_COMMUNITY): Payer: Self-pay

## 2020-05-21 DIAGNOSIS — I10 Essential (primary) hypertension: Secondary | ICD-10-CM | POA: Diagnosis not present

## 2020-05-21 DIAGNOSIS — R69 Illness, unspecified: Secondary | ICD-10-CM | POA: Diagnosis not present

## 2020-05-21 DIAGNOSIS — R200 Anesthesia of skin: Secondary | ICD-10-CM | POA: Insufficient documentation

## 2020-05-21 DIAGNOSIS — Z7982 Long term (current) use of aspirin: Secondary | ICD-10-CM | POA: Insufficient documentation

## 2020-05-21 DIAGNOSIS — R0689 Other abnormalities of breathing: Secondary | ICD-10-CM | POA: Diagnosis not present

## 2020-05-21 DIAGNOSIS — R61 Generalized hyperhidrosis: Secondary | ICD-10-CM | POA: Insufficient documentation

## 2020-05-21 DIAGNOSIS — E860 Dehydration: Secondary | ICD-10-CM | POA: Insufficient documentation

## 2020-05-21 DIAGNOSIS — F17210 Nicotine dependence, cigarettes, uncomplicated: Secondary | ICD-10-CM | POA: Diagnosis not present

## 2020-05-21 DIAGNOSIS — Z79899 Other long term (current) drug therapy: Secondary | ICD-10-CM | POA: Diagnosis not present

## 2020-05-21 DIAGNOSIS — R55 Syncope and collapse: Secondary | ICD-10-CM | POA: Diagnosis not present

## 2020-05-21 DIAGNOSIS — Z743 Need for continuous supervision: Secondary | ICD-10-CM | POA: Diagnosis not present

## 2020-05-21 DIAGNOSIS — R42 Dizziness and giddiness: Secondary | ICD-10-CM | POA: Diagnosis not present

## 2020-05-21 LAB — URINALYSIS, ROUTINE W REFLEX MICROSCOPIC
Bacteria, UA: NONE SEEN
Bilirubin Urine: NEGATIVE
Glucose, UA: NEGATIVE mg/dL
Hgb urine dipstick: NEGATIVE
Ketones, ur: NEGATIVE mg/dL
Leukocytes,Ua: NEGATIVE
Nitrite: NEGATIVE
Protein, ur: 100 mg/dL — AB
Specific Gravity, Urine: 1.026 (ref 1.005–1.030)
pH: 6 (ref 5.0–8.0)

## 2020-05-21 LAB — PROTIME-INR
INR: 1 (ref 0.8–1.2)
Prothrombin Time: 13 seconds (ref 11.4–15.2)

## 2020-05-21 LAB — COMPREHENSIVE METABOLIC PANEL
ALT: 31 U/L (ref 0–44)
AST: 23 U/L (ref 15–41)
Albumin: 3.8 g/dL (ref 3.5–5.0)
Alkaline Phosphatase: 110 U/L (ref 38–126)
Anion gap: 8 (ref 5–15)
BUN: 19 mg/dL (ref 6–20)
CO2: 26 mmol/L (ref 22–32)
Calcium: 8.9 mg/dL (ref 8.9–10.3)
Chloride: 102 mmol/L (ref 98–111)
Creatinine, Ser: 1.27 mg/dL — ABNORMAL HIGH (ref 0.61–1.24)
GFR, Estimated: >60 mL/min (ref >60)
Glucose, Bld: 96 mg/dL (ref 70–99)
Potassium: 3.8 mmol/L (ref 3.5–5.1)
Sodium: 136 mmol/L (ref 135–145)
Total Bilirubin: 0.9 mg/dL (ref 0.3–1.2)
Total Protein: 7.2 g/dL (ref 6.5–8.1)

## 2020-05-21 LAB — DIFFERENTIAL
Abs Immature Granulocytes: 0.11 10*3/uL — ABNORMAL HIGH (ref 0.00–0.07)
Basophils Absolute: 0.1 10*3/uL (ref 0.0–0.1)
Basophils Relative: 1 %
Eosinophils Absolute: 0.2 10*3/uL (ref 0.0–0.5)
Eosinophils Relative: 1 %
Immature Granulocytes: 1 %
Lymphocytes Relative: 24 %
Lymphs Abs: 3.5 10*3/uL (ref 0.7–4.0)
Monocytes Absolute: 0.7 10*3/uL (ref 0.1–1.0)
Monocytes Relative: 5 %
Neutro Abs: 10.1 10*3/uL — ABNORMAL HIGH (ref 1.7–7.7)
Neutrophils Relative %: 68 %

## 2020-05-21 LAB — CBC
HCT: 43 % (ref 39.0–52.0)
Hemoglobin: 14.3 g/dL (ref 13.0–17.0)
MCH: 30.9 pg (ref 26.0–34.0)
MCHC: 33.3 g/dL (ref 30.0–36.0)
MCV: 92.9 fL (ref 80.0–100.0)
Platelets: 344 10*3/uL (ref 150–400)
RBC: 4.63 MIL/uL (ref 4.22–5.81)
RDW: 13.2 % (ref 11.5–15.5)
WBC: 14.7 10*3/uL — ABNORMAL HIGH (ref 4.0–10.5)
nRBC: 0 % (ref 0.0–0.2)

## 2020-05-21 LAB — TROPONIN I (HIGH SENSITIVITY): Troponin I (High Sensitivity): 3 ng/L (ref <18)

## 2020-05-21 LAB — APTT: aPTT: 29 seconds (ref 24–36)

## 2020-05-21 LAB — RAPID URINE DRUG SCREEN, HOSP PERFORMED
Amphetamines: NOT DETECTED
Barbiturates: NOT DETECTED
Benzodiazepines: NOT DETECTED
Cocaine: NOT DETECTED
Opiates: NOT DETECTED
Tetrahydrocannabinol: NOT DETECTED

## 2020-05-21 LAB — ETHANOL: Alcohol, Ethyl (B): <10 mg/dL (ref <10)

## 2020-05-21 MED ORDER — SODIUM CHLORIDE 0.9 % IV BOLUS
500.0000 mL | Freq: Once | INTRAVENOUS | Status: AC
  Administered 2020-05-21: 500 mL via INTRAVENOUS

## 2020-05-21 MED ORDER — SODIUM CHLORIDE 0.9 % IV SOLN
100.0000 mL/h | INTRAVENOUS | Status: DC
  Administered 2020-05-21: 100 mL/h via INTRAVENOUS

## 2020-05-21 NOTE — Discharge Instructions (Addendum)
Drink plenty of fluids.  Make sure to stay well-hydrated.  Follow-up with your doctor to be rechecked.  Return as needed for worsening symptoms

## 2020-05-21 NOTE — ED Notes (Signed)
Pt denies any numbness to left arm at present

## 2020-05-21 NOTE — ED Triage Notes (Signed)
Pt presents to ED with complaints of sudden onset of dizziness, and syncopal episode. Pt with left arm numbness. Happened at 1600.

## 2020-05-21 NOTE — ED Notes (Signed)
E-signature pad not working in room 18, pt verbalized understanding of d/c instructions. Pt's wife at bedside as well.

## 2020-05-21 NOTE — ED Notes (Signed)
Lab in room for bloodwork 

## 2020-05-21 NOTE — ED Provider Notes (Signed)
[facility] EMERGENCY DEPARTMENT Provider Note   CSN: [id number] Arrival date & time: 05/21/20  1733     History Chief Complaint  Patient presents with  . Dizziness    Mark Goodman is a 40 y.o. male.  HPI   Patient presented to the ED for evaluation of dizziness and weakness.  Patient states he had been working outside.  He was driving home when he had sudden onset of feeling very lightheaded and he became very diaphoretic.  Patient felt as if he was going to pass out.  He started to have some numbness and tingling around his mouth as well as his left arm.  He denied any focal weakness.  He denies any headache.  He is denied any chest pain.  No shortness of breath.  The symptoms have improved and is now feeling better.    Past Medical History:  Diagnosis Date  . ADHD (attention deficit hyperactivity disorder)   . Bipolar 1 disorder ([facility])   . Cleft uvula   . Hypertension   . PTSD (post-traumatic stress disorder)     Patient Active Problem List   Diagnosis Date Noted  . Encounter to establish care 04/24/2020  . Bipolar disorder ([facility]) 04/24/2020  . Gastroesophageal reflux disease 04/24/2020  . Migraine 04/24/2020  . Mixed hyperlipidemia 04/24/2020  . Primary hypertension 04/24/2020  . Anxiety 04/24/2020  . Tobacco abuse 04/24/2020    Past Surgical History:  Procedure Laterality Date  . EXTERNAL EAR SURGERY         No family history on file.  Social History   Tobacco Use  . Smoking status: Current Every Day Smoker    Packs/day: 1.00    Years: 20.00    Pack years: 20.00    Types: Cigarettes  . Smokeless tobacco: Never Used  Vaping Use  . Vaping Use: Never used  Substance Use Topics  . Alcohol use: No  . Drug use: No    Home Medications Prior to Admission medications   Medication Sig Start Date End Date Taking? Authorizing Provider  aspirin EC 81 MG tablet Take 81 mg by mouth daily.    [provider]  atorvastatin (LIPITOR) 20 MG tablet  Take 1 tablet (20 mg total) by mouth daily. 04/25/20   Anabel Halon, MD  fluticasone Aleda Grana) 50 MCG/ACT nasal spray SMARTSIG:1-2 Spray(s) Both Nares Daily PRN 03/05/20   [provider]  hydrOXYzine (ATARAX/VISTARIL) 50 MG tablet Take 1 tablet (50 mg total) by mouth at bedtime as needed for anxiety. 04/24/20   Anabel Halon, MD  olmesartan-hydrochlorothiazide (BENICAR HCT) 20-12.5 MG tablet Take 1 tablet by mouth daily. 04/24/20   Anabel Halon, MD  omeprazole (PRILOSEC) 40 MG capsule Take 1 capsule (40 mg total) by mouth daily. 04/24/20   Anabel Halon, MD  propranolol (INDERAL) 20 MG tablet Take 1 tablet (20 mg total) by mouth 2 (two) times daily. 04/24/20   Anabel Halon, MD  Vitamin D, Ergocalciferol, (DRISDOL) 1.25 MG (50000 UNIT) CAPS capsule Take 1 capsule (50,000 Units total) by mouth every 7 (seven) days. 04/25/20   Anabel Halon, MD    Allergies    Tomato and Penicillins  Review of Systems   Review of Systems  All other systems reviewed and are negative.   Physical Exam Updated Vital Signs BP 128/68   Pulse 73   Temp (!) 97.5 F (36.4 C) (Oral)   Resp (!) 24   Ht 1.829 m (6')  Wt (!) 142.9 kg   SpO2 97%   BMI 42.72 kg/m   Physical Exam Vitals and nursing note reviewed.  Constitutional:      General: He is not in acute distress.    Appearance: He is well-developed. He is obese. He is not ill-appearing or diaphoretic.  HENT:     Head: Normocephalic and atraumatic.     Right Ear: External ear normal.     Left Ear: External ear normal.  Eyes:     General: No scleral icterus.       Right eye: No discharge.        Left eye: No discharge.     Conjunctiva/sclera: Conjunctivae normal.  Neck:     Trachea: No tracheal deviation.  Cardiovascular:     Rate and Rhythm: Normal rate and regular rhythm.  Pulmonary:     Effort: Pulmonary effort is normal. No respiratory distress.     Breath sounds: Normal breath sounds. No stridor. No wheezing or rales.   Abdominal:     General: Bowel sounds are normal. There is no distension.     Palpations: Abdomen is soft.     Tenderness: There is no abdominal tenderness. There is no guarding or rebound.  Musculoskeletal:        General: No tenderness.     Cervical back: Neck supple.  Skin:    General: Skin is warm and dry.     Findings: No rash.  Neurological:     Mental Status: He is alert and oriented to person, place, and time.     Cranial Nerves: No cranial nerve deficit (No facial droop, extraocular movements intact, tongue midline ).     Sensory: No sensory deficit.     Motor: No abnormal muscle tone or seizure activity.     Coordination: Coordination normal.     Comments: No pronator drift bilateral upper extrem, able to hold both legs off bed for 5 seconds, sensation intact in all extremities, no visual field cuts, no left or right sided neglect, normal finger-nose exam bilaterally, no nystagmus noted      ED Results / Procedures / Treatments   Labs (all labs ordered are listed, but only abnormal results are displayed) Labs Reviewed  CBC - Abnormal; Notable for the following components:      Result Value   WBC 14.7 (*)    All other components within normal limits  DIFFERENTIAL - Abnormal; Notable for the following components:   Neutro Abs 10.1 (*)    Abs Immature Granulocytes 0.11 (*)    All other components within normal limits  COMPREHENSIVE METABOLIC PANEL - Abnormal; Notable for the following components:   Creatinine, Ser 1.27 (*)    All other components within normal limits  URINALYSIS, ROUTINE W REFLEX MICROSCOPIC - Abnormal; Notable for the following components:   Protein, ur 100 (*)    All other components within normal limits  ETHANOL  PROTIME-INR  APTT  RAPID URINE DRUG SCREEN, HOSP PERFORMED  TROPONIN I (HIGH SENSITIVITY)    EKG EKG Interpretation  Date/Time:  Wednesday May 21 2020 17:45:37 EDT Ventricular Rate:  75 PR Interval:  165 QRS Duration: 117 QT  Interval:  379 QTC Calculation: 424 R Axis:   -11 Text Interpretation: Sinus rhythm Incomplete RBBB and LAFB No significant change since last tracing Confirmed by Linwood Dibbles [phone]) on 05/21/2020 5:56:17 PM   Radiology CT HEAD WO CONTRAST  Result Date: 05/21/2020 CLINICAL DATA:  Episode of dizziness today. EXAM: CT HEAD  WITHOUT CONTRAST TECHNIQUE: Contiguous axial images were obtained from the base of the skull through the vertex without intravenous contrast. COMPARISON:  Brain MRI 04/09/2009. FINDINGS: Brain: No evidence of acute infarction, hemorrhage, hydrocephalus, extra-axial collection or mass lesion/mass effect. Vascular: No hyperdense vessel or unexpected calcification. Skull: Intact.  No focal lesion. Sinuses/Orbits: Negative. Other: None. IMPRESSION: Negative head CT. Electronically Signed   By: Drusilla Kanner M.D.   On: 05/21/2020 19:34    Procedures Procedures   Medications Ordered in ED Medications  sodium chloride 0.9 % bolus 500 mL (0 mLs Intravenous Stopped 05/21/20 1834)    Followed by  0.9 %  sodium chloride infusion (100 mL/hr Intravenous New Bag/Given 05/21/20 1834)    ED Course  I have reviewed the triage vital signs and the nursing notes.  Pertinent labs & imaging results that were available during my care of the patient were reviewed by me and considered in my medical decision making (see chart for details).  Clinical Course as of 05/21/20 2010  Wed May 21, 2020  1944 CBC notable for elevated white blood cell count.  However no signs of infection [JK]  1944 Urinalysis negative [JK]  1944 Creatinine slightly elevated compared to previous.  Suspect component of dehydration. [JK]  1945 CT without acute findings [JK]    Clinical Course User Index [JK] Linwood Dibbles, MD   MDM Rules/Calculators/A&P                          Patient presented to the ED for evaluation of acute episode of lightheadedness and some numbness in his arm.  Symptoms were concerning for the  possibility of near syncope versus TIA/stroke.  On exam the patient has no neurologic deficits.  Overall suspicion for TIA stroke is much lower than a near syncopal episode.  She had been working out in the heat.  His laboratory tests do suggest a component of dehydration and I suspect this was the primary etiology.  It sounds like he might of had some orthostatic type symptoms with his vision graying out.  Patient has improved with IV fluids.  He has had no recurrent symptoms.  Warning signs precautions discussed.  Encouraged hydration especially when working out in the heat. Final Clinical Impression(s) / ED Diagnoses Final diagnoses:  Dehydration  Near syncope    Rx / DC Orders ED Discharge Orders    None       Linwood Dibbles, MD 05/21/20 2011

## 2020-05-21 NOTE — ED Notes (Signed)
Unable to sign MSE. Signature pad not working. Pt verbally agrees.

## 2020-05-29 DIAGNOSIS — F3162 Bipolar disorder, current episode mixed, moderate: Secondary | ICD-10-CM | POA: Diagnosis not present

## 2020-05-29 DIAGNOSIS — Z79899 Other long term (current) drug therapy: Secondary | ICD-10-CM | POA: Diagnosis not present

## 2020-05-29 DIAGNOSIS — Z5181 Encounter for therapeutic drug level monitoring: Secondary | ICD-10-CM | POA: Diagnosis not present

## 2020-05-29 DIAGNOSIS — R69 Illness, unspecified: Secondary | ICD-10-CM | POA: Diagnosis not present

## 2020-05-29 DIAGNOSIS — F1320 Sedative, hypnotic or anxiolytic dependence, uncomplicated: Secondary | ICD-10-CM | POA: Diagnosis not present

## 2020-06-24 ENCOUNTER — Other Ambulatory Visit: Payer: Self-pay | Admitting: Internal Medicine

## 2020-06-24 DIAGNOSIS — F419 Anxiety disorder, unspecified: Secondary | ICD-10-CM

## 2020-06-24 DIAGNOSIS — I10 Essential (primary) hypertension: Secondary | ICD-10-CM

## 2020-06-26 DIAGNOSIS — F3162 Bipolar disorder, current episode mixed, moderate: Secondary | ICD-10-CM | POA: Diagnosis not present

## 2020-06-26 DIAGNOSIS — R69 Illness, unspecified: Secondary | ICD-10-CM | POA: Diagnosis not present

## 2020-07-11 DIAGNOSIS — Z0100 Encounter for examination of eyes and vision without abnormal findings: Secondary | ICD-10-CM | POA: Diagnosis not present

## 2020-07-13 ENCOUNTER — Other Ambulatory Visit: Payer: Self-pay

## 2020-07-13 ENCOUNTER — Emergency Department (HOSPITAL_COMMUNITY)
Admission: EM | Admit: 2020-07-13 | Discharge: 2020-07-13 | Disposition: A | Discharge status: Home / Self Care | Payer: Medicare HMO | Attending: Emergency Medicine | Admitting: Emergency Medicine

## 2020-07-13 ENCOUNTER — Encounter (HOSPITAL_COMMUNITY): Payer: Self-pay

## 2020-07-13 DIAGNOSIS — F17210 Nicotine dependence, cigarettes, uncomplicated: Secondary | ICD-10-CM | POA: Diagnosis not present

## 2020-07-13 DIAGNOSIS — Z20822 Contact with and (suspected) exposure to covid-19: Secondary | ICD-10-CM | POA: Diagnosis not present

## 2020-07-13 DIAGNOSIS — J029 Acute pharyngitis, unspecified: Secondary | ICD-10-CM | POA: Diagnosis not present

## 2020-07-13 DIAGNOSIS — I10 Essential (primary) hypertension: Secondary | ICD-10-CM | POA: Insufficient documentation

## 2020-07-13 DIAGNOSIS — Z79899 Other long term (current) drug therapy: Secondary | ICD-10-CM | POA: Diagnosis not present

## 2020-07-13 DIAGNOSIS — Z7982 Long term (current) use of aspirin: Secondary | ICD-10-CM | POA: Insufficient documentation

## 2020-07-13 DIAGNOSIS — B9789 Other viral agents as the cause of diseases classified elsewhere: Secondary | ICD-10-CM | POA: Diagnosis not present

## 2020-07-13 DIAGNOSIS — R69 Illness, unspecified: Secondary | ICD-10-CM | POA: Diagnosis not present

## 2020-07-13 DIAGNOSIS — R059 Cough, unspecified: Secondary | ICD-10-CM | POA: Diagnosis not present

## 2020-07-13 DIAGNOSIS — J069 Acute upper respiratory infection, unspecified: Secondary | ICD-10-CM | POA: Insufficient documentation

## 2020-07-13 LAB — GROUP A STREP BY PCR: Group A Strep by PCR: NOT DETECTED

## 2020-07-13 LAB — RESP PANEL BY RT-PCR (FLU A&B, COVID) ARPGX2
Influenza A by PCR: NEGATIVE
Influenza B by PCR: NEGATIVE
SARS Coronavirus 2 by RT PCR: NEGATIVE

## 2020-07-13 MED ORDER — DEXAMETHASONE SODIUM PHOSPHATE 10 MG/ML IJ SOLN
10.0000 mg | Freq: Once | INTRAMUSCULAR | Status: AC
  Administered 2020-07-13: 10 mg via INTRAMUSCULAR
  Filled 2020-07-13: qty 1

## 2020-07-13 NOTE — ED Provider Notes (Signed)
Patient signed out to me by previous provider.  Please refer to their note for full HPI.  Briefly this is a 40 year old male who presented with sore throat, cough and fever.  Swabs were pending.  Strep and COVID swab are negative, patient was treated symptomatically with Decadron.  Recommended symptomatic treatment with over-the-counter medication for most likely viral illness.  Patient will be discharged and treated as an outpatient.  Discharge plan and strict return to ED precautions discussed, patient verbalizes understanding and agreement.   Rozelle Logan, DO 07/13/20 [phone]

## 2020-07-13 NOTE — ED Triage Notes (Addendum)
Pt reports cough, fever, nasal congestion and sore throat x 5 days. Pt also has generalized body aches. Reports home covid test yesterday was negative

## 2020-07-13 NOTE — Discharge Instructions (Addendum)
You have been seen and discharged from the emergency department.  Your strep swab was negative.  Your COVID test is pending, follow-up these results in MyChart.  Take Tylenol/ibuprofen as needed for pain/fever control.  Stay well-hydrated.  Use over-the-counter medication for symptom control as needed.  Follow-up with your primary provider for reevaluation and further care. Take home medications as prescribed. If you have any worsening symptoms or further concerns for your health please return to an emergency department for further evaluation.

## 2020-07-13 NOTE — ED Provider Notes (Signed)
[facility] EMERGENCY DEPARTMENT Provider Note   CSN: [id number] Arrival date & time: 07/13/20  6979     History Chief Complaint  Patient presents with   Cough    Fever, sore throat    Mark Goodman is a 40 y.o. male.  HPI     This is a 40 year old male with a history of bipolar disorder, hypertension, obesity who presents with sore throat, cough, fever.  Patient reports 5 days of symptoms.  He reports that his sore throat is most bothersome.  It is painful to swallow.  He reports fever up to 101.2.  He has taken some Tylenol which is helped his fever but has not really helped his sore throat.  He also reports a dry nonproductive cough.  No chest pain, shortness of breath, nausea, vomiting, diarrhea.  No known sick contacts.  He is fully vaccinated against COVID-19.  He did take an at home COVID test which was negative.  Past Medical History:  Diagnosis Date   ADHD (attention deficit hyperactivity disorder)    Bipolar 1 disorder (HCC)    Cleft uvula    Hypertension    PTSD (post-traumatic stress disorder)     Patient Active Problem List   Diagnosis Date Noted   Encounter to establish care 04/24/2020   Bipolar disorder (HCC) 04/24/2020   Gastroesophageal reflux disease 04/24/2020   Migraine 04/24/2020   Mixed hyperlipidemia 04/24/2020   Primary hypertension 04/24/2020   Anxiety 04/24/2020   Tobacco abuse 04/24/2020    Past Surgical History:  Procedure Laterality Date   EXTERNAL EAR SURGERY         No family history on file.  Social History   Tobacco Use   Smoking status: Every Day    Packs/day: 1.00    Years: 20.00    Pack years: 20.00    Types: Cigarettes   Smokeless tobacco: Never  Vaping Use   Vaping Use: Never used  Substance Use Topics   Alcohol use: No   Drug use: No    Home Medications Prior to Admission medications   Medication Sig Start Date End Date Taking? Authorizing Provider  aspirin EC 81 MG tablet Take 81 mg by mouth daily.     [provider]  atorvastatin (LIPITOR) 20 MG tablet Take 1 tablet (20 mg total) by mouth daily. 04/25/20   Anabel Halon, MD  fluticasone Aleda Grana) 50 MCG/ACT nasal spray SMARTSIG:1-2 Spray(s) Both Nares Daily PRN 03/05/20   [provider]  hydrOXYzine (ATARAX/VISTARIL) 50 MG tablet TAKE 1 TABLET BY MOUTH ONCEAT BEDTIME AS NEEDED FOR ANXIETY. 06/24/20   Anabel Halon, MD  olmesartan-hydrochlorothiazide (BENICAR HCT) 20-12.5 MG tablet TAKE 1 TABLET BY MOUTH ONCE A DAY. 06/24/20   Anabel Halon, MD  omeprazole (PRILOSEC) 40 MG capsule Take 1 capsule (40 mg total) by mouth daily. 04/24/20   Anabel Halon, MD  propranolol (INDERAL) 20 MG tablet Take 1 tablet (20 mg total) by mouth 2 (two) times daily. 04/24/20   Anabel Halon, MD  Vitamin D, Ergocalciferol, (DRISDOL) 1.25 MG (50000 UNIT) CAPS capsule Take 1 capsule (50,000 Units total) by mouth every 7 (seven) days. 04/25/20   Anabel Halon, MD    Allergies    Tomato and Penicillins  Review of Systems   Review of Systems  Constitutional:  Positive for fever.  HENT:  Positive for sore throat. Negative for trouble swallowing.   Respiratory:  Positive for cough. Negative for shortness of breath.  Cardiovascular:  Negative for chest pain.  Gastrointestinal:  Negative for abdominal pain, diarrhea, nausea and vomiting.  All other systems reviewed and are negative.  Physical Exam Updated Vital Signs BP 118/67   Pulse 86   Temp 98.9 F (37.2 C) (Oral)   Resp 20   Ht 1.829 m (6')   Wt (!) 142.9 kg   SpO2 97%   BMI 42.73 kg/m   Physical Exam Vitals and nursing note reviewed.  Constitutional:      Appearance: He is well-developed.     Comments: Morbidly obese, nontoxic.  HENT:     Head: Normocephalic and atraumatic.     Nose: Nose normal. No congestion.     Mouth/Throat:     Mouth: Mucous membranes are moist.     Comments: 2+ symmetric bilateral swelling of the tonsils with exudate noted, uvula appears edematous but  is midline, no significant erythema Eyes:     Pupils: Pupils are equal, round, and reactive to light.  Cardiovascular:     Rate and Rhythm: Normal rate and regular rhythm.     Heart sounds: Normal heart sounds. No murmur heard. Pulmonary:     Effort: Pulmonary effort is normal. No respiratory distress.     Breath sounds: Normal breath sounds. No stridor. No wheezing.     Comments: Occasional cough, no respiratory distress Abdominal:     General: Bowel sounds are normal.     Palpations: Abdomen is soft.     Tenderness: There is no abdominal tenderness. There is no rebound.  Musculoskeletal:     Cervical back: Neck supple.     Right lower leg: No edema.     Left lower leg: No edema.  Lymphadenopathy:     Cervical: No cervical adenopathy.  Skin:    General: Skin is warm and dry.  Neurological:     Mental Status: He is alert and oriented to person, place, and time.  Psychiatric:        Mood and Affect: Mood normal.    ED Results / Procedures / Treatments   Labs (all labs ordered are listed, but only abnormal results are displayed) Labs Reviewed  RESP PANEL BY RT-PCR (FLU A&B, COVID) ARPGX2  GROUP A STREP BY PCR    EKG None  Radiology No results found.  Procedures Procedures   Medications Ordered in ED Medications  dexamethasone (DECADRON) injection 10 mg (has no administration in time range)    ED Course  I have reviewed the triage vital signs and the nursing notes.  Pertinent labs & imaging results that were available during my care of the patient were reviewed by me and considered in my medical decision making (see chart for details).    MDM Rules/Calculators/A&P                          Patient presents with sore throat, cough, fever.  Overall nontoxic and vital signs are reassuring.  He is afebrile but reports taking Tylenol this morning.  He has symmetrically edematous tonsils and some tonsillar exudate.  Could be viral in nature including flu or COVID.   His cough would argue against strep pharyngitis although this is also consideration.  Patient was given a dose of Decadron.  COVID, influenza, and flu testing were sent.  Final Clinical Impression(s) / ED Diagnoses Final diagnoses:  Viral URI with cough  Sore throat    Rx / DC Orders ED Discharge Orders  None        Shon Baton, MD 07/13/20 [phone]

## 2020-07-14 ENCOUNTER — Telehealth: Payer: Medicare HMO | Admitting: Nurse Practitioner

## 2020-07-15 ENCOUNTER — Other Ambulatory Visit: Payer: Self-pay | Admitting: Nurse Practitioner

## 2020-07-15 ENCOUNTER — Telehealth (INDEPENDENT_AMBULATORY_CARE_PROVIDER_SITE_OTHER): Payer: Medicare HMO | Admitting: Nurse Practitioner

## 2020-07-15 ENCOUNTER — Telehealth: Payer: Self-pay

## 2020-07-15 ENCOUNTER — Other Ambulatory Visit: Payer: Self-pay

## 2020-07-15 ENCOUNTER — Encounter: Payer: Self-pay | Admitting: Nurse Practitioner

## 2020-07-15 ENCOUNTER — Telehealth: Payer: Self-pay | Admitting: *Deleted

## 2020-07-15 DIAGNOSIS — J069 Acute upper respiratory infection, unspecified: Secondary | ICD-10-CM

## 2020-07-15 MED ORDER — HYDROCOD POLST-CPM POLST ER 10-8 MG/5ML PO SUER: 5.0000 mL | Freq: Two times a day (BID) | ORAL | 0 refills | Status: DC | PRN

## 2020-07-15 MED ORDER — AZITHROMYCIN 250 MG PO TABS: ORAL_TABLET | ORAL | 0 refills | Status: DC

## 2020-07-15 NOTE — Assessment & Plan Note (Signed)
-  Rx. z-pack -Rx. tussionex cough syrup

## 2020-07-15 NOTE — Progress Notes (Signed)
Acute Office Visit  Subjective:    Patient ID: Mark Goodman, male    DOB: [birth year], 40 y.o.   MRN: [id number]  Chief Complaint  Patient presents with   Cough    X 6 days. Went to ER 07/14/20 was given steroid shot.     Cough Associated symptoms include a fever and a sore throat.  Patient is in today for sick visit. Symptoms started 6 days ago. He states he is having a fever and cough, and his cough has been preventing him from sleeping well for 6 days.  ED note from yesterday states: "Patient presents with sore throat, cough, fever.  Overall nontoxic and vital signs are reassuring.  He is afebrile but reports taking Tylenol this morning.  He has symmetrically edematous tonsils and some tonsillar exudate.  Could be viral in nature including flu or COVID.  His cough would argue against strep pharyngitis although this is also consideration.  Patient was given a dose of Decadron.  COVID, influenza, and flu testing were sent... Strep and COVID swab are negative, patient was treated symptomatically with Decadron.  Recommended symptomatic treatment with over-the-counter medication for most likely viral illness.  Patient will be discharged and treated as an outpatient.  Discharge plan and strict return to ED precautions discussed, patient verbalizes understanding and agreement."    Past Medical History:  Diagnosis Date   ADHD (attention deficit hyperactivity disorder)    Bipolar 1 disorder ([place])    Cleft uvula    Hypertension    PTSD (post-traumatic stress disorder)     Past Surgical History:  Procedure Laterality Date   EXTERNAL EAR SURGERY      History reviewed. No pertinent family history.  Social History   Socioeconomic History   Marital status: Married    Spouse name: Not on file   Number of children: Not on file   Years of education: Not on file   Highest education level: Not on file  Occupational History   Not on file  Tobacco Use   Smoking status: Every  Day    Packs/day: 1.00    Years: 20.00    Pack years: 20.00    Types: Cigarettes   Smokeless tobacco: Never  Vaping Use   Vaping Use: Never used  Substance and Sexual Activity   Alcohol use: No   Drug use: No   Sexual activity: Not on file  Other Topics Concern   Not on file  Social History Narrative   Not on file   Social Determinants of Health   Financial Resource Strain: Not on file  Food Insecurity: Not on file  Transportation Needs: Not on file  Physical Activity: Not on file  Stress: Not on file  Social Connections: Not on file  Intimate Partner Violence: Not on file    Outpatient Medications Prior to Visit  Medication Sig Dispense Refill   aspirin EC 81 MG tablet Take 81 mg by mouth daily.     atorvastatin (LIPITOR) 20 MG tablet Take 1 tablet (20 mg total) by mouth daily. 90 tablet 0   fluticasone (FLONASE) 50 MCG/ACT nasal spray SMARTSIG:1-2 Spray(s) Both Nares Daily PRN     hydrOXYzine (ATARAX/VISTARIL) 50 MG tablet TAKE 1 TABLET BY MOUTH ONCEAT BEDTIME AS NEEDED FOR ANXIETY. 30 tablet 0   olmesartan-hydrochlorothiazide (BENICAR HCT) 20-12.5 MG tablet TAKE 1 TABLET BY MOUTH ONCE A DAY. 30 tablet 0   omeprazole (PRILOSEC) 40 MG capsule Take 1 capsule (40 mg total) by mouth  daily. 90 capsule 1   propranolol (INDERAL) 20 MG tablet Take 1 tablet (20 mg total) by mouth 2 (two) times daily. 60 tablet 3   Vitamin D, Ergocalciferol, (DRISDOL) 1.25 MG (50000 UNIT) CAPS capsule Take 1 capsule (50,000 Units total) by mouth every 7 (seven) days. 5 capsule 5   No facility-administered medications prior to visit.      Review of Systems  Constitutional:  Positive for fever.  HENT:  Positive for sore throat.   Respiratory:  Positive for cough.       Objective:    Physical Exam  There were no vitals taken for this visit. Wt Readings from Last 3 Encounters:  07/13/20 (!) 315 lb 0.6 oz (142.9 kg)  05/21/20 (!) 315 lb (142.9 kg)  04/24/20 (!) 318 lb (144.2 kg)     Health Maintenance Due  Topic Date Due   COVID-19 Vaccine (1) Never done   Pneumococcal Vaccine 80-30 Years old (1 - PCV) Never done   TETANUS/TDAP  Never done    There are no preventive care reminders to display for this patient.   Lab Results  Component Value Date   TSH 1.700 04/24/2020   Lab Results  Component Value Date   WBC 14.7 (H) 05/21/2020   HGB 14.3 05/21/2020   HCT 43.0 05/21/2020   MCV 92.9 05/21/2020   PLT 344 05/21/2020   Lab Results  Component Value Date   NA 136 05/21/2020   K 3.8 05/21/2020   CO2 26 05/21/2020   GLUCOSE 96 05/21/2020   BUN 19 05/21/2020   CREATININE 1.27 (H) 05/21/2020   BILITOT 0.9 05/21/2020   ALKPHOS 110 05/21/2020   AST 23 05/21/2020   ALT 31 05/21/2020   PROT 7.2 05/21/2020   ALBUMIN 3.8 05/21/2020   CALCIUM 8.9 05/21/2020   ANIONGAP 8 05/21/2020   EGFR 102 04/24/2020   Lab Results  Component Value Date   CHOL 202 (H) 04/24/2020   Lab Results  Component Value Date   HDL 31 (L) 04/24/2020   Lab Results  Component Value Date   LDLCALC 128 (H) 04/24/2020   Lab Results  Component Value Date   TRIG 238 (H) 04/24/2020   Lab Results  Component Value Date   CHOLHDL 6.5 (H) 04/24/2020   Lab Results  Component Value Date   HGBA1C 6.2 (H) 04/24/2020       Assessment & Plan:   Problem List Items Addressed This Visit       Respiratory   URI (upper respiratory infection) - Primary    -Rx. z-pack -Rx. tussionex cough syrup       Relevant Medications   azithromycin (ZITHROMAX) 250 MG tablet   chlorpheniramine-HYDROcodone (TUSSIONEX PENNKINETIC ER) 10-8 MG/5ML SUER     Meds ordered this encounter  Medications   azithromycin (ZITHROMAX) 250 MG tablet    Sig: Please dispense as a z-pack    Dispense:  6 tablet    Refill:  0   chlorpheniramine-HYDROcodone (TUSSIONEX PENNKINETIC ER) 10-8 MG/5ML SUER    Sig: Take 5 mLs by mouth every 12 (twelve) hours as needed for cough.    Dispense:  115 mL    Refill:   0   Date:  07/15/2020   Location of Patient: Home Location of Provider: Office Consent was obtain for visit to be over via telehealth. I verified that I am speaking with the correct person using two identifiers.  I connected with  Alinda Dooms on 07/15/20 via telephone and  verified that I am speaking with the correct person using two identifiers.   I discussed the limitations of evaluation and management by telemedicine. The patient expressed understanding and agreed to proceed.  Time spent: 5 minutes   Noreene Larsson, NP

## 2020-07-15 NOTE — Telephone Encounter (Signed)
Patient called asked if these medicines can be sent into [address] instead of [address].  azithromycin (ZITHROMAX) 250 MG tablet   chlorpheniramine-HYDROcodone (TUSSIONEX PENNKINETIC ER) 10-8 MG/5ML SUER

## 2020-07-15 NOTE — Telephone Encounter (Signed)
Pt said medication that were called in this morning were sent to wrong pharmacy.   These need to go to [place]

## 2020-07-15 NOTE — Telephone Encounter (Signed)
Sent!

## 2020-07-24 ENCOUNTER — Other Ambulatory Visit: Payer: Self-pay | Admitting: Internal Medicine

## 2020-07-24 ENCOUNTER — Ambulatory Visit: Payer: Medicare HMO | Admitting: Internal Medicine

## 2020-07-24 DIAGNOSIS — E782 Mixed hyperlipidemia: Secondary | ICD-10-CM

## 2020-08-08 ENCOUNTER — Ambulatory Visit: Payer: Medicare HMO | Admitting: Internal Medicine

## 2020-08-18 DIAGNOSIS — F3162 Bipolar disorder, current episode mixed, moderate: Secondary | ICD-10-CM | POA: Diagnosis not present

## 2020-08-18 DIAGNOSIS — F411 Generalized anxiety disorder: Secondary | ICD-10-CM | POA: Diagnosis not present

## 2020-08-18 DIAGNOSIS — R69 Illness, unspecified: Secondary | ICD-10-CM | POA: Diagnosis not present

## 2020-08-25 ENCOUNTER — Other Ambulatory Visit: Payer: Self-pay | Admitting: Internal Medicine

## 2020-08-25 DIAGNOSIS — I10 Essential (primary) hypertension: Secondary | ICD-10-CM

## 2020-09-01 ENCOUNTER — Other Ambulatory Visit: Payer: Self-pay | Admitting: Internal Medicine

## 2020-09-01 DIAGNOSIS — F419 Anxiety disorder, unspecified: Secondary | ICD-10-CM

## 2020-09-01 DIAGNOSIS — G43809 Other migraine, not intractable, without status migrainosus: Secondary | ICD-10-CM

## 2020-09-12 ENCOUNTER — Ambulatory Visit: Payer: Medicare HMO | Admitting: Internal Medicine

## 2020-09-26 ENCOUNTER — Other Ambulatory Visit: Payer: Self-pay | Admitting: Internal Medicine

## 2020-09-26 DIAGNOSIS — I10 Essential (primary) hypertension: Secondary | ICD-10-CM

## 2020-10-01 ENCOUNTER — Other Ambulatory Visit: Payer: Self-pay

## 2020-10-01 ENCOUNTER — Ambulatory Visit (INDEPENDENT_AMBULATORY_CARE_PROVIDER_SITE_OTHER): Payer: Medicare HMO

## 2020-10-01 ENCOUNTER — Ambulatory Visit (INDEPENDENT_AMBULATORY_CARE_PROVIDER_SITE_OTHER): Payer: Medicare HMO | Admitting: Internal Medicine

## 2020-10-01 ENCOUNTER — Encounter: Payer: Self-pay | Admitting: Internal Medicine

## 2020-10-01 VITALS — BP 120/66 | HR 74 | Temp 97.9°F | Ht 72.0 in | Wt 312.0 lb

## 2020-10-01 VITALS — BP 120/66 | HR 74 | Temp 97.9°F | Resp 18 | Ht 72.0 in | Wt 312.0 lb

## 2020-10-01 DIAGNOSIS — Z720 Tobacco use: Secondary | ICD-10-CM | POA: Diagnosis not present

## 2020-10-01 DIAGNOSIS — Z0000 Encounter for general adult medical examination without abnormal findings: Secondary | ICD-10-CM

## 2020-10-01 DIAGNOSIS — G43809 Other migraine, not intractable, without status migrainosus: Secondary | ICD-10-CM

## 2020-10-01 DIAGNOSIS — Z5941 Food insecurity: Secondary | ICD-10-CM

## 2020-10-01 DIAGNOSIS — F17210 Nicotine dependence, cigarettes, uncomplicated: Secondary | ICD-10-CM | POA: Diagnosis not present

## 2020-10-01 DIAGNOSIS — I10 Essential (primary) hypertension: Secondary | ICD-10-CM

## 2020-10-01 DIAGNOSIS — F419 Anxiety disorder, unspecified: Secondary | ICD-10-CM

## 2020-10-01 DIAGNOSIS — F319 Bipolar disorder, unspecified: Secondary | ICD-10-CM | POA: Diagnosis not present

## 2020-10-01 DIAGNOSIS — R69 Illness, unspecified: Secondary | ICD-10-CM | POA: Diagnosis not present

## 2020-10-01 DIAGNOSIS — Z0001 Encounter for general adult medical examination with abnormal findings: Secondary | ICD-10-CM

## 2020-10-01 MED ORDER — HYDROXYZINE HCL 50 MG PO TABS: ORAL_TABLET | ORAL | 2 refills | Status: DC

## 2020-10-01 MED ORDER — OLMESARTAN MEDOXOMIL-HCTZ 20-12.5 MG PO TABS: 1.0000 | ORAL_TABLET | Freq: Every day | ORAL | 5 refills | Status: DC

## 2020-10-01 MED ORDER — PROPRANOLOL HCL 20 MG PO TABS: 20.0000 mg | ORAL_TABLET | Freq: Two times a day (BID) | ORAL | 5 refills | Status: DC

## 2020-10-01 NOTE — Patient Instructions (Signed)
Please continue taking medications as prescribed.  Please continue to follow low salt diet and perform moderate exercise/walking at least 150 mins/week.  Please try to cut down -> quit smoking.  Please get fasting blood tests before the next visit.

## 2020-10-01 NOTE — Patient Instructions (Signed)
[name] , Thank you for taking time to come for your Medicare Wellness Visit. I appreciate your ongoing commitment to your health goals. Please review the following plan we discussed and let me know if I can assist you in the future.   Screening recommendations/referrals: Colonoscopy: Not due until age 42. Recommended yearly ophthalmology/optometry visit for glaucoma screening and checkup Recommended yearly dental visit for hygiene and checkup  Vaccinations: Influenza vaccine: Declined. Pneumococcal vaccine: Not due until age 41  Tdap vaccine: Repeat in 10 years  Shingles vaccine: Not due until age 3    Covid-19: Bring copy of card to next visit.   Advanced directives: Advance directive discussed with you today. Even though you declined this today, please call our office should you change your mind, and we can give you the proper paperwork for you to fill out.   Conditions/risks identified: If you wish to quit smoking, help is available. For free tobacco cessation program offerings call the [facility] at [phone] or Live Well Line at 9122830745. You may also visit [email or web address] or email [email or web address] for more information on other programs.   You may also call 1-800-QUIT-NOW (470-486-4248) or visit www.[place].ch or www.[email or web address] for additional resources on smoking cessation.    Next appointment: Follow up in one year for your annual wellness visit   Preventive Care 40-64 Years, Male Preventive care refers to lifestyle choices and visits with your health care provider that can promote health and wellness. What does preventive care include? A yearly physical exam. This is also called an annual well check. Dental exams once or twice a year. Routine eye exams. Ask your health care provider how often you should have your eyes checked. Personal lifestyle choices, including: Daily care of your teeth and gums. Regular physical  activity. Eating a healthy diet. Avoiding tobacco and drug use. Limiting alcohol use. Practicing safe sex. Taking low-dose aspirin every day starting at age 29. What happens during an annual well check? The services and screenings done by your health care provider during your annual well check will depend on your age, overall health, lifestyle risk factors, and family history of disease. Counseling  Your health care provider may ask you questions about your: Alcohol use. Tobacco use. Drug use. Emotional well-being. Home and relationship well-being. Sexual activity. Eating habits. Work and work Astronomer. Screening  You may have the following tests or measurements: Height, weight, and BMI. Blood pressure. Lipid and cholesterol levels. These may be checked every 5 years, or more frequently if you are over 61 years old. Skin check. Lung cancer screening. You may have this screening every year starting at age 40 if you have a 30-pack-year history of smoking and currently smoke or have quit within the past 15 years. Fecal occult blood test (FOBT) of the stool. You may have this test every year starting at age 52. Flexible sigmoidoscopy or colonoscopy. You may have a sigmoidoscopy every 5 years or a colonoscopy every 10 years starting at age 63. Prostate cancer screening. Recommendations will vary depending on your family history and other risks. Hepatitis C blood test. Hepatitis B blood test. Sexually transmitted disease (STD) testing. Diabetes screening. This is done by checking your blood sugar (glucose) after you have not eaten for a while (fasting). You may have this done every 1-3 years. Discuss your test results, treatment options, and if necessary, the need for more tests with your health care provider. Vaccines  Your health care provider may recommend certain  vaccines, such as: Influenza vaccine. This is recommended every year. Tetanus, diphtheria, and acellular pertussis  (Tdap, Td) vaccine. You may need a Td booster every 10 years. Zoster vaccine. You may need this after age 75. Pneumococcal 13-valent conjugate (PCV13) vaccine. You may need this if you have certain conditions and have not been vaccinated. Pneumococcal polysaccharide (PPSV23) vaccine. You may need one or two doses if you smoke cigarettes or if you have certain conditions. Talk to your health care provider about which screenings and vaccines you need and how often you need them. This information is not intended to replace advice given to you by your health care provider. Make sure you discuss any questions you have with your health care provider. Document Released: 01/31/2015 Document Revised: 09/24/2015 Document Reviewed: 11/05/2014 Elsevier Interactive Patient Education  2017 [employer].  Fall Prevention in the Home Falls can cause injuries. They can happen to people of all ages. There are many things you can do to make your home safe and to help prevent falls. What can I do on the outside of my home? Regularly fix the edges of walkways and driveways and fix any cracks. Remove anything that might make you trip as you walk through a door, such as a raised step or threshold. Trim any bushes or trees on the path to your home. Use bright outdoor lighting. Clear any walking paths of anything that might make someone trip, such as rocks or tools. Regularly check to see if handrails are loose or broken. Make sure that both sides of any steps have handrails. Any raised decks and porches should have guardrails on the edges. Have any leaves, snow, or ice cleared regularly. Use sand or salt on walking paths during winter. Clean up any spills in your garage right away. This includes oil or grease spills. What can I do in the bathroom? Use night lights. Install grab bars by the toilet and in the tub and shower. Do not use towel bars as grab bars. Use non-skid mats or decals in the tub or shower. If  you need to sit down in the shower, use a plastic, non-slip stool. Keep the floor dry. Clean up any water that spills on the floor as soon as it happens. Remove soap buildup in the tub or shower regularly. Attach bath mats securely with double-sided non-slip rug tape. Do not have throw rugs and other things on the floor that can make you trip. What can I do in the bedroom? Use night lights. Make sure that you have a light by your bed that is easy to reach. Do not use any sheets or blankets that are too big for your bed. They should not hang down onto the floor. Have a firm chair that has side arms. You can use this for support while you get dressed. Do not have throw rugs and other things on the floor that can make you trip. What can I do in the kitchen? Clean up any spills right away. Avoid walking on wet floors. Keep items that you use a lot in easy-to-reach places. If you need to reach something above you, use a strong step stool that has a grab bar. Keep electrical cords out of the way. Do not use floor polish or wax that makes floors slippery. If you must use wax, use non-skid floor wax. Do not have throw rugs and other things on the floor that can make you trip. What can I do with my stairs? Do not leave  any items on the stairs. Make sure that there are handrails on both sides of the stairs and use them. Fix handrails that are broken or loose. Make sure that handrails are as long as the stairways. Check any carpeting to make sure that it is firmly attached to the stairs. Fix any carpet that is loose or worn. Avoid having throw rugs at the top or bottom of the stairs. If you do have throw rugs, attach them to the floor with carpet tape. Make sure that you have a light switch at the top of the stairs and the bottom of the stairs. If you do not have them, ask someone to add them for you. What else can I do to help prevent falls? Wear shoes that: Do not have high heels. Have rubber  bottoms. Are comfortable and fit you well. Are closed at the toe. Do not wear sandals. If you use a stepladder: Make sure that it is fully opened. Do not climb a closed stepladder. Make sure that both sides of the stepladder are locked into place. Ask someone to hold it for you, if possible. Clearly mark and make sure that you can see: Any grab bars or handrails. First and last steps. Where the edge of each step is. Use tools that help you move around (mobility aids) if they are needed. These include: Canes. Walkers. Scooters. Crutches. Turn on the lights when you go into a dark area. Replace any light bulbs as soon as they burn out. Set up your furniture so you have a clear path. Avoid moving your furniture around. If any of your floors are uneven, fix them. If there are any pets around you, be aware of where they are. Review your medicines with your doctor. Some medicines can make you feel dizzy. This can increase your chance of falling. Ask your doctor what other things that you can do to help prevent falls. This information is not intended to replace advice given to you by your health care provider. Make sure you discuss any questions you have with your health care provider. Document Released: 10/31/2008 Document Revised: 06/12/2015 Document Reviewed: 02/08/2014 Elsevier Interactive Patient Education  2017 [employer].

## 2020-10-01 NOTE — Assessment & Plan Note (Signed)
Followed by psychiatry On Xanax as needed and Atarax as needed

## 2020-10-01 NOTE — Assessment & Plan Note (Signed)
On Propranolol for ppx Tylenol PRN 

## 2020-10-01 NOTE — Progress Notes (Signed)
Established Patient Office Visit  Subjective:  Patient ID: Mark Goodman, male    DOB: 06/15/1980  Age: 40 y.o. MRN: [id number]  CC:  Chief Complaint  Patient presents with   Follow-up    3 month follow up HTN and anxiety pt is doing well     HPI Mark Goodman is a 40 year old male with PMH of HTN, HLD, Bipolar disorder, anxiety, GERD and tobacco abuse who presents for follow up of his chronic medical conditions.  HTN: BP is well-controlled now. Takes medications regularly. Patient denies headache, dizziness, chest pain, dyspnea or palpitations.  [facility] for bipolar disorder and anxiety.  He is on Abilify, as needed Xanax and BuSpar currently.  He continues to take Atarax as needed for anxiety/insomnia.  He feels better now.  Denies any anhedonia, SI or HI.  He continues to smoke about 1.5 pack/day and states that he used to smoke up to 4 packs/day in the past.  He is willing to cut down slowly.  He denies flu vaccine in the office today.  Past Medical History:  Diagnosis Date   ADHD (attention deficit hyperactivity disorder)    Bipolar 1 disorder (HCC)    Cleft uvula    Hypertension    PTSD (post-traumatic stress disorder)     Past Surgical History:  Procedure Laterality Date   EXTERNAL EAR SURGERY      History reviewed. No pertinent family history.  Social History   Socioeconomic History   Marital status: Married    Spouse name: Not on file   Number of children: 2   Years of education: Not on file   Highest education level: Not on file  Occupational History   Not on file  Tobacco Use   Smoking status: Every Day    Packs/day: 1.00    Years: 20.00    Pack years: 20.00    Types: Cigarettes   Smokeless tobacco: Never  Vaping Use   Vaping Use: Never used  Substance and Sexual Activity   Alcohol use: No   Drug use: No   Sexual activity: Not on file  Other Topics Concern   Not on file  Social History  Narrative   Currently on disability.   2 children   Social Determinants of Health   Financial Resource Strain: Medium Risk   Difficulty of Paying Living Expenses: Somewhat hard  Food Insecurity: Food Insecurity Present   Worried About Programme researcher, broadcasting/film/video in the Last Year: Often true   Barista in the Last Year: Often true  Transportation Needs: No Transportation Needs   Lack of Transportation (Medical): No   Lack of Transportation (Non-Medical): No  Physical Activity: Sufficiently Active   Days of Exercise per Week: 5 days   Minutes of Exercise per Session: 30 min  Stress: Stress Concern Present   Feeling of Stress : Rather much  Social Connections: Socially Integrated   Frequency of Communication with Friends and Family: More than three times a week   Frequency of Social Gatherings with Friends and Family: More than three times a week   Attends Religious Services: More than 4 times per year   Active Member of [employer] or Organizations: Yes   Attends Banker Meetings: Never   Marital Status: Married  Catering manager Violence: Not At Risk   Fear of Current or Ex-Partner: No   Emotionally Abused: No   Physically Abused: No  Sexually Abused: No    Outpatient Medications Prior to Visit  Medication Sig Dispense Refill   ALPRAZolam (XANAX) 1 MG tablet Take 0.5-1 mg by mouth daily as needed.     ARIPiprazole (ABILIFY) 10 MG tablet Take 10 mg by mouth every morning.     aspirin EC 81 MG tablet Take 81 mg by mouth daily.     atorvastatin (LIPITOR) 20 MG tablet TAKE 1 TABLET BY MOUTH ONCE A DAY. 90 tablet 0   busPIRone (BUSPAR) 10 MG tablet Take 10 mg by mouth 3 (three) times daily.     fluticasone (FLONASE) 50 MCG/ACT nasal spray SMARTSIG:1-2 Spray(s) Both Nares Daily PRN     omeprazole (PRILOSEC) 40 MG capsule Take 1 capsule (40 mg total) by mouth daily. 90 capsule 1   Vitamin D, Ergocalciferol, (DRISDOL) 1.25 MG (50000 UNIT) CAPS capsule Take 1 capsule (50,000  Units total) by mouth every 7 (seven) days. 5 capsule 5   azithromycin (ZITHROMAX) 250 MG tablet Please dispense as a z-pack (Patient not taking: No sig reported) 6 tablet 0   chlorpheniramine-HYDROcodone (TUSSIONEX PENNKINETIC ER) 10-8 MG/5ML SUER Take 5 mLs by mouth every 12 (twelve) hours as needed for cough. (Patient not taking: No sig reported) 115 mL 0   hydrOXYzine (ATARAX/VISTARIL) 50 MG tablet TAKE 1 TABLET BY MOUTH ONCE AT BEDTIME AS NEEDED FOR ANXIETY. 30 tablet 0   olmesartan-hydrochlorothiazide (BENICAR HCT) 20-12.5 MG tablet TAKE 1 TABLET BY MOUTH ONCE A DAY. 30 tablet 0   propranolol (INDERAL) 20 MG tablet TAKE 1 TABLET BY MOUTH TWICE A DAY. 60 tablet 0   No facility-administered medications prior to visit.      ROS Review of Systems  Constitutional:  Negative for chills and fever.  HENT:  Negative for congestion and sore throat.   Eyes:  Negative for pain and discharge.  Respiratory:  Negative for cough and shortness of breath.   Cardiovascular:  Negative for chest pain and palpitations.  Gastrointestinal:  Negative for constipation, diarrhea, nausea and vomiting.  Endocrine: Negative for polydipsia and polyuria.  Genitourinary:  Negative for dysuria and hematuria.  Musculoskeletal:  Negative for neck pain and neck stiffness.  Skin:  Negative for rash.  Neurological:  Negative for dizziness, weakness, numbness and headaches.  Psychiatric/Behavioral:  Negative for agitation, behavioral problems and sleep disturbance. The patient is not nervous/anxious.      Objective:    Physical Exam Vitals reviewed.  Constitutional:      General: He is not in acute distress.    Appearance: He is obese. He is not diaphoretic.  HENT:     Head: Normocephalic and atraumatic.     Nose: Nose normal.     Mouth/Throat:     Mouth: Mucous membranes are moist.  Eyes:     General: No scleral icterus.    Extraocular Movements: Extraocular movements intact.  Cardiovascular:     Rate and  Rhythm: Normal rate and regular rhythm.     Pulses: Normal pulses.     Heart sounds: Normal heart sounds. No murmur heard. Pulmonary:     Breath sounds: Normal breath sounds. No wheezing or rales.  Musculoskeletal:     Cervical back: Neck supple. No tenderness.     Right lower leg: No edema.     Left lower leg: No edema.  Skin:    General: Skin is warm.     Findings: No rash.  Neurological:     General: No focal deficit present.  Mental Status: He is alert and oriented to person, place, and time.  Psychiatric:        Mood and Affect: Mood normal.        Behavior: Behavior normal.        Cognition and Memory: Cognition normal.    BP 120/66 (BP Location: Left Arm, Patient Position: Sitting, Cuff Size: Normal)   Pulse 74   Temp 97.9 F (36.6 C) (Oral)   Resp 18   Ht 6' (1.829 m)   Wt (!) 312 lb (141.5 kg)   SpO2 95%   BMI 42.31 kg/m  Wt Readings from Last 3 Encounters:  10/01/20 (!) 312 lb (141.5 kg)  10/01/20 (!) 312 lb (141.5 kg)  07/13/20 (!) 315 lb 0.6 oz (142.9 kg)     Health Maintenance Due  Topic Date Due   COVID-19 Vaccine (1) Never done   Pneumococcal Vaccine 27-13 Years old (1 - PCV) Never done   TETANUS/TDAP  Never done    There are no preventive care reminders to display for this patient.  Lab Results  Component Value Date   TSH 1.700 04/24/2020   Lab Results  Component Value Date   WBC 14.7 (H) 05/21/2020   HGB 14.3 05/21/2020   HCT 43.0 05/21/2020   MCV 92.9 05/21/2020   PLT 344 05/21/2020   Lab Results  Component Value Date   NA 136 05/21/2020   K 3.8 05/21/2020   CO2 26 05/21/2020   GLUCOSE 96 05/21/2020   BUN 19 05/21/2020   CREATININE 1.27 (H) 05/21/2020   BILITOT 0.9 05/21/2020   ALKPHOS 110 05/21/2020   AST 23 05/21/2020   ALT 31 05/21/2020   PROT 7.2 05/21/2020   ALBUMIN 3.8 05/21/2020   CALCIUM 8.9 05/21/2020   ANIONGAP 8 05/21/2020   EGFR 102 04/24/2020   Lab Results  Component Value Date   CHOL 202 (H) 04/24/2020    Lab Results  Component Value Date   HDL 31 (L) 04/24/2020   Lab Results  Component Value Date   LDLCALC 128 (H) 04/24/2020   Lab Results  Component Value Date   TRIG 238 (H) 04/24/2020   Lab Results  Component Value Date   CHOLHDL 6.5 (H) 04/24/2020   Lab Results  Component Value Date   HGBA1C 6.2 (H) 04/24/2020      Assessment & Plan:   Problem List Items Addressed This Visit       Cardiovascular and Mediastinum   Migraine    On Propranolol for ppx Tylenol PRN      Relevant Medications   olmesartan-hydrochlorothiazide (BENICAR HCT) 20-12.5 MG tablet   propranolol (INDERAL) 20 MG tablet   Primary hypertension - Primary    BP Readings from Last 1 Encounters:  10/01/20 120/66  Well-controlled with Olmesartan-HCTZ 20-12.5 mg QD Counseled for compliance with the medications Advised DASH diet and moderate exercise/walking, at least 150 mins/week       Relevant Medications   olmesartan-hydrochlorothiazide (BENICAR HCT) 20-12.5 MG tablet   propranolol (INDERAL) 20 MG tablet   Other Relevant Orders   CBC with Differential/Platelet   CMP14+EGFR   Lipid Profile   Urinalysis     Other   Bipolar disorder (HCC)    [facility] by [facility] On Abilify, BuSpar and Xanax as needed Continue Atarax as needed for anxiety/insomnia      Anxiety    Followed by psychiatry On Xanax as needed and Atarax as needed      Relevant Medications  hydrOXYzine (ATARAX/VISTARIL) 50 MG tablet   Tobacco abuse    Smokes 1.5 pack/day  Asked about quitting: confirms that he currently smokes cigarettes Advise to quit smoking: Educated about QUITTING to reduce the risk of cancer, cardio and cerebrovascular disease. Assess willingness: Unwilling to quit at this time, but is working on cutting back. Assist with counseling and pharmacotherapy: Counseled for 5 minutes and literature provided. Arrange for follow up: follow up in 3 months and continue to offer help.           Meds ordered this encounter  Medications   hydrOXYzine (ATARAX/VISTARIL) 50 MG tablet    Sig: TAKE 1 TABLET BY MOUTH ONCE AT BEDTIME AS NEEDED FOR ANXIETY.    Dispense:  30 tablet    Refill:  2   olmesartan-hydrochlorothiazide (BENICAR HCT) 20-12.5 MG tablet    Sig: Take 1 tablet by mouth daily.    Dispense:  30 tablet    Refill:  5   propranolol (INDERAL) 20 MG tablet    Sig: Take 1 tablet (20 mg total) by mouth 2 (two) times daily.    Dispense:  60 tablet    Refill:  5    Follow-up: Return in about 6 months (around 03/31/2021) for Annual physical.    Lindell Spar, MD

## 2020-10-01 NOTE — Assessment & Plan Note (Signed)
Followed by [facility] On Abilify, BuSpar and Xanax as needed Continue Atarax as needed for anxiety/insomnia 

## 2020-10-01 NOTE — Assessment & Plan Note (Signed)
BP Readings from Last 1 Encounters:  10/01/20 120/66   Well-controlled with Olmesartan-HCTZ 20-12.5 mg QD Counseled for compliance with the medications Advised DASH diet and moderate exercise/walking, at least 150 mins/week

## 2020-10-01 NOTE — Addendum Note (Signed)
Addended by: Venia Carbon K on: 10/01/2020 04:37 PM   Modules accepted: Orders

## 2020-10-01 NOTE — Progress Notes (Signed)
Subjective:   Mark Goodman is a 40 y.o. male who presents for an Initial Medicare Annual Wellness Visit.  Review of Systems     Cardiac Risk Factors include: hypertension;male gender;smoking/ tobacco exposure;obesity (BMI >30kg/m2);sedentary lifestyle     Objective:    Today's Vitals   10/01/20 1506  BP: 120/66  Pulse: 74  Temp: 97.9 F (36.6 C)  Weight: (!) 312 lb (141.5 kg)  Height: 6' (1.829 m)   Body mass index is 42.31 kg/m.  Advanced Directives 10/01/2020 07/13/2020 05/21/2020 02/26/2018 08/22/2017 06/07/2016 06/11/2015  Does Patient Have a Medical Advance Directive? No No No No No No No  Would patient like information on creating a medical advance directive? No - Patient declined No - Patient declined - - No - Patient declined - -    Current Medications (verified) Outpatient Encounter Medications as of 10/01/2020  Medication Sig   aspirin EC 81 MG tablet Take 81 mg by mouth daily.   atorvastatin (LIPITOR) 20 MG tablet TAKE 1 TABLET BY MOUTH ONCE A DAY.   fluticasone (FLONASE) 50 MCG/ACT nasal spray SMARTSIG:1-2 Spray(s) Both Nares Daily PRN   hydrOXYzine (ATARAX/VISTARIL) 50 MG tablet TAKE 1 TABLET BY MOUTH ONCE AT BEDTIME AS NEEDED FOR ANXIETY.   olmesartan-hydrochlorothiazide (BENICAR HCT) 20-12.5 MG tablet TAKE 1 TABLET BY MOUTH ONCE A DAY.   omeprazole (PRILOSEC) 40 MG capsule Take 1 capsule (40 mg total) by mouth daily.   propranolol (INDERAL) 20 MG tablet TAKE 1 TABLET BY MOUTH TWICE A DAY.   Vitamin D, Ergocalciferol, (DRISDOL) 1.25 MG (50000 UNIT) CAPS capsule Take 1 capsule (50,000 Units total) by mouth every 7 (seven) days.   ALPRAZolam (XANAX) 1 MG tablet Take 0.5-1 mg by mouth daily as needed.   ARIPiprazole (ABILIFY) 10 MG tablet Take 10 mg by mouth every morning.   azithromycin (ZITHROMAX) 250 MG tablet Please dispense as a z-pack (Patient not taking: Reported on 10/01/2020)   busPIRone (BUSPAR) 10 MG tablet Take 10 mg by mouth 3 (three) times daily.    chlorpheniramine-HYDROcodone (TUSSIONEX PENNKINETIC ER) 10-8 MG/5ML SUER Take 5 mLs by mouth every 12 (twelve) hours as needed for cough. (Patient not taking: Reported on 10/01/2020)   No facility-administered encounter medications on file as of 10/01/2020.    Allergies (verified) Tomato and Penicillins   History: Past Medical History:  Diagnosis Date   ADHD (attention deficit hyperactivity disorder)    Bipolar 1 disorder (HCC)    Cleft uvula    Hypertension    PTSD (post-traumatic stress disorder)    Past Surgical History:  Procedure Laterality Date   EXTERNAL EAR SURGERY     History reviewed. No pertinent family history. Social History   Socioeconomic History   Marital status: Married    Spouse name: Not on file   Number of children: 2   Years of education: Not on file   Highest education level: Not on file  Occupational History   Not on file  Tobacco Use   Smoking status: Every Day    Packs/day: 1.00    Years: 20.00    Pack years: 20.00    Types: Cigarettes   Smokeless tobacco: Never  Vaping Use   Vaping Use: Never used  Substance and Sexual Activity   Alcohol use: No   Drug use: No   Sexual activity: Not on file  Other Topics Concern   Not on file  Social History Narrative   Currently on disability.   2 children  Social Determinants of Health   Financial Resource Strain: Medium Risk   Difficulty of Paying Living Expenses: Somewhat hard  Food Insecurity: Food Insecurity Present   Worried About Programme researcher, broadcasting/film/video in the Last Year: Often true   Barista in the Last Year: Often true  Transportation Needs: No Transportation Needs   Lack of Transportation (Medical): No   Lack of Transportation (Non-Medical): No  Physical Activity: Sufficiently Active   Days of Exercise per Week: 5 days   Minutes of Exercise per Session: 30 min  Stress: Stress Concern Present   Feeling of Stress : Rather much  Social Connections: Not on file    Tobacco  Counseling Ready to quit: No Counseling given: Not Answered   Clinical Intake:  Pre-visit preparation completed: Yes  Pain : No/denies pain     BMI - recorded: 42.31 Nutritional Status: BMI > 30  Obese Nutritional Risks: None Diabetes: No  How often do you need to have someone help you when you read instructions, pamphlets, or other written materials from your doctor or pharmacy?: 1 - Never  Diabetic?NO  Interpreter Needed?: No  Information entered by :: Mark Carbon, LPN   Activities of Daily Living In your present state of health, do you have any difficulty performing the following activities: 10/01/2020  Hearing? N  Vision? N  Difficulty concentrating or making decisions? Y  Comment Pt has ADHD, currenty on medication.  Walking or climbing stairs? N  Dressing or bathing? N  Doing errands, shopping? N  Preparing Food and eating ? N  Using the Toilet? N  In the past six months, have you accidently leaked urine? N  Do you have problems with loss of bowel control? N  Managing your Medications? N  Managing your Finances? N  Housekeeping or managing your Housekeeping? N  Some recent data might be hidden    Patient Care Team: Mark Halon, MD as PCP - General (Internal Medicine)  Indicate any recent Medical Services you may have received from other than [facility] providers in the past year (date may be approximate).     Assessment:   This is a routine wellness examination for Mark Goodman.  Hearing/Vision screen Hearing Screening - Comments:: Pt does having hearing issues but does not have to wear hearing issues. Better after surgery. Vision Screening - Comments:: Glasses. Up to date on eye exam. Dr. April Goodman.  Dietary issues and exercise activities discussed: Current Exercise Habits: Home exercise routine, Type of exercise: walking, Time (Minutes): 30, Frequency (Times/Week): 5, Weekly Exercise (Minutes/Week): 150, Intensity: Mild, Exercise limited by: cardiac  condition(s)   Goals Addressed             This Visit's Progress    Quit Smoking         Depression Screen PHQ 2/9 Scores 10/01/2020 07/15/2020 04/24/2020  PHQ - 2 Score 0 1 0    Fall Risk Fall Risk  10/01/2020 07/15/2020 04/24/2020  Falls in the past year? 0 0 0  Number falls in past yr: 0 0 0  Injury with Fall? 0 0 0  Risk for fall due to : - No Fall Risks No Fall Risks  Follow up Falls prevention discussed Falls evaluation completed Falls evaluation completed    FALL RISK PREVENTION PERTAINING TO THE HOME:  Any stairs in or around the home? Yes  If so, are there any without handrails? No  Home free of loose throw rugs in walkways, pet beds, electrical cords,  etc? Yes  Adequate lighting in your home to reduce risk of falls? Yes   ASSISTIVE DEVICES UTILIZED TO PREVENT FALLS:  Life alert? No  Use of a cane, walker or w/c? No  Grab bars in the bathroom? No  Shower chair or bench in shower? No  Elevated toilet seat or a handicapped toilet? No   TIMED UP AND GO:  Was the test performed? Yes .  Length of time to ambulate 10 feet: 7 sec.   Gait steady and fast without use of assistive device  Cognitive Function:     6CIT Screen 10/01/2020  What time? 0 points  Count back from 20 0 points  Months in reverse 4 points  Repeat phrase 0 points    Immunizations  There is no immunization history on file for this patient.  TDAP status: Due, Education has been provided regarding the importance of this vaccine. Advised may receive this vaccine at local pharmacy or Health Dept. Aware to provide a copy of the vaccination record if obtained from local pharmacy or Health Dept. Verbalized acceptance and understanding.  Flu Vaccine status: Declined, Education has been provided regarding the importance of this vaccine but patient still declined. Advised may receive this vaccine at local pharmacy or Health Dept. Aware to provide a copy of the vaccination record if obtained from local  pharmacy or Health Dept. Verbalized acceptance and understanding.  Pneumococcal vaccine status: Declined,  Education has been provided regarding the importance of this vaccine but patient still declined. Advised may receive this vaccine at local pharmacy or Health Dept. Aware to provide a copy of the vaccination record if obtained from local pharmacy or Health Dept. Verbalized acceptance and understanding.   Covid-19 vaccine status: Completed vaccines  Qualifies for Shingles Vaccine? No   Zostavax completed No   Shingrix Completed?: No.    Education has been provided regarding the importance of this vaccine. Patient has been advised to call insurance company to determine out of pocket expense if they have not yet received this vaccine. Advised may also receive vaccine at local pharmacy or Health Dept. Verbalized acceptance and understanding.  Screening Tests Health Maintenance  Topic Date Due   COVID-19 Vaccine (1) Never done   Pneumococcal Vaccine 60-44 Years old (1 - PCV) Never done   TETANUS/TDAP  Never done   INFLUENZA VACCINE  Never done   Hepatitis C Screening  Completed   HIV Screening  Completed   HPV VACCINES  Aged Out    Health Maintenance  Health Maintenance Due  Topic Date Due   COVID-19 Vaccine (1) Never done   Pneumococcal Vaccine 39-33 Years old (1 - PCV) Never done   TETANUS/TDAP  Never done   INFLUENZA VACCINE  Never done    Colorectal cancer screening: No longer required. Pt is not yet 48.   Lung Cancer Screening: (Low Dose CT Chest recommended if Age 50-80 years, 30 pack-year currently smoking OR have quit w/in 15years.) does not qualify.     Additional Screening:  Hepatitis C Screening: does not qualify; Completed 04/24/20  Vision Screening: Recommended annual ophthalmology exams for early detection of glaucoma and other disorders of the eye. Is the patient up to date with their annual eye exam?  Yes  Who is the provider or what is the name of the office in  which the patient attends annual eye exams? Dr. April Goodman If pt is not established with a provider, would they like to be referred to a provider to establish  care? No .   Dental Screening: Recommended annual dental exams for proper oral hygiene  Community Resource Referral / Chronic Care Management: CRR required this visit?  Yes   CCM required this visit?  No      Plan:     I have personally reviewed and noted the following in the patient's chart:   Medical and social history Use of alcohol, tobacco or illicit drugs  Current medications and supplements including opioid prescriptions. Patient is not currently taking opioid prescriptions. Functional ability and status Nutritional status Physical activity Advanced directives List of other physicians Hospitalizations, surgeries, and ER visits in previous 12 months Vitals Screenings to include cognitive, depression, and falls Referrals and appointments  In addition, I have reviewed and discussed with patient certain preventive protocols, quality metrics, and best practice recommendations. A written personalized care plan for preventive services as well as general preventive health recommendations were provided to patient.     Darral Dash, LPN   08/28/313   Nurse Notes: Pt states he and wife are having issues with food insecurity. Offered to make [facility] referral and patient is agreeable. Order sent. Pt states he has had Covid and Tetanus vaccines. Advised pt to bring copy of records to next visit, so they can be entered into his chart. Pt verbalized understanding of all.  Performed 6CIT. Pt scored 4 but declined to do months of year.

## 2020-10-01 NOTE — Assessment & Plan Note (Signed)
Smokes 1.5 pack/day  Asked about quitting: confirms that he currently smokes cigarettes Advise to quit smoking: Educated about QUITTING to reduce the risk of cancer, cardio and cerebrovascular disease. Assess willingness: Unwilling to quit at this time, but is working on cutting back. Assist with counseling and pharmacotherapy: Counseled for 5 minutes and literature provided. Arrange for follow up: follow up in 3 months and continue to offer help. 

## 2020-10-08 ENCOUNTER — Telehealth: Payer: Self-pay

## 2020-10-08 NOTE — Telephone Encounter (Signed)
   Telephone encounter was:  Unsuccessful.  10/08/2020 Name: Mark Goodman MRN: [id number] DOB: 04-16-1980  Unsuccessful outbound call made today to assist with:  Food Insecurity  Outreach Attempt:  1st Attempt  A HIPAA compliant voice message was left requesting a return call.  Instructed patient to call back at [phone].  Laylee Schooley, AAS Paralegal, [facility] Care Guide  Embedded Care Coordination [place]  Care Management  [address] [place], [place] [address] ??[email or web address]  ?? [phone]   [email or web address]

## 2020-10-14 ENCOUNTER — Telehealth: Payer: Self-pay

## 2020-10-14 NOTE — Telephone Encounter (Signed)
   Telephone encounter was:  Unsuccessful.  10/14/2020 Name: Jerime Arif MRN: [id number] DOB: October 15, 1980  Unsuccessful outbound call made today to assist with:  Unable to leave message voicemail did not pick-up. Letter saved in Epic.  Outreach Attempt:  2nd Attempt   Jisela Merlino, AAS Paralegal, [facility] Care Guide  Embedded Care Coordination [place]  Care Management  [address] [place], [place] [address] ??[email or web address]  ?? [phone]   [email or web address]

## 2020-10-16 ENCOUNTER — Other Ambulatory Visit: Payer: Self-pay | Admitting: Internal Medicine

## 2020-10-16 DIAGNOSIS — E782 Mixed hyperlipidemia: Secondary | ICD-10-CM

## 2020-10-18 ENCOUNTER — Other Ambulatory Visit: Payer: Self-pay | Admitting: Internal Medicine

## 2020-10-18 DIAGNOSIS — K219 Gastro-esophageal reflux disease without esophagitis: Secondary | ICD-10-CM

## 2020-10-20 ENCOUNTER — Telehealth: Payer: Self-pay

## 2020-10-20 NOTE — Telephone Encounter (Signed)
   Telephone encounter was:  Successful.  10/20/2020 Name: Mark Goodman MRN: [id number] DOB: 04-29-1980  Mark Goodman is a 40 y.o. year old male who is a primary care patient of Anabel Halon, MD . The community resource team was consulted for assistance with Food Insecurity  Care guide performed the following interventions: poke with patient he stated that he is not interested in a list of food pantries.  He knows where the food pantries are in his area.  Follow Up Plan:  No further follow up planned at this time. The patient has been provided with needed resources.  Dominyk Law, AAS Paralegal, [facility] Care Guide  Embedded Care Coordination [place]  [facility]  [address] [place], [place] [address] ??[email or web address]  ?? [phone]   [email or web address]

## 2020-10-31 ENCOUNTER — Other Ambulatory Visit: Payer: Self-pay | Admitting: Internal Medicine

## 2020-10-31 DIAGNOSIS — E559 Vitamin D deficiency, unspecified: Secondary | ICD-10-CM

## 2020-11-10 DIAGNOSIS — F411 Generalized anxiety disorder: Secondary | ICD-10-CM | POA: Diagnosis not present

## 2020-11-10 DIAGNOSIS — F902 Attention-deficit hyperactivity disorder, combined type: Secondary | ICD-10-CM | POA: Diagnosis not present

## 2020-11-10 DIAGNOSIS — R69 Illness, unspecified: Secondary | ICD-10-CM | POA: Diagnosis not present

## 2020-11-10 DIAGNOSIS — F3162 Bipolar disorder, current episode mixed, moderate: Secondary | ICD-10-CM | POA: Diagnosis not present

## 2020-12-07 ENCOUNTER — Other Ambulatory Visit: Payer: Self-pay | Admitting: Internal Medicine

## 2020-12-07 DIAGNOSIS — F419 Anxiety disorder, unspecified: Secondary | ICD-10-CM

## 2021-01-19 ENCOUNTER — Other Ambulatory Visit: Payer: Self-pay | Admitting: Internal Medicine

## 2021-01-19 DIAGNOSIS — E782 Mixed hyperlipidemia: Secondary | ICD-10-CM

## 2021-01-19 DIAGNOSIS — K219 Gastro-esophageal reflux disease without esophagitis: Secondary | ICD-10-CM

## 2021-02-02 DIAGNOSIS — F411 Generalized anxiety disorder: Secondary | ICD-10-CM | POA: Diagnosis not present

## 2021-02-02 DIAGNOSIS — F902 Attention-deficit hyperactivity disorder, combined type: Secondary | ICD-10-CM | POA: Diagnosis not present

## 2021-02-02 DIAGNOSIS — Z5181 Encounter for therapeutic drug level monitoring: Secondary | ICD-10-CM | POA: Diagnosis not present

## 2021-02-02 DIAGNOSIS — F3162 Bipolar disorder, current episode mixed, moderate: Secondary | ICD-10-CM | POA: Diagnosis not present

## 2021-02-02 DIAGNOSIS — Z79899 Other long term (current) drug therapy: Secondary | ICD-10-CM | POA: Diagnosis not present

## 2021-02-02 DIAGNOSIS — F1320 Sedative, hypnotic or anxiolytic dependence, uncomplicated: Secondary | ICD-10-CM | POA: Diagnosis not present

## 2021-02-02 DIAGNOSIS — R69 Illness, unspecified: Secondary | ICD-10-CM | POA: Diagnosis not present

## 2021-02-05 ENCOUNTER — Ambulatory Visit (INDEPENDENT_AMBULATORY_CARE_PROVIDER_SITE_OTHER): Payer: Medicare HMO | Admitting: Internal Medicine

## 2021-02-05 ENCOUNTER — Other Ambulatory Visit: Payer: Self-pay

## 2021-02-05 ENCOUNTER — Encounter: Payer: Self-pay | Admitting: Internal Medicine

## 2021-02-05 DIAGNOSIS — G4733 Obstructive sleep apnea (adult) (pediatric): Secondary | ICD-10-CM | POA: Diagnosis not present

## 2021-02-05 NOTE — Progress Notes (Signed)
Virtual Visit via Telephone Note   This visit type was conducted due to national recommendations for restrictions regarding the COVID-19 Pandemic (e.g. social distancing) in an effort to limit this patient's exposure and mitigate transmission in our community.  Due to his co-morbid illnesses, this patient is at least at moderate risk for complications without adequate follow up.  This format is felt to be most appropriate for this patient at this time.  The patient did not have access to video technology/had technical difficulties with video requiring transitioning to audio format only (telephone).  All issues noted in this document were discussed and addressed.  No physical exam could be performed with this format.   Evaluation Performed:  Follow-up visit  Date:  02/05/2021   ID:  Mark Goodman, DOB 03-Sep-1980, MRN [id number]  Patient Location: Home Provider Location: Office/Clinic  Participants: Patient Location of Patient: Home Location of Provider: Telehealth Consent was obtain for visit to be over via telehealth. I verified that I am speaking with the correct person using two identifiers.  PCP:  Lindell Spar, MD   Chief Complaint: Insomnia  History of Present Illness:    Mark Goodman is a 41 y.o. male who has a televisit for complaint of insomnia, daytime fatigue and somnolence, which are chronic and persistent.  His wife has noticed him snoring at nighttime.  He also wakes up catching breath at times.  He has not had sleep test done yet.  The patient does not have symptoms concerning for COVID-19 infection (fever, chills, cough, or new shortness of breath).   Past Medical, Surgical, Social History, Allergies, and Medications have been Reviewed.  Past Medical History:  Diagnosis Date   ADHD (attention deficit hyperactivity disorder)    Bipolar 1 disorder (HCC)    Cleft uvula    Hypertension    PTSD (post-traumatic stress disorder)    Past Surgical  History:  Procedure Laterality Date   EXTERNAL EAR SURGERY       Current Meds  Medication Sig   ALPRAZolam (XANAX) 1 MG tablet Take 0.5-1 mg by mouth daily as needed.   ARIPiprazole (ABILIFY) 10 MG tablet Take 10 mg by mouth every morning.   aspirin EC 81 MG tablet Take 81 mg by mouth daily.   atorvastatin (LIPITOR) 20 MG tablet TAKE 1 TABLET BY MOUTH ONCE A DAY.   busPIRone (BUSPAR) 10 MG tablet Take 10 mg by mouth 3 (three) times daily.   fluticasone (FLONASE) 50 MCG/ACT nasal spray SMARTSIG:1-2 Spray(s) Both Nares Daily PRN   hydrOXYzine (ATARAX/VISTARIL) 50 MG tablet TAKE 1 TABLET BY MOUTH ONCE AT BEDTIME AS NEEDED FOR ANXIETY.   olmesartan-hydrochlorothiazide (BENICAR HCT) 20-12.5 MG tablet Take 1 tablet by mouth daily.   omeprazole (PRILOSEC) 40 MG capsule TAKE 1 CAPSULE BY MOUTH ONCE A DAY.   propranolol (INDERAL) 20 MG tablet Take 1 tablet (20 mg total) by mouth 2 (two) times daily.   Vitamin D, Ergocalciferol, (DRISDOL) 1.25 MG (50000 UNIT) CAPS capsule TAKE 1 CAPSULE BY MOUTH ONCE A WEEK.     Allergies:   Tomato and Penicillins   ROS:   Please see the history of present illness.     All other systems reviewed and are negative.   Labs/Other Tests and Data Reviewed:    Recent Labs: 04/24/2020: TSH 1.700 05/21/2020: ALT 31; BUN 19; Creatinine, Ser 1.27; Hemoglobin 14.3; Platelets 344; Potassium 3.8; Sodium 136   Recent Lipid Panel Lab Results  Component Value  Date/Time   CHOL 202 (H) 04/24/2020 03:43 PM   TRIG 238 (H) 04/24/2020 03:43 PM   HDL 31 (L) 04/24/2020 03:43 PM   CHOLHDL 6.5 (H) 04/24/2020 03:43 PM   LDLCALC 128 (H) 04/24/2020 03:43 PM    Wt Readings from Last 3 Encounters:  10/01/20 (!) 312 lb (141.5 kg)  10/01/20 (!) 312 lb (141.5 kg)  07/13/20 (!) 315 lb 0.6 oz (142.9 kg)     ASSESSMENT & PLAN:    Obstructive sleep apnea syndrome STOP-BANG: 6 Very high risk for OSA Will try to arrange for home sleep test Needs to work on losing weight Side  sleeping positions for now   Time:   Today, I have spent 11 minutes reviewing the chart, including problem list, medications, and with the patient with telehealth technology discussing the above problems.   Medication Adjustments/Labs and Tests Ordered: Current medicines are reviewed at length with the patient today.  Concerns regarding medicines are outlined above.   Tests Ordered: Orders Placed This Encounter  Procedures   Home sleep test    Medication Changes: No orders of the defined types were placed in this encounter.    Note: This dictation was prepared with Dragon dictation along with smaller phrase technology. Similar sounding words can be transcribed inadequately or may not be corrected upon review. Any transcriptional errors that result from this process are unintentional.      Disposition:  Follow up  Signed, Lindell Spar, MD  02/05/2021 3:39 PM     [place]

## 2021-02-05 NOTE — Assessment & Plan Note (Signed)
STOP-BANG: 6 Very high risk for OSA Will try to arrange for home sleep test Needs to work on losing weight Side sleeping positions for now

## 2021-02-05 NOTE — Patient Instructions (Signed)
We are trying arrange home sleep study for you.

## 2021-04-01 ENCOUNTER — Encounter: Payer: Medicare HMO | Admitting: Internal Medicine

## 2021-04-09 ENCOUNTER — Other Ambulatory Visit: Payer: Self-pay

## 2021-04-09 ENCOUNTER — Ambulatory Visit (INDEPENDENT_AMBULATORY_CARE_PROVIDER_SITE_OTHER): Payer: Medicare HMO | Admitting: Internal Medicine

## 2021-04-09 ENCOUNTER — Encounter: Payer: Self-pay | Admitting: Internal Medicine

## 2021-04-09 VITALS — BP 128/76 | HR 76 | Resp 18 | Ht 72.0 in | Wt 324.8 lb

## 2021-04-09 DIAGNOSIS — K219 Gastro-esophageal reflux disease without esophagitis: Secondary | ICD-10-CM | POA: Diagnosis not present

## 2021-04-09 DIAGNOSIS — Z0001 Encounter for general adult medical examination with abnormal findings: Secondary | ICD-10-CM

## 2021-04-09 DIAGNOSIS — E559 Vitamin D deficiency, unspecified: Secondary | ICD-10-CM | POA: Diagnosis not present

## 2021-04-09 DIAGNOSIS — I10 Essential (primary) hypertension: Secondary | ICD-10-CM | POA: Diagnosis not present

## 2021-04-09 DIAGNOSIS — Z720 Tobacco use: Secondary | ICD-10-CM | POA: Diagnosis not present

## 2021-04-09 DIAGNOSIS — E782 Mixed hyperlipidemia: Secondary | ICD-10-CM

## 2021-04-09 DIAGNOSIS — G43809 Other migraine, not intractable, without status migrainosus: Secondary | ICD-10-CM | POA: Diagnosis not present

## 2021-04-09 DIAGNOSIS — G4733 Obstructive sleep apnea (adult) (pediatric): Secondary | ICD-10-CM | POA: Diagnosis not present

## 2021-04-09 MED ORDER — OLMESARTAN MEDOXOMIL-HCTZ 20-12.5 MG PO TABS: 1.0000 | ORAL_TABLET | Freq: Every day | ORAL | 1 refills | Status: DC

## 2021-04-09 MED ORDER — PROPRANOLOL HCL 20 MG PO TABS: 20.0000 mg | ORAL_TABLET | Freq: Two times a day (BID) | ORAL | 5 refills | Status: DC

## 2021-04-09 MED ORDER — OMEPRAZOLE 40 MG PO CPDR: 40.0000 mg | DELAYED_RELEASE_CAPSULE | Freq: Every day | ORAL | 3 refills | Status: DC

## 2021-04-09 NOTE — Assessment & Plan Note (Signed)
Physical exam as documented. ?Fasting blood tests ordered today. ?Advised to get Tdap vaccine at local pharmacy. ?

## 2021-04-09 NOTE — Progress Notes (Signed)
? ?Established Patient Office Visit ? ?Subjective:  ?Patient ID: Mark Goodman, male    DOB: [birth year]  Age: 41 y.o. MRN: [id number] ? ?CC:  ?Chief Complaint  ?Patient presents with  ? Annual Exam  ?  Annual exam   ? ? ?HPI ?Mark Goodman is a 41 y.o. male with past medical history of HTN, HLD, Bipolar disorder, anxiety, GERD and tobacco abuse who presents for annual physical. ? ?HTN: BP is well-controlled now. Takes medications regularly. Patient denies headache, dizziness, chest pain, dyspnea or palpitations. ?  ?[facility] for [facility] disorder and anxiety.  He is on Abilify, as needed Xanax and BuSpar currently.  He continues to take Atarax as needed for anxiety/insomnia.  He feels better now.  Denies any anhedonia, SI or HI. ? ?He has not been able to get sleep study yet. ? ?He has cut down smoking to 1 pack/day from 1.5 pack/day. ? ? ?Past Medical History:  ?Diagnosis Date  ? ADHD (attention deficit hyperactivity disorder)   ? Bipolar 1 disorder ([facility])   ? Cleft uvula   ? Hypertension   ? PTSD (post-traumatic stress disorder)   ? ? ?Past Surgical History:  ?Procedure Laterality Date  ? EXTERNAL EAR SURGERY    ? ? ?History reviewed. No pertinent family history. ? ?Social History  ? ?Socioeconomic History  ? Marital status: Married  ?  Spouse name: Not on file  ? Number of children: 2  ? Years of education: Not on file  ? Highest education level: Not on file  ?Occupational History  ? Not on file  ?Tobacco Use  ? Smoking status: Every Day  ?  Packs/day: 1.00  ?  Years: 20.00  ?  Pack years: 20.00  ?  Types: Cigarettes  ? Smokeless tobacco: Never  ?Vaping Use  ? Vaping Use: Never used  ?Substance and Sexual Activity  ? Alcohol use: No  ? Drug use: No  ? Sexual activity: Not on file  ?Other Topics Concern  ? Not on file  ?Social History Narrative  ? Currently on disability.  ? 2 children  ? ?Social Determinants of Health  ? ?Financial Resource Strain: Medium Risk  ?  Difficulty of Paying Living Expenses: Somewhat hard  ?Food Insecurity: Food Insecurity Present  ? Worried About Charity fundraiser in the Last Year: Often true  ? Ran Out of Food in the Last Year: Often true  ?Transportation Needs: No Transportation Needs  ? Lack of Transportation (Medical): No  ? Lack of Transportation (Non-Medical): No  ?Physical Activity: Sufficiently Active  ? Days of Exercise per Week: 5 days  ? Minutes of Exercise per Session: 30 min  ?Stress: Stress Concern Present  ? Feeling of Stress : Rather much  ?Social Connections: Socially Integrated  ? Frequency of Communication with Friends and Family: More than three times a week  ? Frequency of Social Gatherings with Friends and Family: More than three times a week  ? Attends Religious Services: More than 4 times per year  ? Active Member of Clubs or Organizations: Yes  ? Attends Archivist Meetings: Never  ? Marital Status: Married  ?Intimate Partner Violence: Not At Risk  ? Fear of Current or Ex-Partner: No  ? Emotionally Abused: No  ? Physically Abused: No  ? Sexually Abused: No  ? ? ?Outpatient Medications Prior to Visit  ?Medication Sig Dispense Refill  ? ALPRAZolam (XANAX) 1 MG tablet Take 0.5-1  mg by mouth daily as needed.    ? ARIPiprazole (ABILIFY) 10 MG tablet Take 10 mg by mouth every morning.    ? aspirin EC 81 MG tablet Take 81 mg by mouth daily.    ? atorvastatin (LIPITOR) 20 MG tablet TAKE 1 TABLET BY MOUTH ONCE A DAY. 90 tablet 1  ? busPIRone (BUSPAR) 10 MG tablet Take 10 mg by mouth 3 (three) times daily.    ? fluticasone (FLONASE) 50 MCG/ACT nasal spray SMARTSIG:1-2 Spray(s) Both Nares Daily PRN    ? hydrOXYzine (ATARAX/VISTARIL) 50 MG tablet TAKE 1 TABLET BY MOUTH ONCE AT BEDTIME AS NEEDED FOR ANXIETY. 30 tablet 0  ? Vitamin D, Ergocalciferol, (DRISDOL) 1.25 MG (50000 UNIT) CAPS capsule TAKE 1 CAPSULE BY MOUTH ONCE A WEEK. 5 capsule 0  ? olmesartan-hydrochlorothiazide (BENICAR HCT) 20-12.5 MG tablet Take 1 tablet by  mouth daily. 30 tablet 5  ? omeprazole (PRILOSEC) 40 MG capsule TAKE 1 CAPSULE BY MOUTH ONCE A DAY. 90 capsule 0  ? propranolol (INDERAL) 20 MG tablet Take 1 tablet (20 mg total) by mouth 2 (two) times daily. 60 tablet 5  ? ?No facility-administered medications prior to visit.  ? ? ? ? ?ROS ?Review of Systems  ?Constitutional:  Negative for chills and fever.  ?HENT:  Negative for congestion and sore throat.   ?Eyes:  Negative for pain and discharge.  ?Respiratory:  Negative for cough and shortness of breath.   ?Cardiovascular:  Negative for chest pain and palpitations.  ?Gastrointestinal:  Negative for constipation, diarrhea, nausea and vomiting.  ?Endocrine: Negative for polydipsia and polyuria.  ?Genitourinary:  Negative for dysuria and hematuria.  ?Musculoskeletal:  Negative for neck pain and neck stiffness.  ?Skin:  Negative for rash.  ?Neurological:  Negative for dizziness, weakness, numbness and headaches.  ?Psychiatric/Behavioral:  Negative for agitation, behavioral problems and sleep disturbance. The patient is not nervous/anxious.   ? ?  ?Objective:  ?  ?Physical Exam ?Vitals reviewed.  ?Constitutional:   ?   General: He is not in acute distress. ?   Appearance: He is obese. He is not diaphoretic.  ?HENT:  ?   Head: Normocephalic and atraumatic.  ?   Nose: Nose normal.  ?   Mouth/Throat:  ?   Mouth: Mucous membranes are moist.  ?Eyes:  ?   General: No scleral icterus. ?   Extraocular Movements: Extraocular movements intact.  ?Cardiovascular:  ?   Rate and Rhythm: Normal rate and regular rhythm.  ?   Pulses: Normal pulses.  ?   Heart sounds: Normal heart sounds. No murmur heard. ?Pulmonary:  ?   Breath sounds: Normal breath sounds. No wheezing or rales.  ?Abdominal:  ?   Palpations: Abdomen is soft.  ?   Tenderness: There is no abdominal tenderness.  ?Musculoskeletal:  ?   Cervical back: Neck supple. No tenderness.  ?   Right lower leg: No edema.  ?   Left lower leg: No edema.  ?Skin: ?   General: Skin is  warm.  ?   Findings: No rash.  ?Neurological:  ?   General: No focal deficit present.  ?   Mental Status: He is alert and oriented to person, place, and time.  ?   Cranial Nerves: No cranial nerve deficit.  ?   Sensory: No sensory deficit.  ?   Motor: No weakness.  ?Psychiatric:     ?   Mood and Affect: Mood normal.     ?   Behavior: Behavior  normal.     ?   Cognition and Memory: Cognition normal.  ? ? ?BP 128/76 (BP Location: Left Arm, Patient Position: Sitting, Cuff Size: Normal)   Pulse 76   Resp 18   Ht 6' (1.829 m)   Wt (!) 324 lb 12.8 oz (147.3 kg)   SpO2 96%   BMI 44.05 kg/m?  ?Wt Readings from Last 3 Encounters:  ?04/09/21 (!) 324 lb 12.8 oz (147.3 kg)  ?10/01/20 (!) 312 lb (141.5 kg)  ?10/01/20 (!) 312 lb (141.5 kg)  ? ? ?Lab Results  ?Component Value Date  ? TSH 1.700 04/24/2020  ? ?Lab Results  ?Component Value Date  ? WBC 14.7 (H) 05/21/2020  ? HGB 14.3 05/21/2020  ? HCT 43.0 05/21/2020  ? MCV 92.9 05/21/2020  ? PLT 344 05/21/2020  ? ?Lab Results  ?Component Value Date  ? NA 136 05/21/2020  ? K 3.8 05/21/2020  ? CO2 26 05/21/2020  ? GLUCOSE 96 05/21/2020  ? BUN 19 05/21/2020  ? CREATININE 1.27 (H) 05/21/2020  ? BILITOT 0.9 05/21/2020  ? ALKPHOS 110 05/21/2020  ? AST 23 05/21/2020  ? ALT 31 05/21/2020  ? PROT 7.2 05/21/2020  ? ALBUMIN 3.8 05/21/2020  ? CALCIUM 8.9 05/21/2020  ? ANIONGAP 8 05/21/2020  ? EGFR 102 04/24/2020  ? ?Lab Results  ?Component Value Date  ? CHOL 202 (H) 04/24/2020  ? ?Lab Results  ?Component Value Date  ? HDL 31 (L) 04/24/2020  ? ?Lab Results  ?Component Value Date  ? LDLCALC 128 (H) 04/24/2020  ? ?Lab Results  ?Component Value Date  ? TRIG 238 (H) 04/24/2020  ? ?Lab Results  ?Component Value Date  ? CHOLHDL 6.5 (H) 04/24/2020  ? ?Lab Results  ?Component Value Date  ? HGBA1C 6.2 (H) 04/24/2020  ? ? ?  ?Assessment & Plan:  ? ?Problem List Items Addressed This Visit   ? ?  ? Cardiovascular and Mediastinum  ? Migraine  ?  On Propranolol for ppx ?Tylenol PRN ?  ?  ? Relevant  Medications  ? propranolol (INDERAL) 20 MG tablet  ? olmesartan-hydrochlorothiazide (BENICAR HCT) 20-12.5 MG tablet  ? Primary hypertension  ?  BP Readings from Last 1 Encounters:  ?04/09/21 128/76  ?Well-controlled with

## 2021-04-09 NOTE — Assessment & Plan Note (Signed)
On Propranolol for ppx Tylenol PRN 

## 2021-04-09 NOTE — Assessment & Plan Note (Signed)
BP Readings from Last 1 Encounters:  ?04/09/21 128/76  ? ?Well-controlled with Olmesartan-HCTZ 20-12.5 mg QD ?Counseled for compliance with the medications ?Advised DASH diet and moderate exercise/walking, at least 150 mins/week ? ?

## 2021-04-09 NOTE — Assessment & Plan Note (Signed)
Smokes 1 pack/day  Asked about quitting: confirms that he currently smokes cigarettes Advise to quit smoking: Educated about QUITTING to reduce the risk of cancer, cardio and cerebrovascular disease. Assess willingness: Unwilling to quit at this time, but is working on cutting back. Assist with counseling and pharmacotherapy: Counseled for 5 minutes and literature provided. Arrange for follow up: follow up in 3 months and continue to offer help. 

## 2021-04-09 NOTE — Assessment & Plan Note (Signed)
STOP-BANG: 6 ?Very high risk for OSA ?Will try to arrange for home sleep test ?Needs to work on losing weight ?Side sleeping positions for now ?

## 2021-04-09 NOTE — Patient Instructions (Signed)
Please continue taking medications as prescribed. ? ?Please continue to follow low salt diet and ambulate as tolerated. ? ?Please get fasting blood tests done within a week. ? ?Please consider getting TDaP vaccine at your local pharmacy. ?

## 2021-04-09 NOTE — Assessment & Plan Note (Signed)
Well controlled with omeprazole 40 mg daily ?

## 2021-04-10 DIAGNOSIS — F3162 Bipolar disorder, current episode mixed, moderate: Secondary | ICD-10-CM | POA: Diagnosis not present

## 2021-04-10 DIAGNOSIS — F902 Attention-deficit hyperactivity disorder, combined type: Secondary | ICD-10-CM | POA: Diagnosis not present

## 2021-04-10 DIAGNOSIS — R69 Illness, unspecified: Secondary | ICD-10-CM | POA: Diagnosis not present

## 2021-04-10 DIAGNOSIS — F411 Generalized anxiety disorder: Secondary | ICD-10-CM | POA: Diagnosis not present

## 2021-04-22 ENCOUNTER — Other Ambulatory Visit: Payer: Self-pay | Admitting: *Deleted

## 2021-04-22 ENCOUNTER — Telehealth: Payer: Self-pay

## 2021-04-22 DIAGNOSIS — E559 Vitamin D deficiency, unspecified: Secondary | ICD-10-CM

## 2021-04-22 MED ORDER — VITAMIN D (ERGOCALCIFEROL) 1.25 MG (50000 UNIT) PO CAPS: 50000.0000 [IU] | ORAL_CAPSULE | ORAL | 0 refills | Status: DC

## 2021-04-22 NOTE — Telephone Encounter (Signed)
Medication sent to pharmacy  

## 2021-04-22 NOTE — Telephone Encounter (Signed)
Patient it requesting refill for Vit D.  Please call into [facility]. ?

## 2021-04-27 ENCOUNTER — Encounter: Payer: Self-pay | Admitting: Internal Medicine

## 2021-04-27 ENCOUNTER — Ambulatory Visit (INDEPENDENT_AMBULATORY_CARE_PROVIDER_SITE_OTHER): Payer: Medicare HMO | Admitting: Internal Medicine

## 2021-04-27 DIAGNOSIS — K529 Noninfective gastroenteritis and colitis, unspecified: Secondary | ICD-10-CM | POA: Diagnosis not present

## 2021-04-27 MED ORDER — AZITHROMYCIN 500 MG PO TABS: 500.0000 mg | ORAL_TABLET | Freq: Every day | ORAL | 0 refills | Status: DC

## 2021-04-27 NOTE — Progress Notes (Signed)
?  ? ?Virtual Visit via Telephone Note  ? ?This visit type was conducted due to national recommendations for restrictions regarding the COVID-19 Pandemic (e.g. social distancing) in an effort to limit this patient's exposure and mitigate transmission in our community.  Due to his co-morbid illnesses, this patient is at least at moderate risk for complications without adequate follow up.  This format is felt to be most appropriate for this patient at this time.  The patient did not have access to video technology/had technical difficulties with video requiring transitioning to audio format only (telephone).  All issues noted in this document were discussed and addressed.  No physical exam could be performed with this format. ? ?Evaluation Performed:  Follow-up visit ? ?Date:  04/27/2021  ? ?ID:  Mark Goodman, DOB 08-02-1980, MRN [id number] ? ?Patient Location: Home ?Provider Location: Office/Clinic ? ?Participants: Patient ?Location of Patient: Home ?Location of Provider: Telehealth ?Consent was obtain for visit to be over via telehealth. ?I verified that I am speaking with the correct person using two identifiers. ? ?PCP:  Anabel Halon, MD  ? ?Chief Complaint: Watery diarrhea ? ?History of Present Illness:   ? ?Mark Goodman is a 41 y.o. male who has a televisit for complaint of watery diarrhea for the last 5 days.  He also reported fever initially.  He denies any nausea or vomiting currently.  He states that he has had poor p.o. intake since he has had diarrhea, but agrees to stay hydrated now and eat as tolerated for now.  He denies any melena or hematochezia.  Denies any dizziness or dyspnea currently. ? ?The patient does not have symptoms concerning for COVID-19 infection (fever, chills, cough, or new shortness of breath).  ? ?Past Medical, Surgical, Social History, Allergies, and Medications have been Reviewed. ? ?Past Medical History:  ?Diagnosis Date  ? ADHD (attention deficit hyperactivity  disorder)   ? Bipolar 1 disorder (HCC)   ? Cleft uvula   ? Hypertension   ? PTSD (post-traumatic stress disorder)   ? ?Past Surgical History:  ?Procedure Laterality Date  ? EXTERNAL EAR SURGERY    ?  ? ?Current Meds  ?Medication Sig  ? ALPRAZolam (XANAX) 1 MG tablet Take 0.5-1 mg by mouth daily as needed.  ? ARIPiprazole (ABILIFY) 10 MG tablet Take 10 mg by mouth every morning.  ? aspirin EC 81 MG tablet Take 81 mg by mouth daily.  ? atorvastatin (LIPITOR) 20 MG tablet TAKE 1 TABLET BY MOUTH ONCE A DAY.  ? busPIRone (BUSPAR) 10 MG tablet Take 10 mg by mouth 3 (three) times daily.  ? fluticasone (FLONASE) 50 MCG/ACT nasal spray SMARTSIG:1-2 Spray(s) Both Nares Daily PRN  ? hydrOXYzine (ATARAX/VISTARIL) 50 MG tablet TAKE 1 TABLET BY MOUTH ONCE AT BEDTIME AS NEEDED FOR ANXIETY.  ? olmesartan-hydrochlorothiazide (BENICAR HCT) 20-12.5 MG tablet Take 1 tablet by mouth daily.  ? omeprazole (PRILOSEC) 40 MG capsule Take 1 capsule (40 mg total) by mouth daily.  ? propranolol (INDERAL) 20 MG tablet Take 1 tablet (20 mg total) by mouth 2 (two) times daily.  ? Vitamin D, Ergocalciferol, (DRISDOL) 1.25 MG (50000 UNIT) CAPS capsule Take 1 capsule (50,000 Units total) by mouth once a week.  ?  ? ?Allergies:   Tomato and Penicillins  ? ?ROS:   ?Please see the history of present illness.    ? ?All other systems reviewed and are negative. ? ? ?Labs/Other Tests and Data Reviewed:   ? ?Recent Labs: ?  05/21/2020: ALT 31; BUN 19; Creatinine, Ser 1.27; Hemoglobin 14.3; Platelets 344; Potassium 3.8; Sodium 136  ? ?Recent Lipid Panel ?Lab Results  ?Component Value Date/Time  ? CHOL 202 (H) 04/24/2020 03:43 PM  ? TRIG 238 (H) 04/24/2020 03:43 PM  ? HDL 31 (L) 04/24/2020 03:43 PM  ? CHOLHDL 6.5 (H) 04/24/2020 03:43 PM  ? LDLCALC 128 (H) 04/24/2020 03:43 PM  ? ? ?Wt Readings from Last 3 Encounters:  ?04/09/21 (!) 324 lb 12.8 oz (147.3 kg)  ?10/01/20 (!) 312 lb (141.5 kg)  ?10/01/20 (!) 312 lb (141.5 kg)  ?  ? ?ASSESSMENT & PLAN:   ? ?Acute  gastroenteritis ?Started empiric azithromycin 500 mg daily X 3 days ?Advised to maintain adequate hydration and eat as tolerated ?If unable to tolerate p.o., needs to go to ER for IV hydration ? ?Time:   ?Today, I have spent 9 minutes reviewing the chart, including problem list, medications, and with the patient with telehealth technology discussing the above problems. ? ? ?Medication Adjustments/Labs and Tests Ordered: ?Current medicines are reviewed at length with the patient today.  Concerns regarding medicines are outlined above.  ? ?Tests Ordered: ?No orders of the defined types were placed in this encounter. ? ? ?Medication Changes: ?No orders of the defined types were placed in this encounter. ? ? ? ?Note: This dictation was prepared with Dragon dictation along with smaller phrase technology. Similar sounding words can be transcribed inadequately or may not be corrected upon review. Any transcriptional errors that result from this process are unintentional.  ?  ? ? ?Disposition:  Follow up  ?Signed, ?Anabel Halon, MD  ?04/27/2021 2:56 PM    ? ?[facility] ?[facility] ?

## 2021-04-30 ENCOUNTER — Telehealth: Payer: Medicare HMO | Admitting: Family Medicine

## 2021-06-02 DIAGNOSIS — F3162 Bipolar disorder, current episode mixed, moderate: Secondary | ICD-10-CM | POA: Diagnosis not present

## 2021-06-02 DIAGNOSIS — R69 Illness, unspecified: Secondary | ICD-10-CM | POA: Diagnosis not present

## 2021-06-02 DIAGNOSIS — F902 Attention-deficit hyperactivity disorder, combined type: Secondary | ICD-10-CM | POA: Diagnosis not present

## 2021-06-02 DIAGNOSIS — F411 Generalized anxiety disorder: Secondary | ICD-10-CM | POA: Diagnosis not present

## 2021-07-08 DIAGNOSIS — R69 Illness, unspecified: Secondary | ICD-10-CM | POA: Diagnosis not present

## 2021-07-08 DIAGNOSIS — F902 Attention-deficit hyperactivity disorder, combined type: Secondary | ICD-10-CM | POA: Diagnosis not present

## 2021-07-08 DIAGNOSIS — F411 Generalized anxiety disorder: Secondary | ICD-10-CM | POA: Diagnosis not present

## 2021-07-08 DIAGNOSIS — F3162 Bipolar disorder, current episode mixed, moderate: Secondary | ICD-10-CM | POA: Diagnosis not present

## 2021-08-06 DIAGNOSIS — F411 Generalized anxiety disorder: Secondary | ICD-10-CM | POA: Diagnosis not present

## 2021-08-06 DIAGNOSIS — Z79899 Other long term (current) drug therapy: Secondary | ICD-10-CM | POA: Diagnosis not present

## 2021-08-06 DIAGNOSIS — F902 Attention-deficit hyperactivity disorder, combined type: Secondary | ICD-10-CM | POA: Diagnosis not present

## 2021-08-06 DIAGNOSIS — R69 Illness, unspecified: Secondary | ICD-10-CM | POA: Diagnosis not present

## 2021-08-06 DIAGNOSIS — Z5181 Encounter for therapeutic drug level monitoring: Secondary | ICD-10-CM | POA: Diagnosis not present

## 2021-08-06 DIAGNOSIS — F3162 Bipolar disorder, current episode mixed, moderate: Secondary | ICD-10-CM | POA: Diagnosis not present

## 2021-08-06 DIAGNOSIS — F1320 Sedative, hypnotic or anxiolytic dependence, uncomplicated: Secondary | ICD-10-CM | POA: Diagnosis not present

## 2021-09-03 DIAGNOSIS — R69 Illness, unspecified: Secondary | ICD-10-CM | POA: Diagnosis not present

## 2021-09-03 DIAGNOSIS — F411 Generalized anxiety disorder: Secondary | ICD-10-CM | POA: Diagnosis not present

## 2021-09-03 DIAGNOSIS — F3162 Bipolar disorder, current episode mixed, moderate: Secondary | ICD-10-CM | POA: Diagnosis not present

## 2021-09-03 DIAGNOSIS — F902 Attention-deficit hyperactivity disorder, combined type: Secondary | ICD-10-CM | POA: Diagnosis not present

## 2021-10-05 ENCOUNTER — Ambulatory Visit (INDEPENDENT_AMBULATORY_CARE_PROVIDER_SITE_OTHER): Payer: Medicare HMO

## 2021-10-05 ENCOUNTER — Other Ambulatory Visit: Payer: Self-pay | Admitting: Internal Medicine

## 2021-10-05 ENCOUNTER — Telehealth: Payer: Self-pay

## 2021-10-05 ENCOUNTER — Other Ambulatory Visit: Payer: Self-pay

## 2021-10-05 DIAGNOSIS — Z5941 Food insecurity: Secondary | ICD-10-CM

## 2021-10-05 DIAGNOSIS — E782 Mixed hyperlipidemia: Secondary | ICD-10-CM

## 2021-10-05 DIAGNOSIS — E559 Vitamin D deficiency, unspecified: Secondary | ICD-10-CM

## 2021-10-05 DIAGNOSIS — F419 Anxiety disorder, unspecified: Secondary | ICD-10-CM

## 2021-10-05 DIAGNOSIS — I10 Essential (primary) hypertension: Secondary | ICD-10-CM

## 2021-10-05 DIAGNOSIS — Z0000 Encounter for general adult medical examination without abnormal findings: Secondary | ICD-10-CM | POA: Diagnosis not present

## 2021-10-05 DIAGNOSIS — K219 Gastro-esophageal reflux disease without esophagitis: Secondary | ICD-10-CM

## 2021-10-05 DIAGNOSIS — K529 Noninfective gastroenteritis and colitis, unspecified: Secondary | ICD-10-CM

## 2021-10-05 DIAGNOSIS — G43809 Other migraine, not intractable, without status migrainosus: Secondary | ICD-10-CM

## 2021-10-05 MED ORDER — BUSPIRONE HCL 10 MG PO TABS
10.0000 mg | ORAL_TABLET | Freq: Three times a day (TID) | ORAL | 0 refills | Status: DC
Start: 2021-10-05 — End: 2022-05-13

## 2021-10-05 MED ORDER — PROPRANOLOL HCL 20 MG PO TABS: 20.0000 mg | ORAL_TABLET | Freq: Two times a day (BID) | ORAL | 5 refills | Status: DC

## 2021-10-05 MED ORDER — ARIPIPRAZOLE 10 MG PO TABS: 10.0000 mg | ORAL_TABLET | Freq: Every morning | ORAL | 0 refills | Status: DC

## 2021-10-05 MED ORDER — VITAMIN D (ERGOCALCIFEROL) 1.25 MG (50000 UNIT) PO CAPS: 50000.0000 [IU] | ORAL_CAPSULE | ORAL | 0 refills | Status: DC

## 2021-10-05 MED ORDER — OMEPRAZOLE 40 MG PO CPDR: 40.0000 mg | DELAYED_RELEASE_CAPSULE | Freq: Every day | ORAL | 3 refills | Status: DC

## 2021-10-05 MED ORDER — ASPIRIN 81 MG PO TBEC: 81.0000 mg | DELAYED_RELEASE_TABLET | Freq: Every day | ORAL | 11 refills | Status: AC

## 2021-10-05 MED ORDER — FLUTICASONE PROPIONATE 50 MCG/ACT NA SUSP: NASAL | 1 refills | Status: DC

## 2021-10-05 MED ORDER — AZITHROMYCIN 500 MG PO TABS: 500.0000 mg | ORAL_TABLET | Freq: Every day | ORAL | 0 refills | Status: DC

## 2021-10-05 MED ORDER — ATORVASTATIN CALCIUM 20 MG PO TABS: 20.0000 mg | ORAL_TABLET | Freq: Every day | ORAL | 1 refills | Status: DC

## 2021-10-05 MED ORDER — OLMESARTAN MEDOXOMIL-HCTZ 20-12.5 MG PO TABS: 1.0000 | ORAL_TABLET | Freq: Every day | ORAL | 1 refills | Status: DC

## 2021-10-05 MED ORDER — HYDROXYZINE HCL 50 MG PO TABS: ORAL_TABLET | ORAL | 0 refills | Status: DC

## 2021-10-05 NOTE — Patient Instructions (Signed)
[name] , Thank you for taking time to come for your Medicare Wellness Visit. I appreciate your ongoing commitment to your health goals. Please review the following plan we discussed and let me know if I can assist you in the future.   Screening recommendations/referrals:  Recommended yearly ophthalmology/optometry visit for glaucoma screening and checkup Recommended yearly dental visit for hygiene and checkup  Vaccinations: Influenza vaccine: Due Tdap vaccine: Due   Advanced directives: declined  Conditions/risks identified: falls, hypertension  Next appointment: 1 year  Preventive Care 40-64 Years, Male Preventive care refers to lifestyle choices and visits with your health care provider that can promote health and wellness. What does preventive care include? A yearly physical exam. This is also called an annual well check. Dental exams once or twice a year. Routine eye exams. Ask your health care provider how often you should have your eyes checked. Personal lifestyle choices, including: Daily care of your teeth and gums. Regular physical activity. Eating a healthy diet. Avoiding tobacco and drug use. Limiting alcohol use. Practicing safe sex. Taking low-dose aspirin every day starting at age 84. What happens during an annual well check? The services and screenings done by your health care provider during your annual well check will depend on your age, overall health, lifestyle risk factors, and family history of disease. Counseling  Your health care provider may ask you questions about your: Alcohol use. Tobacco use. Drug use. Emotional well-being. Home and relationship well-being. Sexual activity. Eating habits. Work and work Astronomer. Screening  You may have the following tests or measurements: Height, weight, and BMI. Blood pressure. Lipid and cholesterol levels. These may be checked every 5 years, or more frequently if you are over 16 years  old. Skin check. Lung cancer screening. You may have this screening every year starting at age 4 if you have a 30-pack-year history of smoking and currently smoke or have quit within the past 15 years. Fecal occult blood test (FOBT) of the stool. You may have this test every year starting at age 66. Flexible sigmoidoscopy or colonoscopy. You may have a sigmoidoscopy every 5 years or a colonoscopy every 10 years starting at age 35. Prostate cancer screening. Recommendations will vary depending on your family history and other risks. Hepatitis C blood test. Hepatitis B blood test. Sexually transmitted disease (STD) testing. Diabetes screening. This is done by checking your blood sugar (glucose) after you have not eaten for a while (fasting). You may have this done every 1-3 years. Discuss your test results, treatment options, and if necessary, the need for more tests with your health care provider. Vaccines  Your health care provider may recommend certain vaccines, such as: Influenza vaccine. This is recommended every year. Tetanus, diphtheria, and acellular pertussis (Tdap, Td) vaccine. You may need a Td booster every 10 years. Zoster vaccine. You may need this after age 74. Pneumococcal 13-valent conjugate (PCV13) vaccine. You may need this if you have certain conditions and have not been vaccinated. Pneumococcal polysaccharide (PPSV23) vaccine. You may need one or two doses if you smoke cigarettes or if you have certain conditions. Talk to your health care provider about which screenings and vaccines you need and how often you need them. This information is not intended to replace advice given to you by your health care provider. Make sure you discuss any questions you have with your health care provider. Document Released: 01/31/2015 Document Revised: 09/24/2015 Document Reviewed: 11/05/2014 Elsevier Interactive Patient Education  2017 [employer].  Fall Prevention in the Home Falls can  cause injuries. They can happen to people of all ages. There are many things you can do to make your home safe and to help prevent falls. What can I do on the outside of my home? Regularly fix the edges of walkways and driveways and fix any cracks. Remove anything that might make you trip as you walk through a door, such as a raised step or threshold. Trim any bushes or trees on the path to your home. Use bright outdoor lighting. Clear any walking paths of anything that might make someone trip, such as rocks or tools. Regularly check to see if handrails are loose or broken. Make sure that both sides of any steps have handrails. Any raised decks and porches should have guardrails on the edges. Have any leaves, snow, or ice cleared regularly. Use sand or salt on walking paths during winter. Clean up any spills in your garage right away. This includes oil or grease spills. What can I do in the bathroom? Use night lights. Install grab bars by the toilet and in the tub and shower. Do not use towel bars as grab bars. Use non-skid mats or decals in the tub or shower. If you need to sit down in the shower, use a plastic, non-slip stool. Keep the floor dry. Clean up any water that spills on the floor as soon as it happens. Remove soap buildup in the tub or shower regularly. Attach bath mats securely with double-sided non-slip rug tape. Do not have throw rugs and other things on the floor that can make you trip. What can I do in the bedroom? Use night lights. Make sure that you have a light by your bed that is easy to reach. Do not use any sheets or blankets that are too big for your bed. They should not hang down onto the floor. Have a firm chair that has side arms. You can use this for support while you get dressed. Do not have throw rugs and other things on the floor that can make you trip. What can I do in the kitchen? Clean up any spills right away. Avoid walking on wet floors. Keep items  that you use a lot in easy-to-reach places. If you need to reach something above you, use a strong step stool that has a grab bar. Keep electrical cords out of the way. Do not use floor polish or wax that makes floors slippery. If you must use wax, use non-skid floor wax. Do not have throw rugs and other things on the floor that can make you trip. What can I do with my stairs? Do not leave any items on the stairs. Make sure that there are handrails on both sides of the stairs and use them. Fix handrails that are broken or loose. Make sure that handrails are as long as the stairways. Check any carpeting to make sure that it is firmly attached to the stairs. Fix any carpet that is loose or worn. Avoid having throw rugs at the top or bottom of the stairs. If you do have throw rugs, attach them to the floor with carpet tape. Make sure that you have a light switch at the top of the stairs and the bottom of the stairs. If you do not have them, ask someone to add them for you. What else can I do to help prevent falls? Wear shoes that: Do not have high heels. Have rubber bottoms. Are comfortable and fit  you well. Are closed at the toe. Do not wear sandals. If you use a stepladder: Make sure that it is fully opened. Do not climb a closed stepladder. Make sure that both sides of the stepladder are locked into place. Ask someone to hold it for you, if possible. Clearly mark and make sure that you can see: Any grab bars or handrails. First and last steps. Where the edge of each step is. Use tools that help you move around (mobility aids) if they are needed. These include: Canes. Walkers. Scooters. Crutches. Turn on the lights when you go into a dark area. Replace any light bulbs as soon as they burn out. Set up your furniture so you have a clear path. Avoid moving your furniture around. If any of your floors are uneven, fix them. If there are any pets around you, be aware of where they  are. Review your medicines with your doctor. Some medicines can make you feel dizzy. This can increase your chance of falling. Ask your doctor what other things that you can do to help prevent falls. This information is not intended to replace advice given to you by your health care provider. Make sure you discuss any questions you have with your health care provider. Document Released: 10/31/2008 Document Revised: 06/12/2015 Document Reviewed: 02/08/2014 Elsevier Interactive Patient Education  2017 [employer].

## 2021-10-05 NOTE — Progress Notes (Signed)
Subjective:   Mark Goodman is a 41 y.o. male who presents for Medicare Annual/Subsequent preventive examination. I connected with  Mark Goodman on 10/05/21 by a audio enabled telemedicine application and verified that I am speaking with the correct person using two identifiers.  Patient Location: Home  Provider Location: Office/Clinic  I discussed the limitations of evaluation and management by telemedicine. The patient expressed understanding and agreed to proceed.   Review of Systems     Mark Goodman , Thank you for taking time to come for your Medicare Wellness Visit. I appreciate your ongoing commitment to your health goals. Please review the following plan we discussed and let me know if I can assist you in the future.   These are the goals we discussed:  Goals      Quit Smoking        This is a list of the screening recommended for you and due dates:  Health Maintenance  Topic Date Due   COVID-19 Vaccine (1) Never done   Tetanus Vaccine  Never done   Flu Shot  Never done   Hepatitis C Screening: USPSTF Recommendation to screen - Ages 21-79 yo.  Completed   HIV Screening  Completed   HPV Vaccine  Aged Out          Objective:    There were no vitals filed for this visit. There is no height or weight on file to calculate BMI.     10/01/2020    3:16 PM 07/13/2020    6:29 AM 05/21/2020    5:43 PM 02/26/2018    6:49 PM 08/22/2017    9:39 AM 06/07/2016    9:06 AM 06/11/2015    1:46 PM  Advanced Directives  Does Patient Have a Medical Advance Directive? No No No No No No No  Would patient like information on creating a medical advance directive? No - Patient declined No - Patient declined   No - Patient declined      Current Medications (verified) Outpatient Encounter Medications as of 10/05/2021  Medication Sig   ALPRAZolam (XANAX) 1 MG tablet Take 0.5-1 mg by mouth daily as needed.   ARIPiprazole (ABILIFY) 10 MG tablet Take 10 mg by mouth every  morning.   aspirin EC 81 MG tablet Take 81 mg by mouth daily.   atorvastatin (LIPITOR) 20 MG tablet TAKE 1 TABLET BY MOUTH ONCE A DAY.   azithromycin (ZITHROMAX) 500 MG tablet Take 1 tablet (500 mg total) by mouth daily.   busPIRone (BUSPAR) 10 MG tablet Take 10 mg by mouth 3 (three) times daily.   fluticasone (FLONASE) 50 MCG/ACT nasal spray SMARTSIG:1-2 Spray(s) Both Nares Daily PRN   hydrOXYzine (ATARAX/VISTARIL) 50 MG tablet TAKE 1 TABLET BY MOUTH ONCE AT BEDTIME AS NEEDED FOR ANXIETY.   olmesartan-hydrochlorothiazide (BENICAR HCT) 20-12.5 MG tablet Take 1 tablet by mouth daily.   omeprazole (PRILOSEC) 40 MG capsule Take 1 capsule (40 mg total) by mouth daily.   propranolol (INDERAL) 20 MG tablet Take 1 tablet (20 mg total) by mouth 2 (two) times daily.   Vitamin D, Ergocalciferol, (DRISDOL) 1.25 MG (50000 UNIT) CAPS capsule Take 1 capsule (50,000 Units total) by mouth once a week.   No facility-administered encounter medications on file as of 10/05/2021.    Allergies (verified) Tomato and Penicillins   History: Past Medical History:  Diagnosis Date   ADHD (attention deficit hyperactivity disorder)    Bipolar 1 disorder ([place])  Cleft uvula    Hypertension    PTSD (post-traumatic stress disorder)    Past Surgical History:  Procedure Laterality Date   EXTERNAL EAR SURGERY     No family history on file. Social History   Socioeconomic History   Marital status: Married    Spouse name: Not on file   Number of children: 2   Years of education: Not on file   Highest education level: Not on file  Occupational History   Not on file  Tobacco Use   Smoking status: Every Day    Packs/day: 1.00    Years: 20.00    Total pack years: 20.00    Types: Cigarettes   Smokeless tobacco: Never  Vaping Use   Vaping Use: Never used  Substance and Sexual Activity   Alcohol use: No   Drug use: No   Sexual activity: Not on file  Other Topics Concern   Not on file  Social History  Narrative   Currently on disability.   2 children   Social Determinants of Health   Financial Resource Strain: Medium Risk (10/01/2020)   Overall Financial Resource Strain (CARDIA)    Difficulty of Paying Living Expenses: Somewhat hard  Food Insecurity: Food Insecurity Present (10/01/2020)   Hunger Vital Sign    Worried About Running Out of Food in the Last Year: Often true    Ran Out of Food in the Last Year: Often true  Transportation Needs: No Transportation Needs (10/01/2020)   PRAPARE - Hydrologist (Medical): No    Lack of Transportation (Non-Medical): No  Physical Activity: Sufficiently Active (10/01/2020)   Exercise Vital Sign    Days of Exercise per Week: 5 days    Minutes of Exercise per Session: 30 min  Stress: Stress Concern Present (10/01/2020)   Mark Goodman    Feeling of Stress : Rather much  Social Connections: Socially Integrated (10/01/2020)   Social Connection and Isolation Panel [NHANES]    Frequency of Communication with Friends and Family: More than three times a week    Frequency of Social Gatherings with Friends and Family: More than three times a week    Attends Religious Services: More than 4 times per year    Active Member of [employer] or Organizations: Yes    Attends Archivist Meetings: Never    Marital Status: Married    Tobacco Counseling Ready to quit: Not Answered Counseling given: Not Answered   Clinical Intake:                 Diabetic? No         Activities of Daily Living     No data to display           Patient Care Team: Lindell Spar, MD as PCP - General (Internal Medicine)  Indicate any recent Medical Services you may have received from other than [facility] providers in the past year (date may be approximate).     Assessment:   This is a routine wellness examination for Mark Goodman.  Hearing/Vision screen No results  found.  Dietary issues and exercise activities discussed:     Goals Addressed   None   Depression Screen    04/27/2021    2:24 PM 04/09/2021    4:28 PM 02/05/2021    2:13 PM 10/01/2020    3:37 PM 10/01/2020    3:12 PM 07/15/2020    8:01 AM 04/24/2020  2:59 PM  PHQ 2/9 Scores  PHQ - 2 Score 0 0 0 0 0 1 0    Fall Risk    04/27/2021    2:24 PM 04/09/2021    4:28 PM 02/05/2021    2:13 PM 10/01/2020    3:37 PM 10/01/2020    3:16 PM  Mark Goodman in the past year? 0 0 0 0 0  Number falls in past yr: 0 0 0 0 0  Injury with Fall? 0 0 0 0 0  Risk for fall due to : No Fall Risks No Fall Risks No Fall Risks No Fall Risks   Follow up Falls evaluation completed Falls evaluation completed Falls evaluation completed Falls evaluation completed Falls prevention discussed    FALL RISK PREVENTION PERTAINING TO THE HOME:  Any stairs in or around the home? Yes  If so, are there any without handrails? No  Home free of loose throw rugs in walkways, pet beds, electrical cords, etc? Yes  Adequate lighting in your home to reduce risk of falls? Yes   ASSISTIVE DEVICES UTILIZED TO PREVENT FALLS:  Life alert? No  Use of a cane, walker or w/c? No  Grab bars in the bathroom? No  Shower chair or bench in shower? No  Elevated toilet seat or a handicapped toilet? No    Cognitive Function:        10/01/2020    3:19 PM  6CIT Screen  What Year? 0 points  What month? 0 points  What time? 0 points  Count back from 20 0 points  Months in reverse 4 points  Repeat phrase 0 points  Total Score 4 points    Immunizations  There is no immunization history on file for this patient.  TDAP status: Due, Education has been provided regarding the importance of this vaccine. Advised may receive this vaccine at local pharmacy or Health Dept. Aware to provide a copy of the vaccination record if obtained from local pharmacy or Health Dept. Verbalized acceptance and understanding.  Flu Vaccine status:  Declined, Education has been provided regarding the importance of this vaccine but patient still declined. Advised may receive this vaccine at local pharmacy or Health Dept. Aware to provide a copy of the vaccination record if obtained from local pharmacy or Health Dept. Verbalized acceptance and understanding.    Covid-19 vaccine status: Declined, Education has been provided regarding the importance of this vaccine but patient still declined. Advised may receive this vaccine at local pharmacy or Health Dept.or vaccine clinic. Aware to provide a copy of the vaccination record if obtained from local pharmacy or Health Dept. Verbalized acceptance and understanding.  Qualifies for Shingles Vaccine? No   Zostavax completed  N/A   Shingrix Completed?: No.    Education has been provided regarding the importance of this vaccine. Patient has been advised to call insurance company to determine out of pocket expense if they have not yet received this vaccine. Advised may also receive vaccine at local pharmacy or Health Dept. Verbalized acceptance and understanding.  Screening Tests Health Maintenance  Topic Date Due   COVID-19 Vaccine (1) Never done   TETANUS/TDAP  Never done   INFLUENZA VACCINE  Never done   Hepatitis C Screening  Completed   HIV Screening  Completed   HPV VACCINES  Aged Out    Health Maintenance  Health Maintenance Due  Topic Date Due   COVID-19 Vaccine (1) Never done   TETANUS/TDAP  Never done  INFLUENZA VACCINE  Never done      Lung Cancer Screening: (Low Dose CT Chest recommended if Age 77-80 years, 30 pack-year currently smoking OR have quit w/in 15years.) does qualify.     Additional Screening:  Hepatitis C Screening: does qualify; Completed 04/24/2020  Vision Screening: Recommended annual ophthalmology exams for early detection of glaucoma and other disorders of the eye. Is the patient up to date with their annual eye exam?  No   Who is the provider or what  is the name of the office in which the patient attends annual eye exams? [facility] If pt is not established with a provider, would they like to be referred to a provider to establish care? No .   Dental Screening: Recommended annual dental exams for proper oral hygiene  Community Resource Referral / Chronic Care Management: CRR required this visit?  No   CCM required this visit?  No      Plan:     I have personally reviewed and noted the following in the patient's chart:   Medical and social history Use of alcohol, tobacco or illicit drugs  Current medications and supplements including opioid prescriptions. Patient is currently taking opioid prescriptions. Information provided to patient regarding non-opioid alternatives. Patient advised to discuss non-opioid treatment plan with their provider. Functional ability and status Nutritional status Physical activity Advanced directives List of other physicians Hospitalizations, surgeries, and ER visits in previous 12 months Vitals Screenings to include cognitive, depression, and falls Referrals and appointments  In addition, I have reviewed and discussed with patient certain preventive protocols, quality metrics, and best practice recommendations. A written personalized care plan for preventive services as well as general preventive health recommendations were provided to patient.     Mark Goodman, [place]   10/05/2021   Nurse Notes:  Mark Goodman , Thank you for taking time to come for your Medicare Wellness Visit. I appreciate your ongoing commitment to your health goals. Please review the following plan we discussed and let me know if I can assist you in the future.   These are the goals we discussed:  Goals      Quit Smoking        This is a list of the screening recommended for you and due dates:  Health Maintenance  Topic Date Due   COVID-19 Vaccine (1) Never done   Tetanus Vaccine  Never done   Flu Shot  Never  done   Hepatitis C Screening: USPSTF Recommendation to screen - Ages 56-79 yo.  Completed   HIV Screening  Completed   HPV Vaccine  Aged Out

## 2021-10-05 NOTE — Telephone Encounter (Signed)
Wants aspirin and xanax refilled.

## 2021-10-06 ENCOUNTER — Telehealth: Payer: Self-pay

## 2021-10-06 NOTE — Telephone Encounter (Signed)
   Telephone encounter was:  Successful.  10/06/2021 Name: Mark Goodman MRN: [id number] DOB: 06-03-1980  Mariea Stable [name] is a 41 y.o. year old male who is a primary care patient of Lindell Spar, MD . The community resource team was consulted for assistance with Transportation Needs , Food Insecurity, and housing, utilities.  Care guide performed the following interventions: Spoke with patient verified his home address to send resources for food, financial, transportation, utilities and housing per request.  Patient has my name and number to call if he does not receive resource letter in the next 7-10 business days. Letter saved in Epic.  Follow Up Plan:  No further follow up planned at this time. The patient has been provided with needed resources.  [name] Resource Care Guide   ??[email or web address]  ?? [phone]   Website: [email or web address]  [email or web address]  "We don't say no, we SHOW how!"         [facility]

## 2021-10-15 ENCOUNTER — Ambulatory Visit: Payer: Medicare HMO | Admitting: Internal Medicine

## 2021-10-29 DIAGNOSIS — F411 Generalized anxiety disorder: Secondary | ICD-10-CM | POA: Diagnosis not present

## 2021-10-29 DIAGNOSIS — F902 Attention-deficit hyperactivity disorder, combined type: Secondary | ICD-10-CM | POA: Diagnosis not present

## 2021-10-29 DIAGNOSIS — R69 Illness, unspecified: Secondary | ICD-10-CM | POA: Diagnosis not present

## 2021-10-29 DIAGNOSIS — F3162 Bipolar disorder, current episode mixed, moderate: Secondary | ICD-10-CM | POA: Diagnosis not present

## 2021-11-09 ENCOUNTER — Encounter: Payer: Self-pay | Admitting: Internal Medicine

## 2021-11-09 ENCOUNTER — Ambulatory Visit (INDEPENDENT_AMBULATORY_CARE_PROVIDER_SITE_OTHER): Payer: Medicare HMO | Admitting: Internal Medicine

## 2021-11-09 DIAGNOSIS — J0110 Acute frontal sinusitis, unspecified: Secondary | ICD-10-CM | POA: Diagnosis not present

## 2021-11-09 DIAGNOSIS — E782 Mixed hyperlipidemia: Secondary | ICD-10-CM

## 2021-11-09 DIAGNOSIS — G43809 Other migraine, not intractable, without status migrainosus: Secondary | ICD-10-CM | POA: Diagnosis not present

## 2021-11-09 MED ORDER — PROPRANOLOL HCL 20 MG PO TABS: 20.0000 mg | ORAL_TABLET | Freq: Two times a day (BID) | ORAL | 5 refills | Status: DC

## 2021-11-09 MED ORDER — AZITHROMYCIN 250 MG PO TABS: ORAL_TABLET | ORAL | 0 refills | Status: AC

## 2021-11-09 MED ORDER — ATORVASTATIN CALCIUM 20 MG PO TABS: 20.0000 mg | ORAL_TABLET | Freq: Every day | ORAL | 1 refills | Status: DC

## 2021-11-09 NOTE — Progress Notes (Signed)
Virtual Visit via Telephone Note   This visit type was conducted via telephone. This format is felt to be most appropriate for this patient at this time.  The patient did not have access to video technology/had technical difficulties with video requiring transitioning to audio format only (telephone).  All issues noted in this document were discussed and addressed.  No physical exam could be performed with this format.  Evaluation Performed:  Follow-up visit  Date:  11/09/2021   ID:  Mark Goodman, DOB 1980/09/26, MRN [id number]  Patient Location: Home Provider Location: Office/Clinic  Participants: Patient Location of Patient: Home Location of Provider: Telehealth Consent was obtain for visit to be over via telehealth. I verified that I am speaking with the correct person using two identifiers.  PCP:  Lindell Spar, MD   Chief Complaint: Cough, nasal congestion, sinus pressure related headache and fever  History of Present Illness:    Mark Goodman is a 41 y.o. male who has a televisit for complaint of cough, nasal congestion, sinus pressure related headache and fever for the last 1 week.  He has tried Mucinex without much relief.  His fever has resolved now.  Denies any chills, dyspnea or wheezing currently.  The patient does not have symptoms concerning for COVID-19 infection (fever, chills, cough, or new shortness of breath).   Past Medical, Surgical, Social History, Allergies, and Medications have been Reviewed.  Past Medical History:  Diagnosis Date   ADHD (attention deficit hyperactivity disorder)    Bipolar 1 disorder (HCC)    Cleft uvula    Hypertension    PTSD (post-traumatic stress disorder)    Past Surgical History:  Procedure Laterality Date   EXTERNAL EAR SURGERY       Current Meds  Medication Sig   ALPRAZolam (XANAX) 1 MG tablet Take 0.5-1 mg by mouth daily as needed.   ARIPiprazole (ABILIFY) 10 MG tablet Take 1 tablet (10 mg total) by  mouth every morning. Take 10 mg by mouth every morning.   aspirin EC 81 MG tablet Take 1 tablet (81 mg total) by mouth daily. Swallow whole.   atorvastatin (LIPITOR) 20 MG tablet Take 1 tablet (20 mg total) by mouth daily.   azithromycin (ZITHROMAX) 500 MG tablet Take 1 tablet (500 mg total) by mouth daily.   busPIRone (BUSPAR) 10 MG tablet Take 1 tablet (10 mg total) by mouth 3 (three) times daily. Take 10 mg by mouth 3 (three) times daily.   fluticasone (FLONASE) 50 MCG/ACT nasal spray SMARTSIG:1-2 Spray(s) Both Nares Daily PRN   hydrOXYzine (ATARAX) 50 MG tablet TAKE 1 TABLET BY MOUTH ONCE AT BEDTIME AS NEEDED FOR ANXIETY.   olmesartan-hydrochlorothiazide (BENICAR HCT) 20-12.5 MG tablet Take 1 tablet by mouth daily.   omeprazole (PRILOSEC) 40 MG capsule Take 1 capsule (40 mg total) by mouth daily.   propranolol (INDERAL) 20 MG tablet Take 1 tablet (20 mg total) by mouth 2 (two) times daily.   Vitamin D, Ergocalciferol, (DRISDOL) 1.25 MG (50000 UNIT) CAPS capsule Take 1 capsule (50,000 Units total) by mouth once a week.     Allergies:   Tomato and Penicillins   ROS:   Please see the history of present illness.     All other systems reviewed and are negative.   Labs/Other Tests and Data Reviewed:    Recent Labs: No results found for requested labs within last 365 days.   Recent Lipid Panel Lab Results  Component Value Date/Time  CHOL 202 (H) 04/24/2020 03:43 PM   TRIG 238 (H) 04/24/2020 03:43 PM   HDL 31 (L) 04/24/2020 03:43 PM   CHOLHDL 6.5 (H) 04/24/2020 03:43 PM   LDLCALC 128 (H) 04/24/2020 03:43 PM    Wt Readings from Last 3 Encounters:  04/09/21 (!) 324 lb 12.8 oz (147.3 kg)  10/01/20 (!) 312 lb (141.5 kg)  10/01/20 (!) 312 lb (141.5 kg)     ASSESSMENT & PLAN:    Acute sinusitis Started empiric azithromycin as he has persistent symptoms despite symptomatic treatment Mucinex or Robitussin as needed for cough Nasal saline spray as needed for nasal  congestion  Migraine Propranolol refilled  HLD Lipitor refilled  Time:   Today, I have spent 9 minutes reviewing the chart, including problem list, medications, and with the patient with telehealth technology discussing the above problems.   Medication Adjustments/Labs and Tests Ordered: Current medicines are reviewed at length with the patient today.  Concerns regarding medicines are outlined above.   Tests Ordered: No orders of the defined types were placed in this encounter.   Medication Changes: No orders of the defined types were placed in this encounter.    Note: This dictation was prepared with Dragon dictation along with smaller phrase technology. Similar sounding words can be transcribed inadequately or may not be corrected upon review. Any transcriptional errors that result from this process are unintentional.      Disposition:  Follow up  Signed, Anabel Halon, MD  11/09/2021 2:38 PM     [facility]

## 2021-11-09 NOTE — Patient Instructions (Signed)
Please start taking Azithromycin as prescribed.  Please take Mucinex DM or Robitussin-DM Max as needed for cough.

## 2021-11-23 ENCOUNTER — Encounter: Payer: Self-pay | Admitting: Internal Medicine

## 2021-11-23 ENCOUNTER — Ambulatory Visit: Payer: Medicare HMO | Admitting: Internal Medicine

## 2021-12-15 ENCOUNTER — Encounter: Payer: Self-pay | Admitting: Internal Medicine

## 2021-12-15 ENCOUNTER — Ambulatory Visit (INDEPENDENT_AMBULATORY_CARE_PROVIDER_SITE_OTHER): Payer: Medicare HMO | Admitting: Internal Medicine

## 2021-12-15 VITALS — BP 113/70 | HR 68 | Ht 72.0 in | Wt 271.8 lb

## 2021-12-15 DIAGNOSIS — G43809 Other migraine, not intractable, without status migrainosus: Secondary | ICD-10-CM

## 2021-12-15 DIAGNOSIS — Z2821 Immunization not carried out because of patient refusal: Secondary | ICD-10-CM

## 2021-12-15 DIAGNOSIS — F319 Bipolar disorder, unspecified: Secondary | ICD-10-CM | POA: Diagnosis not present

## 2021-12-15 DIAGNOSIS — E782 Mixed hyperlipidemia: Secondary | ICD-10-CM | POA: Diagnosis not present

## 2021-12-15 DIAGNOSIS — Z720 Tobacco use: Secondary | ICD-10-CM

## 2021-12-15 DIAGNOSIS — E559 Vitamin D deficiency, unspecified: Secondary | ICD-10-CM | POA: Diagnosis not present

## 2021-12-15 DIAGNOSIS — N529 Male erectile dysfunction, unspecified: Secondary | ICD-10-CM | POA: Insufficient documentation

## 2021-12-15 DIAGNOSIS — I10 Essential (primary) hypertension: Secondary | ICD-10-CM | POA: Diagnosis not present

## 2021-12-15 DIAGNOSIS — R7303 Prediabetes: Secondary | ICD-10-CM

## 2021-12-15 DIAGNOSIS — R69 Illness, unspecified: Secondary | ICD-10-CM | POA: Diagnosis not present

## 2021-12-15 DIAGNOSIS — G4733 Obstructive sleep apnea (adult) (pediatric): Secondary | ICD-10-CM

## 2021-12-15 MED ORDER — SILDENAFIL CITRATE 50 MG PO TABS: 50.0000 mg | ORAL_TABLET | Freq: Every day | ORAL | 0 refills | Status: DC | PRN

## 2021-12-15 NOTE — Assessment & Plan Note (Signed)
Followed by [facility] On Abilify, BuSpar and Xanax as needed Continue Atarax as needed for anxiety/insomnia

## 2021-12-15 NOTE — Assessment & Plan Note (Signed)
Smokes 1.5 pack/day  Asked about quitting: confirms that he currently smokes cigarettes Advise to quit smoking: Educated about QUITTING to reduce the risk of cancer, cardio and cerebrovascular disease. Assess willingness: Unwilling to quit at this time, but is working on cutting back. Assist with counseling and pharmacotherapy: Counseled for 5 minutes and literature provided. Arrange for follow up: follow up in 3 months and continue to offer help.

## 2021-12-15 NOTE — Assessment & Plan Note (Signed)
BMI Readings from Last 3 Encounters:  12/15/21 36.86 kg/m  04/09/21 44.05 kg/m  10/01/20 42.31 kg/m   Associated with HTN, GERD, bipolar depression and HLD Has lost about 54 lbs with alcohol cessation and diet modification Moderate exercise advised

## 2021-12-15 NOTE — Assessment & Plan Note (Signed)
STOP-BANG: 6 Very high risk for OSA Had ordered home sleep test, but he did not get it done due to cost concern Needs to continue to work on losing weight Side sleeping positions for now

## 2021-12-15 NOTE — Assessment & Plan Note (Signed)
Was On Gemfibrozil Check lipid profile

## 2021-12-15 NOTE — Patient Instructions (Signed)
Please continue taking medications as prescribed. ° °Please continue to follow low carb diet and ambulate as tolerated. ° °Please try to cut down -> quit smoking. °

## 2021-12-15 NOTE — Assessment & Plan Note (Signed)
BP Readings from Last 1 Encounters:  12/15/21 113/70   Well-controlled with Olmesartan-HCTZ 20-12.5 mg QD Counseled for compliance with the medications Advised DASH diet and moderate exercise/walking, at least 150 mins/week

## 2021-12-15 NOTE — Assessment & Plan Note (Signed)
Could be due to psychiatric medications Trial of sildenafil as needed

## 2021-12-15 NOTE — Progress Notes (Signed)
Established Patient Office Visit  Subjective:  Patient ID: Mark Goodman, male    DOB: [birth year]  Age: 41 y.o. MRN: [id number]  CC:  Chief Complaint  Patient presents with   Follow-up    Follow up requesting labs, wants to discuss personal issue    HPI Mark Goodman is a 41 y.o. male with past medical history of HTN, HLD, Bipolar disorder, anxiety, GERD and tobacco abuse who presents for f/u of his chronic medical conditions.  HTN: BP is well-controlled now. Takes medications regularly. Patient denies headache, dizziness, chest pain, dyspnea or palpitations.   He follows up with [facility] for bipolar disorder and anxiety.  He is on Abilify, as needed Xanax and BuSpar currently.  He continues to take Atarax as needed for anxiety/insomnia.  He feels better now.  Denies any anhedonia, SI or HI.   He has not been able to get sleep study yet due to cost concern. He has lost about 53 lbs since the last visit with alcohol cessation and diet modification.  He has increased smoking to 1.5 pack/day due to recent stress from divorce.  He reports difficulty maintaining erection during sexual activity.  Denies any dysuria, hematuria or urethral discharge.   Past Medical History:  Diagnosis Date   ADHD (attention deficit hyperactivity disorder)    Bipolar 1 disorder (HCC)    Cleft uvula    Hypertension    PTSD (post-traumatic stress disorder)     Past Surgical History:  Procedure Laterality Date   EXTERNAL EAR SURGERY      History reviewed. No pertinent family history.  Social History   Socioeconomic History   Marital status: Married    Spouse name: Not on file   Number of children: 2   Years of education: Not on file   Highest education level: Not on file  Occupational History   Not on file  Tobacco Use   Smoking status: Every Day    Packs/day: 1.00    Years: 20.00    Total pack years: 20.00    Types: Cigarettes   Smokeless tobacco: Never   Vaping Use   Vaping Use: Never used  Substance and Sexual Activity   Alcohol use: No   Drug use: No   Sexual activity: Not on file  Other Topics Concern   Not on file  Social History Narrative   Currently on disability.   2 children   Social Determinants of Health   Financial Resource Strain: Medium Risk (10/05/2021)   Overall Financial Resource Strain (CARDIA)    Difficulty of Paying Living Expenses: Somewhat hard  Food Insecurity: Food Insecurity Present (10/06/2021)   Hunger Vital Sign    Worried About Running Out of Food in the Last Year: Often true    Ran Out of Food in the Last Year: Often true  Transportation Needs: Unmet Transportation Needs (10/06/2021)   PRAPARE - Hydrologist (Medical): Yes    Lack of Transportation (Non-Medical): Yes  Physical Activity: Sufficiently Active (10/05/2021)   Exercise Vital Sign    Days of Exercise per Week: 5 days    Minutes of Exercise per Session: 30 min  Stress: Stress Concern Present (10/05/2021)   [place]    Feeling of Stress : Rather much  Social Connections: Moderately Isolated (10/05/2021)   Social Connection and Isolation Panel [NHANES]    Frequency of Communication with Friends and Family: More  than three times a week    Frequency of Social Gatherings with Friends and Family: More than three times a week    Attends Religious Services: More than 4 times per year    Active Member of [employer] or Organizations: No    Attends Archivist Meetings: Never    Marital Status: Separated  Intimate Partner Violence: Not At Risk (10/05/2021)   Humiliation, Afraid, Rape, and Kick questionnaire    Fear of Current or Ex-Partner: No    Emotionally Abused: No    Physically Abused: No    Sexually Abused: No    Outpatient Medications Prior to Visit  Medication Sig Dispense Refill   ALPRAZolam (XANAX) 1 MG tablet Take 0.5-1 mg by mouth  daily as needed.     ARIPiprazole (ABILIFY) 10 MG tablet Take 1 tablet (10 mg total) by mouth every morning. Take 10 mg by mouth every morning. 30 tablet 0   aspirin EC 81 MG tablet Take 1 tablet (81 mg total) by mouth daily. Swallow whole. 30 tablet 11   atorvastatin (LIPITOR) 20 MG tablet Take 1 tablet (20 mg total) by mouth daily. 90 tablet 1   busPIRone (BUSPAR) 10 MG tablet Take 1 tablet (10 mg total) by mouth 3 (three) times daily. Take 10 mg by mouth 3 (three) times daily. 30 tablet 0   fluticasone (FLONASE) 50 MCG/ACT nasal spray SMARTSIG:1-2 Spray(s) Both Nares Daily PRN 16 g 1   hydrOXYzine (ATARAX) 50 MG tablet TAKE 1 TABLET BY MOUTH ONCE AT BEDTIME AS NEEDED FOR ANXIETY. 30 tablet 0   olmesartan-hydrochlorothiazide (BENICAR HCT) 20-12.5 MG tablet Take 1 tablet by mouth daily. 90 tablet 1   omeprazole (PRILOSEC) 40 MG capsule Take 1 capsule (40 mg total) by mouth daily. 90 capsule 3   propranolol (INDERAL) 20 MG tablet Take 1 tablet (20 mg total) by mouth 2 (two) times daily. 60 tablet 5   Vitamin D, Ergocalciferol, (DRISDOL) 1.25 MG (50000 UNIT) CAPS capsule Take 1 capsule (50,000 Units total) by mouth once a week. 5 capsule 0   No facility-administered medications prior to visit.      ROS Review of Systems  Constitutional:  Negative for chills and fever.  HENT:  Negative for congestion and sore throat.   Eyes:  Negative for pain and discharge.  Respiratory:  Negative for cough and shortness of breath.   Cardiovascular:  Negative for chest pain and palpitations.  Gastrointestinal:  Negative for constipation, diarrhea, nausea and vomiting.  Endocrine: Negative for polydipsia and polyuria.  Genitourinary:  Negative for dysuria and hematuria.  Musculoskeletal:  Negative for neck pain and neck stiffness.  Skin:  Negative for rash.  Neurological:  Negative for dizziness, weakness, numbness and headaches.  Psychiatric/Behavioral:  Negative for agitation, behavioral problems and  sleep disturbance. The patient is not nervous/anxious.       Objective:    Physical Exam Vitals reviewed.  Constitutional:      General: He is not in acute distress.    Appearance: He is obese. He is not diaphoretic.  HENT:     Head: Normocephalic and atraumatic.     Nose: Nose normal.     Mouth/Throat:     Mouth: Mucous membranes are moist.  Eyes:     General: No scleral icterus.    Extraocular Movements: Extraocular movements intact.  Cardiovascular:     Rate and Rhythm: Normal rate and regular rhythm.     Pulses: Normal pulses.     Heart sounds:  Normal heart sounds. No murmur heard. Pulmonary:     Breath sounds: Normal breath sounds. No wheezing or rales.  Abdominal:     Palpations: Abdomen is soft.     Tenderness: There is no abdominal tenderness.  Musculoskeletal:     Cervical back: Neck supple. No tenderness.     Right lower leg: No edema.     Left lower leg: No edema.  Skin:    General: Skin is warm.     Findings: No rash.  Neurological:     General: No focal deficit present.     Mental Status: He is alert and oriented to person, place, and time.     Sensory: No sensory deficit.     Motor: No weakness.  Psychiatric:        Mood and Affect: Mood normal.        Behavior: Behavior normal.        Cognition and Memory: Cognition normal.     BP 113/70 (BP Location: Left Arm, Patient Position: Sitting, Cuff Size: Large)   Pulse 68   Ht 6' (1.829 m)   Wt 271 lb 12.8 oz (123.3 kg)   SpO2 96%   BMI 36.86 kg/m  Wt Readings from Last 3 Encounters:  12/15/21 271 lb 12.8 oz (123.3 kg)  04/09/21 (!) 324 lb 12.8 oz (147.3 kg)  10/01/20 (!) 312 lb (141.5 kg)    Lab Results  Component Value Date   TSH 1.700 04/24/2020   Lab Results  Component Value Date   WBC 14.7 (H) 05/21/2020   HGB 14.3 05/21/2020   HCT 43.0 05/21/2020   MCV 92.9 05/21/2020   PLT 344 05/21/2020   Lab Results  Component Value Date   NA 136 05/21/2020   K 3.8 05/21/2020   CO2 26  05/21/2020   GLUCOSE 96 05/21/2020   BUN 19 05/21/2020   CREATININE 1.27 (H) 05/21/2020   BILITOT 0.9 05/21/2020   ALKPHOS 110 05/21/2020   AST 23 05/21/2020   ALT 31 05/21/2020   PROT 7.2 05/21/2020   ALBUMIN 3.8 05/21/2020   CALCIUM 8.9 05/21/2020   ANIONGAP 8 05/21/2020   EGFR 102 04/24/2020   Lab Results  Component Value Date   CHOL 202 (H) 04/24/2020   Lab Results  Component Value Date   HDL 31 (L) 04/24/2020   Lab Results  Component Value Date   LDLCALC 128 (H) 04/24/2020   Lab Results  Component Value Date   TRIG 238 (H) 04/24/2020   Lab Results  Component Value Date   CHOLHDL 6.5 (H) 04/24/2020   Lab Results  Component Value Date   HGBA1C 6.2 (H) 04/24/2020      Assessment & Plan:   Problem List Items Addressed This Visit       Cardiovascular and Mediastinum   Migraine    On Propranolol for ppx Tylenol PRN      Relevant Medications   sildenafil (VIAGRA) 50 MG tablet   Primary hypertension - Primary    BP Readings from Last 1 Encounters:  12/15/21 113/70  Well-controlled with Olmesartan-HCTZ 20-12.5 mg QD Counseled for compliance with the medications Advised DASH diet and moderate exercise/walking, at least 150 mins/week      Relevant Medications   sildenafil (VIAGRA) 50 MG tablet   Other Relevant Orders   TSH   CMP14+EGFR   CBC with Differential/Platelet     Respiratory   Obstructive sleep apnea syndrome    STOP-BANG: 6 Very high risk for OSA Had  ordered home sleep test, but he did not get it done due to cost concern Needs to continue to work on losing weight Side sleeping positions for now        Other   Bipolar disorder ([place])    Followed by [facility] On Abilify, BuSpar and Xanax as needed Continue Atarax as needed for anxiety/insomnia      Relevant Orders   TSH   Mixed hyperlipidemia    Was On Gemfibrozil Check lipid profile      Relevant Medications   sildenafil (VIAGRA) 50 MG tablet   Other  Relevant Orders   Lipid panel   Tobacco abuse    Smokes 1.5 pack/day  Asked about quitting: confirms that he currently smokes cigarettes Advise to quit smoking: Educated about QUITTING to reduce the risk of cancer, cardio and cerebrovascular disease. Assess willingness: Unwilling to quit at this time, but is working on cutting back. Assist with counseling and pharmacotherapy: Counseled for 5 minutes and literature provided. Arrange for follow up: follow up in 3 months and continue to offer help.      Erectile dysfunction    Could be due to psychiatric medications Trial of sildenafil as needed      Relevant Medications   sildenafil (VIAGRA) 50 MG tablet   Morbid obesity (HCC)    BMI Readings from Last 3 Encounters:  12/15/21 36.86 kg/m  04/09/21 44.05 kg/m  10/01/20 42.31 kg/m  Associated with HTN, GERD, bipolar depression and HLD Has lost about 54 lbs with alcohol cessation and diet modification Moderate exercise advised      Other Visit Diagnoses     Vitamin D deficiency       Relevant Orders   VITAMIN D 25 Hydroxy (Vit-D Deficiency, Fractures)   Influenza vaccination declined       Prediabetes       Relevant Orders   Hemoglobin A1c   CMP14+EGFR       Meds ordered this encounter  Medications   sildenafil (VIAGRA) 50 MG tablet    Sig: Take 1 tablet (50 mg total) by mouth daily as needed for erectile dysfunction.    Dispense:  10 tablet    Refill:  0    Follow-up: Return in about 4 months (around 04/15/2022) for Annual physical.    Lindell Spar, MD

## 2021-12-15 NOTE — Assessment & Plan Note (Signed)
On Propranolol for ppx Tylenol PRN

## 2021-12-28 ENCOUNTER — Emergency Department (HOSPITAL_COMMUNITY)
Admission: EM | Admit: 2021-12-28 | Discharge: 2021-12-28 | Discharge status: Against Medical Advice | Payer: Medicare HMO

## 2021-12-28 ENCOUNTER — Encounter: Payer: Self-pay | Admitting: Internal Medicine

## 2021-12-28 ENCOUNTER — Ambulatory Visit (INDEPENDENT_AMBULATORY_CARE_PROVIDER_SITE_OTHER): Payer: Medicare HMO | Admitting: Internal Medicine

## 2021-12-28 VITALS — Temp 102.1°F

## 2021-12-28 DIAGNOSIS — Z20822 Contact with and (suspected) exposure to covid-19: Secondary | ICD-10-CM

## 2021-12-28 DIAGNOSIS — J209 Acute bronchitis, unspecified: Secondary | ICD-10-CM | POA: Diagnosis not present

## 2021-12-28 MED ORDER — NOREL AD 4-10-325 MG PO TABS: 1.0000 | ORAL_TABLET | Freq: Three times a day (TID) | ORAL | 0 refills | Status: DC | PRN

## 2021-12-28 MED ORDER — ALBUTEROL SULFATE HFA 108 (90 BASE) MCG/ACT IN AERS: 2.0000 | INHALATION_SPRAY | Freq: Four times a day (QID) | RESPIRATORY_TRACT | 0 refills | Status: DC | PRN

## 2021-12-28 MED ORDER — METHYLPREDNISOLONE 4 MG PO TBPK: ORAL_TABLET | ORAL | 0 refills | Status: DC

## 2021-12-28 NOTE — Progress Notes (Signed)
Virtual Visit via Telephone Note   This visit type was conducted via telephone. This format is felt to be most appropriate for this patient at this time.  The patient did not have access to video technology/had technical difficulties with video requiring transitioning to audio format only (telephone).  All issues noted in this document were discussed and addressed.  No physical exam could be performed with this format.  Evaluation Performed:  Follow-up visit  Date:  12/28/2021   ID:  Mark Goodman, DOB 03/24/1980, MRN [id number]  Patient Location: Home Provider Location: Office/Clinic  Participants: Patient Location of Patient: Home Location of Provider: Telehealth Consent was obtain for visit to be over via telehealth. I verified that I am speaking with the correct person using two identifiers.  PCP:  Anabel Halon, MD   Chief Complaint: Fever and nasal congestion  History of Present Illness:    Mark Goodman is a 41 y.o. male who has a televisit for complaint of fever, chills, myalgias, nasal congestion and cough for the last 3 days.  He also reports mild wheezing.  He has tried taking Mucinex without much relief.  Denies any recent sick contacts.  He has not had flu or COVID vaccines.  His home COVID test was negative today.  The patient does have symptoms concerning for COVID-19 infection (fever, chills, cough, or new shortness of breath).   Past Medical, Surgical, Social History, Allergies, and Medications have been Reviewed.  Past Medical History:  Diagnosis Date   ADHD (attention deficit hyperactivity disorder)    Bipolar 1 disorder (HCC)    Cleft uvula    Hypertension    PTSD (post-traumatic stress disorder)    Past Surgical History:  Procedure Laterality Date   EXTERNAL EAR SURGERY       Current Meds  Medication Sig   albuterol (VENTOLIN HFA) 108 (90 Base) MCG/ACT inhaler Inhale 2 puffs into the lungs every 6 (six) hours as needed for wheezing  or shortness of breath.   Chlorphen-PE-Acetaminophen (NOREL AD) 4-10-325 MG TABS Take 1 tablet by mouth 3 (three) times daily as needed (Nasal congestion).   methylPREDNISolone (MEDROL DOSEPAK) 4 MG TBPK tablet Take as package instructions.     Allergies:   Tomato and Penicillins   ROS:   Please see the history of present illness.     All other systems reviewed and are negative.   Labs/Other Tests and Data Reviewed:    Recent Labs: No results found for requested labs within last 365 days.   Recent Lipid Panel Lab Results  Component Value Date/Time   CHOL 202 (H) 04/24/2020 03:43 PM   TRIG 238 (H) 04/24/2020 03:43 PM   HDL 31 (L) 04/24/2020 03:43 PM   CHOLHDL 6.5 (H) 04/24/2020 03:43 PM   LDLCALC 128 (H) 04/24/2020 03:43 PM    Wt Readings from Last 3 Encounters:  12/15/21 271 lb 12.8 oz (123.3 kg)  04/09/21 (!) 324 lb 12.8 oz (147.3 kg)  10/01/20 (!) 312 lb (141.5 kg)     ASSESSMENT & PLAN:    Acute bronchitis Suspected COVID-19 infection Check flu and COVID RT-PCR Medrol Dosepak considering he has mild dyspnea and wheezing Albuterol as needed for dyspnea or wheezing Continue Mucinex as needed for cough Norel as needed for nasal congestion If negative viral testing and persistent symptoms, will start antibiotic Needs to avoid smoking     Time:   Today, I have spent 12 minutes reviewing the chart, including  problem list, medications, and with the patient with telehealth technology discussing the above problems.   Medication Adjustments/Labs and Tests Ordered: Current medicines are reviewed at length with the patient today.  Concerns regarding medicines are outlined above.   Tests Ordered: No orders of the defined types were placed in this encounter.   Medication Changes: Meds ordered this encounter  Medications   methylPREDNISolone (MEDROL DOSEPAK) 4 MG TBPK tablet    Sig: Take as package instructions.    Dispense:  1 each    Refill:  0   albuterol  (VENTOLIN HFA) 108 (90 Base) MCG/ACT inhaler    Sig: Inhale 2 puffs into the lungs every 6 (six) hours as needed for wheezing or shortness of breath.    Dispense:  8 g    Refill:  0    Okay to substitute to generic/formulary Albuterol.   Chlorphen-PE-Acetaminophen (NOREL AD) 4-10-325 MG TABS    Sig: Take 1 tablet by mouth 3 (three) times daily as needed (Nasal congestion).    Dispense:  30 tablet    Refill:  0     Note: This dictation was prepared with Dragon dictation along with smaller phrase technology. Similar sounding words can be transcribed inadequately or may not be corrected upon review. Any transcriptional errors that result from this process are unintentional.      Disposition:  Follow up  Signed, Anabel Halon, MD  12/28/2021 12:14 PM     [facility]

## 2021-12-28 NOTE — Patient Instructions (Signed)
Please come to office for flu and COVID tests.

## 2022-02-17 ENCOUNTER — Telehealth: Payer: Self-pay | Admitting: Internal Medicine

## 2022-02-17 ENCOUNTER — Other Ambulatory Visit: Payer: Self-pay

## 2022-02-17 DIAGNOSIS — N529 Male erectile dysfunction, unspecified: Secondary | ICD-10-CM

## 2022-02-17 MED ORDER — SILDENAFIL CITRATE 50 MG PO TABS: 50.0000 mg | ORAL_TABLET | Freq: Every day | ORAL | 0 refills | Status: DC | PRN

## 2022-02-17 NOTE — Telephone Encounter (Signed)
Refills sent to pharmacy. 

## 2022-02-17 NOTE — Telephone Encounter (Signed)
Prescription Request  02/17/2022  Is this a "Controlled Substance" medicine? No  LOV: 12/28/2021  What is the name of the medication or equipment? sildenafil (VIAGRA) 50 MG tablet   Have you contacted your pharmacy to request a refill? Yes   Which pharmacy would you like this sent to?  [place] [place] [facility] [address] [id number] [place] [address] Phone: [phone] Fax: [phone]    Patient notified that their request is being sent to the clinical staff for review and that they should receive a response within 2 business days.   Please advise at Mobile [phone] (mobile)    NEEDS ASAP TODAY GOING OUT OF TOWN TOMORROW

## 2022-04-15 ENCOUNTER — Encounter: Payer: Medicare HMO | Admitting: Internal Medicine

## 2022-04-16 ENCOUNTER — Telehealth: Payer: Self-pay | Admitting: Internal Medicine

## 2022-04-16 NOTE — Telephone Encounter (Signed)
Pt has missed 3 appts (10/15/21, 11/23/21 & 04/15/22). How would you like to proceed?

## 2022-04-26 DIAGNOSIS — H52223 Regular astigmatism, bilateral: Secondary | ICD-10-CM | POA: Diagnosis not present

## 2022-05-01 ENCOUNTER — Other Ambulatory Visit: Payer: Self-pay | Admitting: Internal Medicine

## 2022-05-01 DIAGNOSIS — K219 Gastro-esophageal reflux disease without esophagitis: Secondary | ICD-10-CM

## 2022-05-12 DIAGNOSIS — E559 Vitamin D deficiency, unspecified: Secondary | ICD-10-CM | POA: Diagnosis not present

## 2022-05-12 DIAGNOSIS — E782 Mixed hyperlipidemia: Secondary | ICD-10-CM | POA: Diagnosis not present

## 2022-05-12 DIAGNOSIS — R7303 Prediabetes: Secondary | ICD-10-CM | POA: Diagnosis not present

## 2022-05-12 DIAGNOSIS — R69 Illness, unspecified: Secondary | ICD-10-CM | POA: Diagnosis not present

## 2022-05-12 DIAGNOSIS — I10 Essential (primary) hypertension: Secondary | ICD-10-CM | POA: Diagnosis not present

## 2022-05-13 ENCOUNTER — Encounter: Payer: Self-pay | Admitting: Internal Medicine

## 2022-05-13 ENCOUNTER — Ambulatory Visit (INDEPENDENT_AMBULATORY_CARE_PROVIDER_SITE_OTHER): Payer: Medicare HMO | Admitting: Internal Medicine

## 2022-05-13 VITALS — BP 138/82 | HR 69 | Ht 72.0 in | Wt 249.4 lb

## 2022-05-13 DIAGNOSIS — H65192 Other acute nonsuppurative otitis media, left ear: Secondary | ICD-10-CM | POA: Insufficient documentation

## 2022-05-13 DIAGNOSIS — Z23 Encounter for immunization: Secondary | ICD-10-CM | POA: Diagnosis not present

## 2022-05-13 DIAGNOSIS — Z720 Tobacco use: Secondary | ICD-10-CM | POA: Diagnosis not present

## 2022-05-13 DIAGNOSIS — E782 Mixed hyperlipidemia: Secondary | ICD-10-CM

## 2022-05-13 DIAGNOSIS — N529 Male erectile dysfunction, unspecified: Secondary | ICD-10-CM | POA: Diagnosis not present

## 2022-05-13 DIAGNOSIS — G43809 Other migraine, not intractable, without status migrainosus: Secondary | ICD-10-CM

## 2022-05-13 DIAGNOSIS — H65191 Other acute nonsuppurative otitis media, right ear: Secondary | ICD-10-CM | POA: Insufficient documentation

## 2022-05-13 DIAGNOSIS — Z0001 Encounter for general adult medical examination with abnormal findings: Secondary | ICD-10-CM | POA: Diagnosis not present

## 2022-05-13 DIAGNOSIS — G4733 Obstructive sleep apnea (adult) (pediatric): Secondary | ICD-10-CM

## 2022-05-13 DIAGNOSIS — F319 Bipolar disorder, unspecified: Secondary | ICD-10-CM

## 2022-05-13 DIAGNOSIS — I10 Essential (primary) hypertension: Secondary | ICD-10-CM

## 2022-05-13 DIAGNOSIS — S51812A Laceration without foreign body of left forearm, initial encounter: Secondary | ICD-10-CM | POA: Insufficient documentation

## 2022-05-13 MED ORDER — OLMESARTAN MEDOXOMIL-HCTZ 20-12.5 MG PO TABS: 1.0000 | ORAL_TABLET | Freq: Every day | ORAL | 1 refills | Status: DC

## 2022-05-13 MED ORDER — ATORVASTATIN CALCIUM 20 MG PO TABS: 20.0000 mg | ORAL_TABLET | Freq: Every day | ORAL | 1 refills | Status: DC

## 2022-05-13 MED ORDER — OFLOXACIN 0.3 % OT SOLN
5.0000 [drp] | Freq: Every day | OTIC | 0 refills | Status: DC
Start: 2022-05-13 — End: 2022-10-05

## 2022-05-13 MED ORDER — SILDENAFIL CITRATE 50 MG PO TABS
100.0000 mg | ORAL_TABLET | ORAL | 2 refills | Status: DC | PRN
Start: 2022-05-13 — End: 2022-11-23

## 2022-05-13 MED ORDER — PROPRANOLOL HCL 20 MG PO TABS: 20.0000 mg | ORAL_TABLET | Freq: Two times a day (BID) | ORAL | 5 refills | Status: DC

## 2022-05-13 NOTE — Assessment & Plan Note (Addendum)
Smokes 1 pack/day  Asked about quitting: confirms that he currently smokes cigarettes Advise to quit smoking: Educated about QUITTING to reduce the risk of cancer, cardio and cerebrovascular disease. Assess willingness: Unwilling to quit at this time, but is working on cutting back. Assist with counseling and pharmacotherapy: Counseled for 5 minutes and literature provided. Arrange for follow up: follow up in 3 months and continue to offer help.  7 mins spent counseling.

## 2022-05-13 NOTE — Assessment & Plan Note (Signed)
Could be due to psychiatric medications Has sildenafil as needed, but has not had optimal response with 50 mg dose Increased dose of sildenafil to 100 mg PRN

## 2022-05-13 NOTE — Progress Notes (Addendum)
Established Patient Office Visit  Subjective:  Patient ID: Mark Goodman, male    DOB: [birth year]  Age: 41 y.o. MRN: [id number]  CC:  Chief Complaint  Patient presents with   Annual Exam    Patient states his left ear is draining. He has a spot on the back of his neck, also spot under both armpits.    HPI Mark Goodman is a 42 y.o. male with past medical history of HTN, HLD, Bipolar disorder, anxiety, GERD and tobacco abuse who presents for annual physical.  HTN: BP is well-controlled now. Takes medications regularly. Patient denies headache, dizziness, chest pain, dyspnea or palpitations.   He follows [facility] with [facility] for bipolar disorder and anxiety, but needs follow-up.  He was on Abilify, as needed Xanax and BuSpar currently, but has stopped taking them. He takes Xanax occasionally.  He feels better now since meeting her current fiance.  Denies any anhedonia, SI or HI.  He has not been able to get sleep study yet due to cost concern. He has lost about 22 lbs since the last visit with alcohol cessation and diet modification.  He has cut down smoking to 1 pack/day from 1.5 pack/day.  He reports having a skin lesion over back of his neck, which appears to be benign cyst.  He denies any recent change in size, shape or color.  He also reports having a skin lesion in the left armpit, which is a skin tag.  He complains of left ear discharge, whitish, for the last 2 weeks.  He also reports mild left ear pain and hearing difficulty.  Denies any fever or chills.  Denies nasal congestion, postnasal drip or sore throat currently.   Past Medical History:  Diagnosis Date   ADHD (attention deficit hyperactivity disorder)    Bipolar 1 disorder (HCC)    Cleft uvula    Hypertension    PTSD (post-traumatic stress disorder)     Past Surgical History:  Procedure Laterality Date   EXTERNAL EAR SURGERY      History reviewed. No pertinent family history.  Social  History   Socioeconomic History   Marital status: Married    Spouse name: Not on file   Number of children: 2   Years of education: Not on file   Highest education level: Not on file  Occupational History   Not on file  Tobacco Use   Smoking status: Every Day    Packs/day: 1.00    Years: 20.00    Additional pack years: 0.00    Total pack years: 20.00    Types: Cigarettes   Smokeless tobacco: Never  Vaping Use   Vaping Use: Never used  Substance and Sexual Activity   Alcohol use: No   Drug use: No   Sexual activity: Not on file  Other Topics Concern   Not on file  Social History Narrative   Currently on disability.   2 children   Social Determinants of Health   Financial Resource Strain: Medium Risk (10/05/2021)   Overall Financial Resource Strain (CARDIA)    Difficulty of Paying Living Expenses: Somewhat hard  Food Insecurity: Food Insecurity Present (10/06/2021)   Hunger Vital Sign    Worried About Running Out of Food in the Last Year: Often true    Ran Out of Food in the Last Year: Often true  Transportation Needs: Unmet Transportation Needs (10/06/2021)   PRAPARE - Administrator, Civil Service (Medical): Yes  Lack of Transportation (Non-Medical): Yes  Physical Activity: Sufficiently Active (10/05/2021)   Exercise Vital Sign    Days of Exercise per Week: 5 days    Minutes of Exercise per Session: 30 min  Stress: Stress Concern Present (10/05/2021)   [employer] of Occupational Health - Occupational Stress Questionnaire    Feeling of Stress : Rather much  Social Connections: Moderately Isolated (10/05/2021)   Social Connection and Isolation Panel [NHANES]    Frequency of Communication with Friends and Family: More than three times a week    Frequency of Social Gatherings with Friends and Family: More than three times a week    Attends Religious Services: More than 4 times per year    Active Member of [employer] or Organizations: No    Attends Tax inspector Meetings: Never    Marital Status: Separated  Intimate Partner Violence: Not At Risk (10/05/2021)   Humiliation, Afraid, Rape, and Kick questionnaire    Fear of Current or Ex-Partner: No    Emotionally Abused: No    Physically Abused: No    Sexually Abused: No    Outpatient Medications Prior to Visit  Medication Sig Dispense Refill   albuterol (VENTOLIN HFA) 108 (90 Base) MCG/ACT inhaler Inhale 2 puffs into the lungs every 6 (six) hours as needed for wheezing or shortness of breath. 8 g 0   ALPRAZolam (XANAX) 1 MG tablet Take 0.5-1 mg by mouth daily as needed.     aspirin EC 81 MG tablet Take 1 tablet (81 mg total) by mouth daily. Swallow whole. 30 tablet 11   fluticasone (FLONASE) 50 MCG/ACT nasal spray SMARTSIG:1-2 Spray(s) Both Nares Daily PRN 16 g 1   hydrOXYzine (ATARAX) 50 MG tablet TAKE 1 TABLET BY MOUTH ONCE AT BEDTIME AS NEEDED FOR ANXIETY. 30 tablet 0   omeprazole (PRILOSEC) 40 MG capsule Take 1 capsule by mouth once daily 90 capsule 0   ARIPiprazole (ABILIFY) 10 MG tablet Take 1 tablet (10 mg total) by mouth every morning. Take 10 mg by mouth every morning. 30 tablet 0   atorvastatin (LIPITOR) 20 MG tablet Take 1 tablet (20 mg total) by mouth daily. 90 tablet 1   busPIRone (BUSPAR) 10 MG tablet Take 1 tablet (10 mg total) by mouth 3 (three) times daily. Take 10 mg by mouth 3 (three) times daily. 30 tablet 0   Chlorphen-PE-Acetaminophen (NOREL AD) 4-10-325 MG TABS Take 1 tablet by mouth 3 (three) times daily as needed (Nasal congestion). 30 tablet 0   methylPREDNISolone (MEDROL DOSEPAK) 4 MG TBPK tablet Take as package instructions. 1 each 0   olmesartan-hydrochlorothiazide (BENICAR HCT) 20-12.5 MG tablet Take 1 tablet by mouth daily. 90 tablet 1   propranolol (INDERAL) 20 MG tablet Take 1 tablet (20 mg total) by mouth 2 (two) times daily. 60 tablet 5   sildenafil (VIAGRA) 50 MG tablet Take 1 tablet (50 mg total) by mouth daily as needed for erectile dysfunction. 10  tablet 0   No facility-administered medications prior to visit.      ROS Review of Systems  Constitutional:  Negative for chills and fever.  HENT:  Positive for ear discharge. Negative for congestion and sore throat.   Eyes:  Negative for pain and discharge.  Respiratory:  Negative for cough and shortness of breath.   Cardiovascular:  Negative for chest pain and palpitations.  Gastrointestinal:  Negative for constipation, diarrhea, nausea and vomiting.  Endocrine: Negative for polydipsia and polyuria.  Genitourinary:  Negative for dysuria and  hematuria.  Musculoskeletal:  Negative for neck pain and neck stiffness.  Skin:  Negative for rash.  Neurological:  Negative for dizziness, weakness, numbness and headaches.  Psychiatric/Behavioral:  Negative for agitation, behavioral problems and sleep disturbance. The patient is not nervous/anxious.       Objective:    Physical Exam Vitals reviewed.  Constitutional:      General: He is not in acute distress.    Appearance: He is obese. He is not diaphoretic.  HENT:     Head: Normocephalic and atraumatic.     Left Ear: Tenderness present. A middle ear effusion is present.     Nose: Nose normal.     Mouth/Throat:     Mouth: Mucous membranes are moist.  Eyes:     General: No scleral icterus.    Extraocular Movements: Extraocular movements intact.  Cardiovascular:     Rate and Rhythm: Normal rate and regular rhythm.     Pulses: Normal pulses.     Heart sounds: Normal heart sounds. No murmur heard. Pulmonary:     Breath sounds: Normal breath sounds. No wheezing or rales.  Abdominal:     Palpations: Abdomen is soft.     Tenderness: There is no abdominal tenderness.  Musculoskeletal:     Cervical back: Neck supple. No tenderness.     Right lower leg: No edema.     Left lower leg: No edema.  Skin:    General: Skin is warm.     Findings: Bruising (left forearm) present. No rash.     Comments: Subcutaneous cyst over back of neck  -about 1 cm in diameter Skin tag in left armpit  Neurological:     General: No focal deficit present.     Mental Status: He is alert and oriented to person, place, and time.     Cranial Nerves: No cranial nerve deficit.     Sensory: No sensory deficit.     Motor: No weakness.  Psychiatric:        Mood and Affect: Mood normal.        Behavior: Behavior normal.        Cognition and Memory: Cognition normal.     BP 138/82 (BP Location: Left Arm)   Pulse 69   Ht 6' (1.829 m)   Wt 249 lb 6.4 oz (113.1 kg)   SpO2 95%   BMI 33.82 kg/m  Wt Readings from Last 3 Encounters:  05/13/22 249 lb 6.4 oz (113.1 kg)  12/15/21 271 lb 12.8 oz (123.3 kg)  04/09/21 (!) 324 lb 12.8 oz (147.3 kg)    Lab Results  Component Value Date   TSH 1.860 05/12/2022   Lab Results  Component Value Date   WBC 11.0 (H) 05/12/2022   HGB 15.5 05/12/2022   HCT 46.0 05/12/2022   MCV 92 05/12/2022   PLT 298 05/12/2022   Lab Results  Component Value Date   NA 137 05/12/2022   K 4.3 05/12/2022   CO2 23 05/12/2022   GLUCOSE 83 05/12/2022   BUN 12 05/12/2022   CREATININE 1.06 05/12/2022   BILITOT 0.5 05/12/2022   ALKPHOS 127 (H) 05/12/2022   AST 15 05/12/2022   ALT 13 05/12/2022   PROT 7.1 05/12/2022   ALBUMIN 4.3 05/12/2022   CALCIUM 9.4 05/12/2022   ANIONGAP 8 05/21/2020   EGFR 89 05/12/2022   Lab Results  Component Value Date   CHOL 185 05/12/2022   Lab Results  Component Value Date   HDL 36 (L)  05/12/2022   Lab Results  Component Value Date   LDLCALC 122 (H) 05/12/2022   Lab Results  Component Value Date   TRIG 150 (H) 05/12/2022   Lab Results  Component Value Date   CHOLHDL 5.1 (H) 05/12/2022   Lab Results  Component Value Date   HGBA1C 5.6 05/12/2022      Assessment & Plan:   Problem List Items Addressed This Visit       Cardiovascular and Mediastinum   Migraine   Relevant Medications   olmesartan-hydrochlorothiazide (BENICAR HCT) 20-12.5 MG tablet   propranolol  (INDERAL) 20 MG tablet   sildenafil (VIAGRA) 50 MG tablet   atorvastatin (LIPITOR) 20 MG tablet   Primary hypertension    BP Readings from Last 1 Encounters:  05/13/22 138/82  Well-controlled with Olmesartan-HCTZ 20-12.5 mg QD Counseled for compliance with the medications Advised DASH diet and moderate exercise/walking, at least 150 mins/week      Relevant Medications   olmesartan-hydrochlorothiazide (BENICAR HCT) 20-12.5 MG tablet   propranolol (INDERAL) 20 MG tablet   sildenafil (VIAGRA) 50 MG tablet   atorvastatin (LIPITOR) 20 MG tablet     Respiratory   Obstructive sleep apnea syndrome    STOP-BANG: 6 Very high risk for OSA Had ordered home sleep test, but he did not get it done due to cost concern Needs to continue to work on losing weight Side sleeping positions for now        Nervous and Auditory   Acute mucoid otitis media of left ear    Left-sided ear discharge and middle ear effusion Started ofloxacin otic drops If persistent, will refer to ENT specialist      Relevant Medications   ofloxacin (FLOXIN) 0.3 % OTIC solution     Other   Bipolar disorder (HCC)    Well-controlled Followed by [facility] Was on Abilify, BuSpar and Xanax as needed, needs to discuss with Psychiatry for maintenance treatment Continue Atarax as needed for anxiety/insomnia      Mixed hyperlipidemia    Was On Gemfibrozil, switched to statin Checked lipid profile      Relevant Medications   olmesartan-hydrochlorothiazide (BENICAR HCT) 20-12.5 MG tablet   propranolol (INDERAL) 20 MG tablet   sildenafil (VIAGRA) 50 MG tablet   atorvastatin (LIPITOR) 20 MG tablet   Tobacco abuse    Smokes 1 pack/day  Asked about quitting: confirms that he currently smokes cigarettes Advise to quit smoking: Educated about QUITTING to reduce the risk of cancer, cardio and cerebrovascular disease. Assess willingness: Unwilling to quit at this time, but is working on cutting  back. Assist with counseling and pharmacotherapy: Counseled for 5 minutes and literature provided. Arrange for follow up: follow up in 3 months and continue to offer help.  7 mins spent counseling.      Encounter for general adult medical examination with abnormal findings - Primary    Physical exam as documented. Fasting blood tests ordered today.      Erectile dysfunction    Could be due to psychiatric medications Has sildenafil as needed, but has not had optimal response with 50 mg dose Increased dose of sildenafil to 100 mg PRN      Relevant Medications   sildenafil (VIAGRA) 50 MG tablet   Laceration of left forearm    On 05/06/22 Tdap vaccine today      Relevant Orders   Tdap vaccine greater than or equal to 7yo IM (Completed)    Meds ordered this encounter  Medications  olmesartan-hydrochlorothiazide (BENICAR HCT) 20-12.5 MG tablet    Sig: Take 1 tablet by mouth daily.    Dispense:  90 tablet    Refill:  1   propranolol (INDERAL) 20 MG tablet    Sig: Take 1 tablet (20 mg total) by mouth 2 (two) times daily.    Dispense:  60 tablet    Refill:  5   sildenafil (VIAGRA) 50 MG tablet    Sig: Take 2 tablets (100 mg total) by mouth as needed for erectile dysfunction.    Dispense:  10 tablet    Refill:  2   atorvastatin (LIPITOR) 20 MG tablet    Sig: Take 1 tablet (20 mg total) by mouth daily.    Dispense:  90 tablet    Refill:  1   ofloxacin (FLOXIN) 0.3 % OTIC solution    Sig: Place 5 drops into the left ear daily.    Dispense:  5 mL    Refill:  0    Follow-up: Return in about 6 months (around 11/12/2022) for HTN and migraine.    Anabel Halon, MD

## 2022-05-13 NOTE — Assessment & Plan Note (Signed)
Left-sided ear discharge and middle ear effusion Started ofloxacin otic drops If persistent, will refer to ENT specialist

## 2022-05-13 NOTE — Assessment & Plan Note (Addendum)
Was On Gemfibrozil, switched to statin Checked lipid profile

## 2022-05-13 NOTE — Patient Instructions (Signed)
Please start applying Ofloxacin ear drop as prescribed.  Please continue taking Olmesartan-HCTZ and Propranolol as prescribed.  Please continue to follow low carb diet and perform moderate exercise/walking at least 150 mins/week.  Please continue your efforts to cut down -> quit smoking.

## 2022-05-13 NOTE — Assessment & Plan Note (Signed)
On 05/06/22 Tdap vaccine today

## 2022-05-13 NOTE — Assessment & Plan Note (Addendum)
Physical exam as documented. ?Fasting blood tests ordered today. ?

## 2022-05-13 NOTE — Assessment & Plan Note (Signed)
Well-controlled Followed by [facility] Was on Abilify, BuSpar and Xanax as needed, needs to discuss with Psychiatry for maintenance treatment Continue Atarax as needed for anxiety/insomnia

## 2022-05-13 NOTE — Assessment & Plan Note (Signed)
BP Readings from Last 1 Encounters:  05/13/22 138/82   Well-controlled with Olmesartan-HCTZ 20-12.5 mg QD Counseled for compliance with the medications Advised DASH diet and moderate exercise/walking, at least 150 mins/week

## 2022-05-13 NOTE — Assessment & Plan Note (Signed)
STOP-BANG: 6 Very high risk for OSA Had ordered home sleep test, but he did not get it done due to cost concern Needs to continue to work on losing weight Side sleeping positions for now 

## 2022-05-14 LAB — CBC WITH DIFFERENTIAL/PLATELET
Basophils Absolute: 0.1 10*3/uL (ref 0.0–0.2)
Basos: 1 %
EOS (ABSOLUTE): 0.3 10*3/uL (ref 0.0–0.4)
Eos: 2 %
Hematocrit: 46 % (ref 37.5–51.0)
Hemoglobin: 15.5 g/dL (ref 13.0–17.7)
Immature Grans (Abs): 0 10*3/uL (ref 0.0–0.1)
Immature Granulocytes: 0 %
Lymphocytes Absolute: 3.1 10*3/uL (ref 0.7–3.1)
Lymphs: 28 %
MCH: 31 pg (ref 26.6–33.0)
MCHC: 33.7 g/dL (ref 31.5–35.7)
MCV: 92 fL (ref 79–97)
Monocytes Absolute: 0.5 10*3/uL (ref 0.1–0.9)
Monocytes: 5 %
Neutrophils Absolute: 7 10*3/uL (ref 1.4–7.0)
Neutrophils: 64 %
Platelets: 298 10*3/uL (ref 150–450)
RBC: 5 x10E6/uL (ref 4.14–5.80)
RDW: 12.3 % (ref 11.6–15.4)
WBC: 11 10*3/uL — ABNORMAL HIGH (ref 3.4–10.8)

## 2022-05-14 LAB — CMP14+EGFR
ALT: 13 IU/L (ref 0–44)
AST: 15 IU/L (ref 0–40)
Albumin/Globulin Ratio: 1.5 (ref 1.2–2.2)
Albumin: 4.3 g/dL (ref 4.1–5.1)
Alkaline Phosphatase: 127 IU/L — ABNORMAL HIGH (ref 44–121)
BUN/Creatinine Ratio: 11 (ref 9–20)
BUN: 12 mg/dL (ref 6–24)
Bilirubin Total: 0.5 mg/dL (ref 0.0–1.2)
CO2: 23 mmol/L (ref 20–29)
Calcium: 9.4 mg/dL (ref 8.7–10.2)
Chloride: 100 mmol/L (ref 96–106)
Creatinine, Ser: 1.06 mg/dL (ref 0.76–1.27)
Globulin, Total: 2.8 g/dL (ref 1.5–4.5)
Glucose: 83 mg/dL (ref 70–99)
Potassium: 4.3 mmol/L (ref 3.5–5.2)
Sodium: 137 mmol/L (ref 134–144)
Total Protein: 7.1 g/dL (ref 6.0–8.5)
eGFR: 89 mL/min/{1.73_m2} (ref >59)

## 2022-05-14 LAB — HEMOGLOBIN A1C
Est. average glucose Bld gHb Est-mCnc: 114 mg/dL
Hgb A1c MFr Bld: 5.6 % (ref 4.8–5.6)

## 2022-05-14 LAB — LIPID PANEL
Chol/HDL Ratio: 5.1 ratio — ABNORMAL HIGH (ref 0.0–5.0)
Cholesterol, Total: 185 mg/dL (ref 100–199)
HDL: 36 mg/dL — ABNORMAL LOW (ref >39)
LDL Chol Calc (NIH): 122 mg/dL — ABNORMAL HIGH (ref 0–99)
Triglycerides: 150 mg/dL — ABNORMAL HIGH (ref 0–149)
VLDL Cholesterol Cal: 27 mg/dL (ref 5–40)

## 2022-05-14 LAB — VITAMIN D 25 HYDROXY (VIT D DEFICIENCY, FRACTURES): Vit D, 25-Hydroxy: 27.3 ng/mL — ABNORMAL LOW (ref 30.0–100.0)

## 2022-05-14 LAB — TSH: TSH: 1.86 u[IU]/mL (ref 0.450–4.500)

## 2022-08-10 ENCOUNTER — Other Ambulatory Visit: Payer: Self-pay | Admitting: Internal Medicine

## 2022-08-10 DIAGNOSIS — K219 Gastro-esophageal reflux disease without esophagitis: Secondary | ICD-10-CM

## 2022-10-05 ENCOUNTER — Other Ambulatory Visit: Payer: Self-pay | Admitting: Internal Medicine

## 2022-10-05 ENCOUNTER — Telehealth: Payer: Self-pay | Admitting: Internal Medicine

## 2022-10-05 ENCOUNTER — Other Ambulatory Visit: Payer: Self-pay

## 2022-10-05 DIAGNOSIS — E782 Mixed hyperlipidemia: Secondary | ICD-10-CM

## 2022-10-05 DIAGNOSIS — K219 Gastro-esophageal reflux disease without esophagitis: Secondary | ICD-10-CM

## 2022-10-05 DIAGNOSIS — G43809 Other migraine, not intractable, without status migrainosus: Secondary | ICD-10-CM

## 2022-10-05 MED ORDER — PROPRANOLOL HCL 20 MG PO TABS
20.0000 mg | ORAL_TABLET | Freq: Two times a day (BID) | ORAL | 5 refills | Status: DC
Start: 2022-10-05 — End: 2023-05-24

## 2022-10-05 MED ORDER — ATORVASTATIN CALCIUM 20 MG PO TABS
20.0000 mg | ORAL_TABLET | Freq: Every day | ORAL | 1 refills | Status: DC
Start: 2022-10-05 — End: 2023-03-11

## 2022-10-05 MED ORDER — OMEPRAZOLE 40 MG PO CPDR
40.0000 mg | DELAYED_RELEASE_CAPSULE | Freq: Every day | ORAL | 0 refills | Status: DC
Start: 2022-10-05 — End: 2023-01-21

## 2022-10-05 NOTE — Telephone Encounter (Signed)
Prescription Request  10/05/2022  LOV: 05/13/2022  What is the name of the medication or equipment? omeprazole (PRILOSEC) 40 MG capsule [[id number]   propranolol (INDERAL) 20 MG tablet [[id number]]    ALPRAZolam (XANAX) 1 MG tablet [[id number]]   Have you contacted your pharmacy to request a refill? No   Which pharmacy would you like this sent to?  [facility] [address] [place] - [address] [place] [address] Phone: [phone] Fax: [phone]    Patient notified that their request is being sent to the clinical staff for review and that they should receive a response within 2 business days.   Please advise at Mobile [phone] (mobile)

## 2022-10-05 NOTE — Telephone Encounter (Signed)
Patient advised.

## 2022-10-25 ENCOUNTER — Ambulatory Visit (INDEPENDENT_AMBULATORY_CARE_PROVIDER_SITE_OTHER): Payer: Medicare HMO

## 2022-10-25 VITALS — Ht 72.0 in | Wt 249.0 lb

## 2022-10-25 DIAGNOSIS — Z0000 Encounter for general adult medical examination without abnormal findings: Secondary | ICD-10-CM | POA: Diagnosis not present

## 2022-10-25 NOTE — Progress Notes (Signed)
Subjective:   Mark Goodman is a 42 y.o. male who presents for Medicare Annual/Subsequent preventive examination.  Visit Complete: Virtual  I connected with  Evern Bio on 10/25/22 by a audio enabled telemedicine application and verified that I am speaking with the correct person using two identifiers.  Patient Location: Home  Provider Location: Home Office  I discussed the limitations of evaluation and management by telemedicine. The patient expressed understanding and agreed to proceed.  Patient Medicare AWV questionnaire was completed by the patient on 10/25/2022; I have confirmed that all information answered by patient is correct and no changes since this date.  Cardiac Risk Factors include: noneBecause this visit was a virtual/telehealth visit, some criteria may be missing or patient reported. Any vitals not documented were not able to be obtained and vitals that have been documented are patient reported.       Objective:    Today's Vitals   10/25/22 1538  Weight: 249 lb (112.9 kg)  Height: 6' (1.829 m)   Body mass index is 33.77 kg/m.     10/25/2022    3:41 PM 10/05/2021    2:06 PM 10/01/2020    3:16 PM 07/13/2020    6:29 AM 05/21/2020    5:43 PM 02/26/2018    6:49 PM 08/22/2017    9:39 AM  Advanced Directives  Does Patient Have a Medical Advance Directive? No No No No No No No  Would patient like information on creating a medical advance directive? Yes (MAU/Ambulatory/Procedural Areas - Information given) No - Patient declined No - Patient declined No - Patient declined   No - Patient declined    Current Medications (verified) Outpatient Encounter Medications as of 10/25/2022  Medication Sig   albuterol (VENTOLIN HFA) 108 (90 Base) MCG/ACT inhaler Inhale 2 puffs into the lungs every 6 (six) hours as needed for wheezing or shortness of breath.   ALPRAZolam (XANAX) 1 MG tablet Take 0.5-1 mg by mouth daily as needed.   aspirin EC 81 MG tablet Take 1 tablet (81  mg total) by mouth daily. Swallow whole.   atorvastatin (LIPITOR) 20 MG tablet Take 1 tablet (20 mg total) by mouth daily.   fluticasone (FLONASE) 50 MCG/ACT nasal spray SMARTSIG:1-2 Spray(s) Both Nares Daily PRN   hydrOXYzine (ATARAX) 50 MG tablet TAKE 1 TABLET BY MOUTH ONCE AT BEDTIME AS NEEDED FOR ANXIETY.   olmesartan-hydrochlorothiazide (BENICAR HCT) 20-12.5 MG tablet Take 1 tablet by mouth daily.   omeprazole (PRILOSEC) 40 MG capsule Take 1 capsule (40 mg total) by mouth daily.   propranolol (INDERAL) 20 MG tablet Take 1 tablet (20 mg total) by mouth 2 (two) times daily.   sildenafil (VIAGRA) 50 MG tablet Take 2 tablets (100 mg total) by mouth as needed for erectile dysfunction.   No facility-administered encounter medications on file as of 10/25/2022.    Allergies (verified) Tomato and Penicillins   History: Past Medical History:  Diagnosis Date   ADHD (attention deficit hyperactivity disorder)    Bipolar 1 disorder (HCC)    Cleft uvula    Hypertension    PTSD (post-traumatic stress disorder)    Past Surgical History:  Procedure Laterality Date   EXTERNAL EAR SURGERY     History reviewed. No pertinent family history. Social History   Socioeconomic History   Marital status: Married    Spouse name: Not on file   Number of children: 2   Years of education: Not on file   Highest education level:  Not on file  Occupational History   Not on file  Tobacco Use   Smoking status: Every Day    Current packs/day: 1.00    Average packs/day: 1 pack/day for 20.0 years (20.0 ttl pk-yrs)    Types: Cigarettes   Smokeless tobacco: Never  Vaping Use   Vaping status: Never Used  Substance and Sexual Activity   Alcohol use: No   Drug use: No   Sexual activity: Not on file  Other Topics Concern   Not on file  Social History Narrative   Currently on disability.   2 children   Social Determinants of Health   Financial Resource Strain: Low Risk  (10/25/2022)   Overall Financial  Resource Strain (CARDIA)    Difficulty of Paying Living Expenses: Not hard at all  Food Insecurity: No Food Insecurity (10/25/2022)   Hunger Vital Sign    Worried About Running Out of Food in the Last Year: Never true    Ran Out of Food in the Last Year: Never true  Transportation Needs: No Transportation Needs (10/25/2022)   PRAPARE - Administrator, Civil Service (Medical): No    Lack of Transportation (Non-Medical): No  Physical Activity: Unknown (10/25/2022)   Exercise Vital Sign    Days of Exercise per Week: 3 days    Minutes of Exercise per Session: Not on file  Stress: No Stress Concern Present (10/25/2022)   [employer] of Occupational Health - Occupational Stress Questionnaire    Feeling of Stress : Not at all  Social Connections: Moderately Integrated (10/25/2022)   Social Connection and Isolation Panel [NHANES]    Frequency of Communication with Friends and Family: More than three times a week    Frequency of Social Gatherings with Friends and Family: More than three times a week    Attends Religious Services: More than 4 times per year    Active Member of [employer] or Organizations: No    Attends Engineer, structural: Never    Marital Status: Married    Tobacco Counseling Ready to quit: No Counseling given: Not Answered   Clinical Intake:  Pre-visit preparation completed: Yes  Pain : No/denies pain     Nutritional Risks: None Diabetes: No  How often do you need to have someone help you when you read instructions, pamphlets, or other written materials from your doctor or pharmacy?: 1 - Never  Interpreter Needed?: No  Information entered by :: Renie Ora, LPN   Activities of Daily Living    10/25/2022    3:41 PM  In your present state of health, do you have any difficulty performing the following activities:  Hearing? 0  Vision? 0  Difficulty concentrating or making decisions? 0  Walking or climbing stairs? 0  Dressing or bathing?  0  Doing errands, shopping? 0  Preparing Food and eating ? N  Using the Toilet? N  In the past six months, have you accidently leaked urine? N  Do you have problems with loss of bowel control? N  Managing your Medications? N  Managing your Finances? N  Housekeeping or managing your Housekeeping? N    Patient Care Team: Anabel Halon, MD as PCP - General (Internal Medicine)  Indicate any recent Medical Services you may have received from other than [facility] providers in the past year (date may be approximate).     Assessment:   This is a routine wellness examination for Tailor.  Hearing/Vision screen Vision Screening - Comments:: Wears rx  glasses - up to date with routine eye exams with  Dr.Cotter    Goals Addressed             This Visit's Progress    Exercise 3x per week (30 min per time)         Depression Screen    10/25/2022    3:40 PM 05/13/2022    2:49 PM 12/15/2021    1:53 PM 11/09/2021    1:37 PM 10/05/2021    2:06 PM 10/05/2021    2:04 PM 04/27/2021    2:24 PM  PHQ 2/9 Scores  PHQ - 2 Score 0 0 2 0 2 2 0  PHQ- 9 Score   11 0 4 4     Fall Risk    10/25/2022    3:39 PM 05/13/2022    2:49 PM 12/15/2021    1:52 PM 11/09/2021    1:37 PM 10/05/2021    2:06 PM  Fall Risk   Falls in the past year? 0 0 0 0 0  Number falls in past yr: 0 0 0 0 0  Injury with Fall? 0 0 0 0 0  Risk for fall due to : No Fall Risks  No Fall Risks No Fall Risks No Fall Risks  Follow up Falls prevention discussed  Falls evaluation completed Falls evaluation completed Falls evaluation completed    MEDICARE RISK AT HOME: Medicare Risk at Home Any stairs in or around the home?: No If so, are there any without handrails?: No Home free of loose throw rugs in walkways, pet beds, electrical cords, etc?: Yes Adequate lighting in your home to reduce risk of falls?: Yes Life alert?: No Use of a cane, walker or w/c?: No Grab bars in the bathroom?: No Shower chair or bench in shower?:  No Elevated toilet seat or a handicapped toilet?: No  TIMED UP AND GO:  Was the test performed?  No    Cognitive Function:        10/25/2022    3:42 PM 10/05/2021    2:10 PM 10/01/2020    3:19 PM  6CIT Screen  What Year? 0 points 0 points 0 points  What month? 0 points 0 points 0 points  What time? 0 points 0 points 0 points  Count back from 20 0 points 0 points 0 points  Months in reverse 0 points 0 points 4 points  Repeat phrase 0 points 0 points 0 points  Total Score 0 points 0 points 4 points    Immunizations Immunization History  Administered Date(s) Administered   Tdap 05/13/2022    TDAP status: Up to date  Flu Vaccine status: Due, Education has been provided regarding the importance of this vaccine. Advised may receive this vaccine at local pharmacy or Health Dept. Aware to provide a copy of the vaccination record if obtained from local pharmacy or Health Dept. Verbalized acceptance and understanding.  Pneumococcal vaccine status: Due, Education has been provided regarding the importance of this vaccine. Advised may receive this vaccine at local pharmacy or Health Dept. Aware to provide a copy of the vaccination record if obtained from local pharmacy or Health Dept. Verbalized acceptance and understanding.  Covid-19 vaccine status: Completed vaccines  Qualifies for Shingles Vaccine? No   Zostavax completed No   Shingrix Completed?: No.    Education has been provided regarding the importance of this vaccine. Patient has been advised to call insurance company to determine out of pocket expense if they have not yet  received this vaccine. Advised may also receive vaccine at local pharmacy or Health Dept. Verbalized acceptance and understanding.  Screening Tests Health Maintenance  Topic Date Due   INFLUENZA VACCINE  Never done   COVID-19 Vaccine (1 - 2023-24 season) Never done   Medicare Annual Wellness (AWV)  10/25/2023   DTaP/Tdap/Td (2 - Td or Tdap) 05/12/2032    Hepatitis C Screening  Completed   HIV Screening  Completed   HPV VACCINES  Aged Out    Health Maintenance  Health Maintenance Due  Topic Date Due   INFLUENZA VACCINE  Never done   COVID-19 Vaccine (1 - 2023-24 season) Never done    Colorectal cancer screening: No longer required.   Lung Cancer Screening: (Low Dose CT Chest recommended if Age 26-80 years, 20 pack-year currently smoking OR have quit w/in 15years.) does qualify.   Lung Cancer Screening Referral: declined by patient   Additional Screening:  Hepatitis C Screening: does not qualify  Vision Screening: Recommended annual ophthalmology exams for early detection of glaucoma and other disorders of the eye. Is the patient up to date with their annual eye exam?  Yes  Who is the provider or what is the name of the office in which the patient attends annual eye exams? Dr.Cotter  If pt is not established with a provider, would they like to be referred to a provider to establish care? No .   Dental Screening: Recommended annual dental exams for proper oral hygiene   Community Resource Referral / Chronic Care Management: CRR required this visit?  No   CCM required this visit?  No     Plan:     I have personally reviewed and noted the following in the patient's chart:   Medical and social history Use of alcohol, tobacco or illicit drugs  Current medications and supplements including opioid prescriptions. Patient is not currently taking opioid prescriptions. Functional ability and status Nutritional status Physical activity Advanced directives List of other physicians Hospitalizations, surgeries, and ER visits in previous 12 months Vitals Screenings to include cognitive, depression, and falls Referrals and appointments  In addition, I have reviewed and discussed with patient certain preventive protocols, quality metrics, and best practice recommendations. A written personalized care plan for preventive services  as well as general preventive health recommendations were provided to patient.     Lorrene Reid, LPN   [id number]   After Visit Summary: (MyChart) Due to this being a telephonic visit, the after visit summary with patients personalized plan was offered to patient via MyChart   Nurse Notes: none

## 2022-10-25 NOTE — Patient Instructions (Signed)
[name] , Thank you for taking time to come for your Medicare Wellness Visit. I appreciate your ongoing commitment to your health goals. Please review the following plan we discussed and let me know if I can assist you in the future.   Referrals/Orders/Follow-Ups/Clinician Recommendations: Aim for 30 minutes of exercise or brisk walking, 6-8 glasses of water, and 5 servings of fruits and vegetables each day.   This is a list of the screening recommended for you and due dates:  Health Maintenance  Topic Date Due   Flu Shot  Never done   COVID-19 Vaccine (1 - 2023-24 season) Never done   Medicare Annual Wellness Visit  10/25/2023   DTaP/Tdap/Td vaccine (2 - Td or Tdap) 05/12/2032   Hepatitis C Screening  Completed   HIV Screening  Completed   HPV Vaccine  Aged Out    Advanced directives: (Provided) Advance directive discussed with you today. I have provided a copy for you to complete at home and have notarized. Once this is complete, please bring a copy in to our office so we can scan it into your chart. Information on Advanced Care Planning can be found at [facility] [place] Advance Health Care Directives Advance Health Care Directives ([email or web address]    Next Medicare Annual Wellness Visit scheduled for next year: Yes Insert preventive care Attachment reference

## 2022-11-05 ENCOUNTER — Other Ambulatory Visit: Payer: Self-pay | Admitting: Internal Medicine

## 2022-11-05 DIAGNOSIS — I10 Essential (primary) hypertension: Secondary | ICD-10-CM

## 2022-11-15 ENCOUNTER — Ambulatory Visit: Payer: Medicare HMO | Admitting: Internal Medicine

## 2022-11-23 ENCOUNTER — Encounter: Payer: Self-pay | Admitting: Internal Medicine

## 2022-11-23 ENCOUNTER — Ambulatory Visit (INDEPENDENT_AMBULATORY_CARE_PROVIDER_SITE_OTHER): Payer: Medicare HMO | Admitting: Internal Medicine

## 2022-11-23 VITALS — BP 106/65 | HR 88 | Ht 73.0 in | Wt 256.2 lb

## 2022-11-23 DIAGNOSIS — I10 Essential (primary) hypertension: Secondary | ICD-10-CM | POA: Diagnosis not present

## 2022-11-23 DIAGNOSIS — F319 Bipolar disorder, unspecified: Secondary | ICD-10-CM

## 2022-11-23 DIAGNOSIS — E782 Mixed hyperlipidemia: Secondary | ICD-10-CM | POA: Diagnosis not present

## 2022-11-23 DIAGNOSIS — G43809 Other migraine, not intractable, without status migrainosus: Secondary | ICD-10-CM

## 2022-11-23 DIAGNOSIS — G8929 Other chronic pain: Secondary | ICD-10-CM | POA: Insufficient documentation

## 2022-11-23 DIAGNOSIS — Z720 Tobacco use: Secondary | ICD-10-CM

## 2022-11-23 DIAGNOSIS — M25562 Pain in left knee: Secondary | ICD-10-CM

## 2022-11-23 DIAGNOSIS — N529 Male erectile dysfunction, unspecified: Secondary | ICD-10-CM

## 2022-11-23 DIAGNOSIS — H65191 Other acute nonsuppurative otitis media, right ear: Secondary | ICD-10-CM

## 2022-11-23 MED ORDER — TADALAFIL 20 MG PO TABS: 20.0000 mg | ORAL_TABLET | ORAL | 3 refills | Status: DC | PRN

## 2022-11-23 MED ORDER — OFLOXACIN 0.3 % OT SOLN: 5.0000 [drp] | Freq: Every day | OTIC | 0 refills | Status: DC

## 2022-11-23 MED ORDER — TADALAFIL 20 MG PO TABS
20.0000 mg | ORAL_TABLET | ORAL | 1 refills | Status: AC | PRN
Start: 2022-11-23 — End: ?

## 2022-11-23 MED ORDER — CELECOXIB 200 MG PO CAPS: 200.0000 mg | ORAL_CAPSULE | Freq: Every day | ORAL | 0 refills | Status: DC | PRN

## 2022-11-23 NOTE — Progress Notes (Signed)
Established Patient Office Visit  Subjective:  Patient ID: Mark Goodman, male    DOB: May 26, 1980  Age: 42 y.o. MRN: [id number]  CC:  Chief Complaint  Patient presents with   Hypertension    Six month follow up   Migraine    Six month follow up   Otalgia    Right ear possibly infected    HPI Mark Goodman is a 42 y.o. male with past medical history of HTN, HLD, Bipolar disorder, anxiety, GERD and tobacco abuse who presents for f/u of his chronic medical conditions.  HTN: BP is well-controlled now. Takes medications regularly. Patient denies headache, dizziness, chest pain, dyspnea or palpitations.   He [facility] to [facility] for [facility] disorder and anxiety, but needs to schedule follow-up.  He was on Abilify, as needed Xanax and BuSpar currently, but has stopped taking them. He takes Xanax occasionally.  He feels better now since meeting her current fiance.  Denies any anhedonia, SI or HI.  He has not been able to get sleep study yet due to cost concern. He has lost about 22 lbs with alcohol cessation and diet modification.  He has cut down smoking to 0.5 pack/day from 1 pack/day.  He complains of right ear discharge, whitish, had blood-mixed discharge once also, for the last 2 weeks.  He also reports mild right ear pain.  Denies any fever or chills.  Denies nasal congestion, postnasal drip or sore throat currently.  He reports chronic left knee pain, worse since 11/08/22.  Denies any recent injury.  He has noticed left knee swelling at times.  He has tried taking Tylenol without much relief.  Denies any numbness or tingling of LE.  He is currently taking sildenafil 100 mg as needed for erectile dysfunction.  He still has difficulty maintaining erection.  Denies any dysuria, hematuria, urethral discharge or urinary hesitancy or resistance.   Past Medical History:  Diagnosis Date   ADHD (attention deficit hyperactivity disorder)    Bipolar  1 disorder (HCC)    Cleft uvula    Hypertension    PTSD (post-traumatic stress disorder)     Past Surgical History:  Procedure Laterality Date   EXTERNAL EAR SURGERY      History reviewed. No pertinent family history.  Social History   Socioeconomic History   Marital status: Married    Spouse name: Not on file   Number of children: 2   Years of education: Not on file   Highest education level: Not on file  Occupational History   Not on file  Tobacco Use   Smoking status: Every Day    Current packs/day: 1.00    Average packs/day: 1 pack/day for 20.0 years (20.0 ttl pk-yrs)    Types: Cigarettes   Smokeless tobacco: Never  Vaping Use   Vaping status: Never Used  Substance and Sexual Activity   Alcohol use: No   Drug use: No   Sexual activity: Not on file  Other Topics Concern   Not on file  Social History Narrative   Currently on disability.   2 children   Social Determinants of Health   Financial Resource Strain: Low Risk  (10/25/2022)   Overall Financial Resource Strain (CARDIA)    Difficulty of Paying Living Expenses: Not hard at all  Food Insecurity: No Food Insecurity (10/25/2022)   Hunger Vital Sign    Worried About Running Out of Food in the Last Year: Never true  Ran Out of Food in the Last Year: Never true  Transportation Needs: No Transportation Needs (10/25/2022)   PRAPARE - Administrator, Civil Service (Medical): No    Lack of Transportation (Non-Medical): No  Physical Activity: Unknown (10/25/2022)   Exercise Vital Sign    Days of Exercise per Week: 3 days    Minutes of Exercise per Session: Not on file  Stress: No Stress Concern Present (10/25/2022)   [employer] of Occupational Health - Occupational Stress Questionnaire    Feeling of Stress : Not at all  Social Connections: Moderately Integrated (10/25/2022)   Social Connection and Isolation Panel [NHANES]    Frequency of Communication with Friends and Family: More than three  times a week    Frequency of Social Gatherings with Friends and Family: More than three times a week    Attends Religious Services: More than 4 times per year    Active Member of [employer] or Organizations: No    Attends Banker Meetings: Never    Marital Status: Married  Catering manager Violence: Not At Risk (10/25/2022)   Humiliation, Afraid, Rape, and Kick questionnaire    Fear of Current or Ex-Partner: No    Emotionally Abused: No    Physically Abused: No    Sexually Abused: No    Outpatient Medications Prior to Visit  Medication Sig Dispense Refill   albuterol (VENTOLIN HFA) 108 (90 Base) MCG/ACT inhaler Inhale 2 puffs into the lungs every 6 (six) hours as needed for wheezing or shortness of breath. 8 g 0   ALPRAZolam (XANAX) 1 MG tablet Take 0.5-1 mg by mouth daily as needed.     aspirin EC 81 MG tablet Take 1 tablet (81 mg total) by mouth daily. Swallow whole. 30 tablet 11   atorvastatin (LIPITOR) 20 MG tablet Take 1 tablet (20 mg total) by mouth daily. 90 tablet 1   fluticasone (FLONASE) 50 MCG/ACT nasal spray SMARTSIG:1-2 Spray(s) Both Nares Daily PRN 16 g 1   hydrOXYzine (ATARAX) 50 MG tablet TAKE 1 TABLET BY MOUTH ONCE AT BEDTIME AS NEEDED FOR ANXIETY. 30 tablet 0   olmesartan-hydrochlorothiazide (BENICAR HCT) 20-12.5 MG tablet Take 1 tablet by mouth once daily 90 tablet 0   omeprazole (PRILOSEC) 40 MG capsule Take 1 capsule (40 mg total) by mouth daily. 90 capsule 0   propranolol (INDERAL) 20 MG tablet Take 1 tablet (20 mg total) by mouth 2 (two) times daily. 60 tablet 5   sildenafil (VIAGRA) 50 MG tablet Take 2 tablets (100 mg total) by mouth as needed for erectile dysfunction. 10 tablet 2   No facility-administered medications prior to visit.      ROS Review of Systems  Constitutional:  Negative for chills and fever.  HENT:  Positive for ear discharge and ear pain. Negative for congestion and sore throat.   Eyes:  Negative for pain and discharge.   Respiratory:  Negative for cough and shortness of breath.   Cardiovascular:  Negative for chest pain and palpitations.  Gastrointestinal:  Negative for diarrhea, nausea and vomiting.  Endocrine: Negative for polydipsia and polyuria.  Genitourinary:  Negative for dysuria and hematuria.  Musculoskeletal:  Positive for arthralgias (Left knee). Negative for neck pain and neck stiffness.  Skin:  Negative for rash.  Neurological:  Negative for dizziness, weakness, numbness and headaches.  Psychiatric/Behavioral:  Negative for agitation, behavioral problems and sleep disturbance. The patient is not nervous/anxious.       Objective:    Physical  Exam Vitals reviewed.  Constitutional:      General: He is not in acute distress.    Appearance: He is obese. He is not diaphoretic.  HENT:     Head: Normocephalic and atraumatic.     Right Ear: Tenderness present. A middle ear effusion is present.     Nose: Nose normal.     Mouth/Throat:     Mouth: Mucous membranes are moist.  Eyes:     General: No scleral icterus.    Extraocular Movements: Extraocular movements intact.  Cardiovascular:     Rate and Rhythm: Normal rate and regular rhythm.     Pulses: Normal pulses.     Heart sounds: Normal heart sounds. No murmur heard. Pulmonary:     Breath sounds: Normal breath sounds. No wheezing or rales.  Abdominal:     Palpations: Abdomen is soft.     Tenderness: There is no abdominal tenderness.  Musculoskeletal:     Cervical back: Neck supple. No tenderness.     Right lower leg: No edema.     Left lower leg: No edema.  Skin:    General: Skin is warm.     Findings: Bruising present. No rash.     Comments: Subcutaneous cyst over back of neck -about 1 cm in diameter Skin tag in left armpit  Neurological:     General: No focal deficit present.     Mental Status: He is alert and oriented to person, place, and time.     Sensory: No sensory deficit.     Motor: No weakness.  Psychiatric:         Mood and Affect: Mood normal.        Behavior: Behavior normal.        Cognition and Memory: Cognition normal.     BP 106/65 (BP Location: Left Arm, Patient Position: Sitting, Cuff Size: Large)   Pulse 88   Ht 6\' 1"  (1.854 m)   Wt 256 lb 3.2 oz (116.2 kg)   SpO2 96%   BMI 33.80 kg/m  Wt Readings from Last 3 Encounters:  11/23/22 256 lb 3.2 oz (116.2 kg)  10/25/22 249 lb (112.9 kg)  05/13/22 249 lb 6.4 oz (113.1 kg)    Lab Results  Component Value Date   TSH 1.860 05/12/2022   Lab Results  Component Value Date   WBC 11.0 (H) 05/12/2022   HGB 15.5 05/12/2022   HCT 46.0 05/12/2022   MCV 92 05/12/2022   PLT 298 05/12/2022   Lab Results  Component Value Date   NA 137 05/12/2022   K 4.3 05/12/2022   CO2 23 05/12/2022   GLUCOSE 83 05/12/2022   BUN 12 05/12/2022   CREATININE 1.06 05/12/2022   BILITOT 0.5 05/12/2022   ALKPHOS 127 (H) 05/12/2022   AST 15 05/12/2022   ALT 13 05/12/2022   PROT 7.1 05/12/2022   ALBUMIN 4.3 05/12/2022   CALCIUM 9.4 05/12/2022   ANIONGAP 8 05/21/2020   EGFR 89 05/12/2022   Lab Results  Component Value Date   CHOL 185 05/12/2022   Lab Results  Component Value Date   HDL 36 (L) 05/12/2022   Lab Results  Component Value Date   LDLCALC 122 (H) 05/12/2022   Lab Results  Component Value Date   TRIG 150 (H) 05/12/2022   Lab Results  Component Value Date   CHOLHDL 5.1 (H) 05/12/2022   Lab Results  Component Value Date   HGBA1C 5.6 05/12/2022      Assessment &  Plan:   Problem List Items Addressed This Visit       Cardiovascular and Mediastinum   Migraine - Primary    Overall well-controlled On Propranolol for ppx Tylenol PRN      Relevant Medications   celecoxib (CELEBREX) 200 MG capsule   tadalafil (CIALIS) 20 MG tablet   Primary hypertension    BP Readings from Last 1 Encounters:  11/23/22 106/65   Well-controlled with Olmesartan-HCTZ 20-12.5 mg QD Counseled for compliance with the medications Advised DASH  diet and moderate exercise/walking, at least 150 mins/week      Relevant Medications   tadalafil (CIALIS) 20 MG tablet     Nervous and Auditory   Acute mucoid otitis media of right ear    Right sided ear discharge and middle ear effusion Started ofloxacin otic drops If persistent, will refer to ENT specialist      Relevant Medications   ofloxacin (FLOXIN) 0.3 % OTIC solution     Other   Bipolar disorder (HCC)    Well-controlled Used to be followed by [facility] Was on Abilify, BuSpar and Xanax as needed, needs to discuss with Psychiatry for maintenance treatment Continue Atarax as needed for anxiety/insomnia      Mixed hyperlipidemia    Was On Gemfibrozil, now on Atorvastatin Checked lipid profile      Relevant Medications   tadalafil (CIALIS) 20 MG tablet   Tobacco abuse    Smokes 0.5 pack/day, has cut down from 1 pack/day  Asked about quitting: confirms that he currently smokes cigarettes Advise to quit smoking: Educated about QUITTING to reduce the risk of cancer, cardio and cerebrovascular disease. Assess willingness: Unwilling to quit at this time, but is working on cutting back. Assist with counseling and pharmacotherapy: Counseled for 5 minutes and literature provided. Arrange for follow up: follow up in 3 months and continue to offer help.      Erectile dysfunction    Has sildenafil as needed, but has not had optimal response with 100 mg dose Switched to Cialis 20 mg as needed      Relevant Medications   tadalafil (CIALIS) 20 MG tablet   Morbid obesity (HCC)    BMI Readings from Last 3 Encounters:  11/23/22 33.80 kg/m  10/25/22 33.77 kg/m  05/13/22 33.82 kg/m   Associated with HTN, GERD, bipolar depression and HLD Has lost about 54 lbs with alcohol cessation and diet modification, now stable Moderate exercise advised      Chronic pain of left knee    Reports remote injuries Celebrex as needed for pain If persistent, will get  x-ray of knee and/or orthopedic surgery evaluation      Relevant Medications   celecoxib (CELEBREX) 200 MG capsule     Meds ordered this encounter  Medications   ofloxacin (FLOXIN) 0.3 % OTIC solution    Sig: Place 5 drops into the right ear daily.    Dispense:  5 mL    Refill:  0   DISCONTD: tadalafil (CIALIS) 20 MG tablet    Sig: Take 1 tablet (20 mg total) by mouth as needed for erectile dysfunction.    Dispense:  10 tablet    Refill:  3   celecoxib (CELEBREX) 200 MG capsule    Sig: Take 1 capsule (200 mg total) by mouth daily as needed for mild pain (pain score 1-3) or moderate pain (pain score 4-6).    Dispense:  30 capsule    Refill:  0   tadalafil (CIALIS) 20  MG tablet    Sig: Take 1 tablet (20 mg total) by mouth as needed for erectile dysfunction.    Dispense:  30 tablet    Refill:  1    Follow-up: Return in about 6 months (around 05/23/2023) for Annual physical.    Anabel Halon, MD

## 2022-11-23 NOTE — Assessment & Plan Note (Signed)
Right sided ear discharge and middle ear effusion Started ofloxacin otic drops If persistent, will refer to ENT specialist

## 2022-11-23 NOTE — Assessment & Plan Note (Addendum)
Smokes 0.5 pack/day, has cut down from 1 pack/day  Asked about quitting: confirms that he currently smokes cigarettes Advise to quit smoking: Educated about QUITTING to reduce the risk of cancer, cardio and cerebrovascular disease. Assess willingness: Unwilling to quit at this time, but is working on cutting back. Assist with counseling and pharmacotherapy: Counseled for 5 minutes and literature provided. Arrange for follow up: follow up in 3 months and continue to offer help.

## 2022-11-23 NOTE — Assessment & Plan Note (Addendum)
Overall well-controlled On Propranolol for ppx Tylenol PRN

## 2022-11-23 NOTE — Assessment & Plan Note (Addendum)
Well-controlled Used to be followed by [facility] Was on Abilify, BuSpar and Xanax as needed, needs to discuss with Psychiatry for maintenance treatment Continue Atarax as needed for anxiety/insomnia

## 2022-11-23 NOTE — Patient Instructions (Addendum)
Please apply Ofloxacin ear drops to right ear as prescribed.  Please take Celecoxib as needed for right knee pain.  Please continue to take medications as prescribed.  Please continue to follow low carb diet and perform moderate exercise/walking at least 150 mins/week.

## 2022-11-23 NOTE — Assessment & Plan Note (Signed)
BP Readings from Last 1 Encounters:  11/23/22 106/65   Well-controlled with Olmesartan-HCTZ 20-12.5 mg QD Counseled for compliance with the medications Advised DASH diet and moderate exercise/walking, at least 150 mins/week

## 2022-11-23 NOTE — Assessment & Plan Note (Signed)
Was On Gemfibrozil, now on Atorvastatin Checked lipid profile

## 2022-11-23 NOTE — Assessment & Plan Note (Signed)
BMI Readings from Last 3 Encounters:  11/23/22 33.80 kg/m  10/25/22 33.77 kg/m  05/13/22 33.82 kg/m   Associated with HTN, GERD, bipolar depression and HLD Has lost about 54 lbs with alcohol cessation and diet modification, now stable Moderate exercise advised

## 2022-11-23 NOTE — Assessment & Plan Note (Signed)
Has sildenafil as needed, but has not had optimal response with 100 mg dose Switched to Cialis 20 mg as needed

## 2022-11-23 NOTE — Assessment & Plan Note (Signed)
Reports remote injuries Celebrex as needed for pain If persistent, will get x-ray of knee and/or orthopedic surgery evaluation

## 2022-11-30 ENCOUNTER — Encounter: Payer: Self-pay | Admitting: Internal Medicine

## 2023-01-21 ENCOUNTER — Other Ambulatory Visit: Payer: Self-pay | Admitting: Internal Medicine

## 2023-01-21 DIAGNOSIS — K219 Gastro-esophageal reflux disease without esophagitis: Secondary | ICD-10-CM

## 2023-01-21 MED ORDER — OMEPRAZOLE 40 MG PO CPDR: 40.0000 mg | DELAYED_RELEASE_CAPSULE | Freq: Every day | ORAL | 0 refills | Status: DC

## 2023-01-21 NOTE — Telephone Encounter (Signed)
 Copied from CRM [id number]. Topic: Clinical - Medication Refill >> Jan 21, 2023  1:57 PM Powell HERO wrote: Most Recent Primary Care Visit:  Provider: TOBIE SUZZANE POUR  Department: [facility]  Visit Type: OFFICE VISIT  Date: 11/23/2022  Medication: omeprazole  (PRILOSEC) 40 MG capsule  Has the patient contacted their pharmacy? No, they have no refills  Is this the correct pharmacy for this prescription? Yes If no, delete pharmacy and type the correct one.  This is the patient's preferred pharmacy:   [facility] [address], [place] - [address] [place] [address] [place] [address] Phone: [phone] Fax: [phone]   Has the prescription been filled recently? No  Is the patient out of the medication? Yes  Has the patient been seen for an appointment in the last year OR does the patient have an upcoming appointment? Yes  Can we respond through MyChart? Through phone please  Agent: Please be advised that Rx refills may take up to 3 business days. We ask that you follow-up with your pharmacy.

## 2023-01-24 ENCOUNTER — Other Ambulatory Visit: Payer: Self-pay

## 2023-01-24 DIAGNOSIS — K219 Gastro-esophageal reflux disease without esophagitis: Secondary | ICD-10-CM

## 2023-01-24 MED ORDER — OMEPRAZOLE 40 MG PO CPDR: 40.0000 mg | DELAYED_RELEASE_CAPSULE | Freq: Every day | ORAL | 0 refills | Status: DC

## 2023-02-01 ENCOUNTER — Other Ambulatory Visit: Payer: Self-pay | Admitting: Internal Medicine

## 2023-02-01 DIAGNOSIS — I10 Essential (primary) hypertension: Secondary | ICD-10-CM

## 2023-03-08 ENCOUNTER — Other Ambulatory Visit: Payer: Self-pay | Admitting: Internal Medicine

## 2023-03-08 DIAGNOSIS — I10 Essential (primary) hypertension: Secondary | ICD-10-CM

## 2023-03-11 ENCOUNTER — Other Ambulatory Visit: Payer: Self-pay | Admitting: Internal Medicine

## 2023-03-11 ENCOUNTER — Telehealth: Payer: Self-pay

## 2023-03-11 DIAGNOSIS — E782 Mixed hyperlipidemia: Secondary | ICD-10-CM

## 2023-03-11 DIAGNOSIS — I10 Essential (primary) hypertension: Secondary | ICD-10-CM

## 2023-03-11 MED ORDER — OLMESARTAN MEDOXOMIL-HCTZ 20-12.5 MG PO TABS: 1.0000 | ORAL_TABLET | Freq: Every day | ORAL | 0 refills | Status: DC

## 2023-03-11 MED ORDER — FLUTICASONE PROPIONATE 50 MCG/ACT NA SUSP: NASAL | 1 refills | Status: DC

## 2023-03-11 MED ORDER — ATORVASTATIN CALCIUM 20 MG PO TABS: 20.0000 mg | ORAL_TABLET | Freq: Every day | ORAL | 1 refills | Status: DC

## 2023-03-11 NOTE — Telephone Encounter (Signed)
 Copied from CRM [id number]. Topic: Clinical - Medication Refill >> Mar 11, 2023  2:18 PM Gery Pray wrote: Most Recent Primary Care Visit:  Provider: Anabel Halon  Department: [facility]  Visit Type: OFFICE VISIT  Date: 11/23/2022  Medication: ALPRAZolam (XANAX) 1 MG tablet, atorvastatin (LIPITOR) 20 MG tablet, fluticasone (FLONASE) 50 MCG/ACT nasal spray, olmesartan-hydrochlorothiazide (BENICAR HCT) 20-12.5 MG tablet  Has the patient contacted their pharmacy? Yes (Agent: If no, request that the patient contact the pharmacy for the refill. If patient does not wish to contact the pharmacy document the reason why and proceed with request.) (Agent: If yes, when and what did the pharmacy advise?)  Is this the correct pharmacy for this prescription? Yes If no, delete pharmacy and type the correct one.  This is the patient's preferred pharmacy:  [facility] [address], [place] - [address] [place] [address] [place] [address] Phone: [phone] Fax: [phone]   Has the prescription been filled recently? Yes  Is the patient out of the medication? Yes  Has the patient been seen for an appointment in the last year OR does the patient have an upcoming appointment? Yes  Can we respond through MyChart? No  Agent: Please be advised that Rx refills may take up to 3 business days. We ask that you follow-up with your pharmacy.

## 2023-03-14 ENCOUNTER — Other Ambulatory Visit: Payer: Self-pay | Admitting: Internal Medicine

## 2023-03-14 NOTE — Telephone Encounter (Signed)
 Pt informed

## 2023-03-22 ENCOUNTER — Telehealth: Payer: Self-pay | Admitting: Internal Medicine

## 2023-03-22 NOTE — Telephone Encounter (Signed)
 Looks like he was dispensed a 90 day supply on 1/14- that's why they will not fill until April- he should have enough to last til then.

## 2023-03-22 NOTE — Telephone Encounter (Signed)
 Copied from CRM [id number]. Topic: Clinical - Prescription Issue >> Mar 18, 2023  2:49 PM Antwanette L wrote: Reason for CRM: Patient is calling in about prescription refills for olmesartan-hydrochlorothiazide (BENICAR HCT) 20-12.5 MG tablet and atorvastatin (LIPITOR) 20 MG tablet. An order was placed on 03/11/23 but [facility] will not release the medicine to the patient because their both a 90 day and supply and the insurance company will not cover it. [facility] said the patient can get a refill between 4/12 and 4/13. Patient said he is currently out of the medicine. Patient can be contacted by phone at [phone]. Listed below is the patient preferred pharmacy.   [facility] [address], [place] - [address], [place] [address] Phone: [phone]  Fax: [phone]

## 2023-03-23 ENCOUNTER — Other Ambulatory Visit: Payer: Self-pay | Admitting: Internal Medicine

## 2023-03-23 DIAGNOSIS — H65191 Other acute nonsuppurative otitis media, right ear: Secondary | ICD-10-CM

## 2023-03-23 MED ORDER — OFLOXACIN 0.3 % OT SOLN: 5.0000 [drp] | Freq: Every day | OTIC | 0 refills | Status: DC

## 2023-03-23 NOTE — Telephone Encounter (Signed)
 Copied from CRM [id number]. Topic: Clinical - Medication Refill >> Mar 23, 2023 12:40 PM Geroge Baseman wrote: Most Recent Primary Care Visit:  Provider: Anabel Halon  Department: [facility]  Visit Type: OFFICE VISIT  Date: 11/23/2022  Medication: ofloxacin (FLOXIN) 0.3 % OTIC solution  Has the patient contacted their pharmacy? Yes Call office  Is this the correct pharmacy for this prescription? Yes If no, delete pharmacy and type the correct one.  This is the patient's preferred pharmacy:  [facility] [address] [place] - [address] [place] [address] [place] [address] Phone: [phone] Fax: [phone]   Has the prescription been filled recently? No  Is the patient out of the medication? Yes  Has the patient been seen for an appointment in the last year OR does the patient have an upcoming appointment? Yes  Can we respond through MyChart? No  Agent: Please be advised that Rx refills may take up to 3 business days. We ask that you follow-up with your pharmacy.

## 2023-04-04 ENCOUNTER — Ambulatory Visit: Payer: Self-pay | Admitting: Internal Medicine

## 2023-04-04 ENCOUNTER — Encounter: Payer: Self-pay | Admitting: Family Medicine

## 2023-04-04 ENCOUNTER — Ambulatory Visit (INDEPENDENT_AMBULATORY_CARE_PROVIDER_SITE_OTHER): Payer: Medicare (Managed Care) | Admitting: Family Medicine

## 2023-04-04 VITALS — BP 118/79 | HR 57 | Ht 73.0 in | Wt 261.1 lb

## 2023-04-04 DIAGNOSIS — H6593 Unspecified nonsuppurative otitis media, bilateral: Secondary | ICD-10-CM | POA: Diagnosis not present

## 2023-04-04 MED ORDER — DOXYCYCLINE HYCLATE 100 MG PO TABS: 100.0000 mg | ORAL_TABLET | Freq: Two times a day (BID) | ORAL | 0 refills | Status: AC

## 2023-04-04 NOTE — Assessment & Plan Note (Signed)
 Referral placed to ENT Encouraged to start taking Doxycycline 100 mg twice daily for 7 days. Continue Ofloxacin otic solution as prescribed. Non-Pharmacological Interventions: Avoid inserting Q-tips or other objects into the ear to prevent further irritation. Apply warm compresses over the affected ear to help relieve discomfort. Stay well-hydrated to promote natural drainage of fluid from the middle ear. Use nasal saline sprays or steam inhalation to help clear nasal congestion and improve Eustachian tube function. Practice the Valsalva maneuver (gentle ear popping technique) to help open the Eustachian tubes Avoid exposure to smoke, allergens, and environmental irritants that may worsen symptoms. follow up if symptoms persist or worsen

## 2023-04-04 NOTE — Patient Instructions (Addendum)
 I appreciate the opportunity to provide care to you today!    Otitis Media with Effusion  Start taking Doxycycline 100 mg twice daily for 7 days. Continue Ofloxacin otic solution as prescribed. Non-Pharmacological Interventions: Avoid inserting Q-tips or other objects into the ear to prevent further irritation. Apply warm compresses over the affected ear to help relieve discomfort. Stay well-hydrated to promote natural drainage of fluid from the middle ear. Use nasal saline sprays or steam inhalation to help clear nasal congestion and improve Eustachian tube function. Practice the Valsalva maneuver (gentle ear popping technique) to help open the Eustachian tubes Avoid exposure to smoke, allergens, and environmental irritants that may worsen symptoms.  follow up if symptoms persist or worsen   Referrals today- ENT    Please continue to a heart-healthy diet and increase your physical activities. Try to exercise for at least five days a week.    It was a pleasure to see you and I look forward to continuing to work together on your health and well-being. Please do not hesitate to call the office if you need care or have questions about your care.  In case of emergency, please visit the Emergency Department for urgent care, or contact our clinic at [phone] to schedule an appointment. We're here to help you!   Have a wonderful day and week. With Gratitude, Gilmore Laroche MSN, FNP-BC

## 2023-04-04 NOTE — Telephone Encounter (Signed)
 Chief Complaint: Left ear intermittent bleeding/pain Symptoms: see above Frequency: 1 monh Pertinent Negatives: Patient denies fever, head injury Disposition: [] ED /[] Urgent Care (no appt availability in office) / [x] Appointment(In office/virtual)/ []  [place] [facility]/ [] Home Care/ [] Refused Recommended Disposition /[] [place] [facility]/ []  Follow-up with PCP Additional Notes: Patient called to check on appt time for today and thought he already had an appt scheduled. This RN checked with CAL because Epic did not show any appt, and CAL denied appt as well. Scheduled for today with HCP for evaluation of left ear. Patient states he had called in to the office prior about problems with his ear and was prescribed ear drops. Patient states he puts ear drops in, puts cotton ball in and when he removes cotton ball there is blood in the ear. Patient also has intermittent pain rating 4/10. Patient states he has dizziness when turning his head at times. Patient states he believes he may have inner ear infection, stating he has struggled with problems with his ears his whole life. Patient appt made for today for further evaluation.   Copied from CRM [id number]. Topic: Clinical - Red Word Triage >> Apr 04, 2023 11:36 AM [place] P wrote: [facility] that prompted transfer to Nurse Triage: patient has been experiencing bleeding in the ears and said he called but no appt was created Reason for Disposition  Unexplained bleeding from ear  Answer Assessment - Initial Assessment Questions 1. LOCATION: "Which ear is involved?"      Left ear 2. COLOR: "What is the color of the discharge?"      Blood 3. CONSISTENCY: "How runny is the discharge? Could it be water?"      Using ear drops  4. ONSET: "When did you first notice the discharge?"     A month 5. PAIN: "Is there any earache?" "How bad is it?"  (Scale 1-10; or mild, moderate, severe)     Sometimes, rating 3-4 6. OBJECTS: "Have you put anything in your  ear?" (e.g., Q-tip, other object)      Patient puts cotton swab after ear drop 7. OTHER SYMPTOMS: "Do you have any other symptoms?" (e.g., headache, fever, dizziness, vomiting, runny nose)     When turn head feels dizziness  Protocols used: Ear - Discharge-A-AH

## 2023-04-04 NOTE — Progress Notes (Unsigned)
 Acute Office Visit  Subjective:    Patient ID: Mark Goodman, male    DOB: 06/30/1980, 43 y.o.   MRN: [id number]  Chief Complaint  Patient presents with   Acute Visit    Bleeding in ears: pt. Has drainage substance looks like blood on q-tip as well as cotton  Chronic ear infections w/ smell when draining. Ears are tender to touch. In ear well also in tubes  Has been using ear drops (floxen) which help.     HPI The patient presents today with complaints of decreased hearing, brown/yellow and occasionally red drainage from both ears, and ear pain for the past month. The patient has been using Ofloxacin 0.3% otic solution with some relief. Additionally, the patient reports drainage that appears to contain blood on a Q-tip and cotton. The patient has a history of chronic ear infections with foul-smelling drainage. He reports that his ears are tender to touch, including in the ear canal and around the tubes.   Past Medical History:  Diagnosis Date   ADHD (attention deficit hyperactivity disorder)    Bipolar 1 disorder (HCC)    Cleft uvula    Hypertension    PTSD (post-traumatic stress disorder)     Past Surgical History:  Procedure Laterality Date   EXTERNAL EAR SURGERY      History reviewed. No pertinent family history.  Social History   Socioeconomic History   Marital status: Married    Spouse name: Not on file   Number of children: 2   Years of education: Not on file   Highest education level: Not on file  Occupational History   Not on file  Tobacco Use   Smoking status: Every Day    Current packs/day: 1.00    Average packs/day: 1 pack/day for 20.0 years (20.0 ttl pk-yrs)    Types: Cigarettes   Smokeless tobacco: Never  Vaping Use   Vaping status: Never Used  Substance and Sexual Activity   Alcohol use: No   Drug use: No   Sexual activity: Not on file  Other Topics Concern   Not on file  Social History Narrative   Currently on disability.   2  children   Social Drivers of Corporate investment banker Strain: Low Risk  (10/25/2022)   Overall Financial Resource Strain (CARDIA)    Difficulty of Paying Living Expenses: Not hard at all  Food Insecurity: No Food Insecurity (10/25/2022)   Hunger Vital Sign    Worried About Running Out of Food in the Last Year: Never true    Ran Out of Food in the Last Year: Never true  Transportation Needs: No Transportation Needs (10/25/2022)   PRAPARE - Administrator, Civil Service (Medical): No    Lack of Transportation (Non-Medical): No  Physical Activity: Unknown (10/25/2022)   Exercise Vital Sign    Days of Exercise per Week: 3 days    Minutes of Exercise per Session: Not on file  Stress: No Stress Concern Present (10/25/2022)   [employer] of Occupational Health - Occupational Stress Questionnaire    Feeling of Stress : Not at all  Social Connections: Moderately Integrated (10/25/2022)   Social Connection and Isolation Panel [NHANES]    Frequency of Communication with Friends and Family: More than three times a week    Frequency of Social Gatherings with Friends and Family: More than three times a week    Attends Religious Services: More than 4 times per year  Active Member of Clubs or Organizations: No    Attends Banker Meetings: Never    Marital Status: Married  Catering manager Violence: Not At Risk (10/25/2022)   Humiliation, Afraid, Rape, and Kick questionnaire    Fear of Current or Ex-Partner: No    Emotionally Abused: No    Physically Abused: No    Sexually Abused: No    Outpatient Medications Prior to Visit  Medication Sig Dispense Refill   albuterol (VENTOLIN HFA) 108 (90 Base) MCG/ACT inhaler Inhale 2 puffs into the lungs every 6 (six) hours as needed for wheezing or shortness of breath. 8 g 0   ALPRAZolam (XANAX) 1 MG tablet Take 0.5-1 mg by mouth daily as needed.     aspirin EC 81 MG tablet Take 1 tablet (81 mg total) by mouth daily. Swallow  whole. 30 tablet 11   atorvastatin (LIPITOR) 20 MG tablet Take 1 tablet (20 mg total) by mouth daily. 90 tablet 1   celecoxib (CELEBREX) 200 MG capsule Take 1 capsule (200 mg total) by mouth daily as needed for mild pain (pain score 1-3) or moderate pain (pain score 4-6). 30 capsule 0   fluticasone (FLONASE) 50 MCG/ACT nasal spray SMARTSIG:1-2 Spray(s) Both Nares Daily PRN 16 g 1   hydrOXYzine (ATARAX) 50 MG tablet TAKE 1 TABLET BY MOUTH ONCE AT BEDTIME AS NEEDED FOR ANXIETY. 30 tablet 0   ofloxacin (FLOXIN) 0.3 % OTIC solution Place 5 drops into the right ear daily. 5 mL 0   olmesartan-hydrochlorothiazide (BENICAR HCT) 20-12.5 MG tablet Take 1 tablet by mouth daily. 90 tablet 0   omeprazole (PRILOSEC) 40 MG capsule Take 1 capsule (40 mg total) by mouth daily. 90 capsule 0   propranolol (INDERAL) 20 MG tablet Take 1 tablet (20 mg total) by mouth 2 (two) times daily. 60 tablet 5   tadalafil (CIALIS) 20 MG tablet Take 1 tablet (20 mg total) by mouth as needed for erectile dysfunction. 30 tablet 1   No facility-administered medications prior to visit.    Allergies  Allergen Reactions   Tomato Anaphylaxis   Penicillins Rash    Has patient had a PCN reaction causing immediate rash, facial/tongue/throat swelling, SOB or lightheadedness with hypotension: {Yes/No:[id number]} unknown Has patient had a PCN reaction causing severe rash involving mucus membranes or skin necrosis: yes Has patient had a PCN reaction that required hospitalization } no Has patient had a PCN reaction occurring within the last 10 years: No If all of the above answers are "NO", then may proceed with Cephalosporin use.     Review of Systems  Constitutional:  Negative for fatigue and fever.  HENT:  Positive for ear discharge and ear pain.   Eyes:  Negative for visual disturbance.  Respiratory:  Negative for chest tightness and shortness of breath.   Cardiovascular:  Negative for chest pain and palpitations.  Neurological:   Negative for dizziness and headaches.       Objective:    Physical Exam HENT:     Head: Normocephalic.     Right Ear: External ear normal. Tenderness present. A middle ear effusion is present. Tympanic membrane is bulging.     Left Ear: External ear normal. Tenderness present. A middle ear effusion is present. Tympanic membrane is scarred.     Nose: No congestion or rhinorrhea.     Mouth/Throat:     Mouth: Mucous membranes are moist.  Cardiovascular:     Rate and Rhythm: Regular rhythm.     Heart  sounds: No murmur heard. Pulmonary:     Effort: No respiratory distress.     Breath sounds: Normal breath sounds.  Neurological:     Mental Status: He is alert.     BP 118/79   Pulse (!) 57   Ht 6\' 1"  (1.854 m)   Wt 261 lb 1.9 oz (118.4 kg)   SpO2 97%   BMI 34.45 kg/m  Wt Readings from Last 3 Encounters:  04/04/23 261 lb 1.9 oz (118.4 kg)  11/23/22 256 lb 3.2 oz (116.2 kg)  10/25/22 249 lb (112.9 kg)       Assessment & Plan:  Bilateral otitis media with effusion Assessment & Plan: Referral placed to ENT Encouraged to start taking Doxycycline 100 mg twice daily for 7 days. Continue Ofloxacin otic solution as prescribed. Non-Pharmacological Interventions: Avoid inserting Q-tips or other objects into the ear to prevent further irritation. Apply warm compresses over the affected ear to help relieve discomfort. Stay well-hydrated to promote natural drainage of fluid from the middle ear. Use nasal saline sprays or steam inhalation to help clear nasal congestion and improve Eustachian tube function. Practice the Valsalva maneuver (gentle ear popping technique) to help open the Eustachian tubes Avoid exposure to smoke, allergens, and environmental irritants that may worsen symptoms. follow up if symptoms persist or worsen  Orders: -     Ambulatory referral to ENT -     Doxycycline Hyclate; Take 1 tablet (100 mg total) by mouth 2 (two) times daily for 7 days.  Dispense: 14  tablet; Refill: 0   Note: This chart has been completed using Engineer, civil (consulting) software, and while attempts have been made to ensure accuracy, certain words and phrases may not be transcribed as intended.   Gilmore Laroche, FNP

## 2023-04-19 ENCOUNTER — Telehealth: Payer: Self-pay | Admitting: Internal Medicine

## 2023-04-19 NOTE — Telephone Encounter (Unsigned)
 Copied from CRM [id number]. Topic: Referral - Status >> Apr 19, 2023  9:17 AM Izetta Dakin wrote: Reason for CRM: Patient is requesting an update on ENT referral. Patient requesting a callback.

## 2023-04-20 NOTE — Telephone Encounter (Signed)
 R/C Patient - Had to leave a message, also sent a MyChart Message to [facility] with Office contact information.

## 2023-04-29 ENCOUNTER — Encounter (INDEPENDENT_AMBULATORY_CARE_PROVIDER_SITE_OTHER): Payer: Self-pay | Admitting: Otolaryngology

## 2023-05-03 ENCOUNTER — Ambulatory Visit: Payer: Medicare (Managed Care)

## 2023-05-03 ENCOUNTER — Ambulatory Visit: Payer: Self-pay

## 2023-05-03 VITALS — BP 124/79 | HR 63 | Ht 72.0 in | Wt 264.1 lb

## 2023-05-03 DIAGNOSIS — H6593 Unspecified nonsuppurative otitis media, bilateral: Secondary | ICD-10-CM | POA: Diagnosis not present

## 2023-05-03 MED ORDER — IBUPROFEN 600 MG PO TABS: 600.0000 mg | ORAL_TABLET | Freq: Three times a day (TID) | ORAL | 0 refills | Status: DC | PRN

## 2023-05-03 MED ORDER — AZITHROMYCIN 250 MG PO TABS: ORAL_TABLET | ORAL | 0 refills | Status: AC

## 2023-05-03 NOTE — Telephone Encounter (Signed)
 Copied from CRM [id number]. Topic: Clinical - Red Word Triage >> May 03, 2023  1:10 PM Makayla J wrote: [facility] that prompted transfer to Nurse Triage: Ears bleeding   Chief Complaint: Ears- Bleeding  Symptoms: Ache, Drainage  Frequency: Acute  Pertinent Negatives: Patient denies fever, runny nose, cough, injury, or dizziness.   Disposition: [] ED /[] Urgent Care (no appt availability in office) / [x] Appointment(In office/virtual)/ []  [place] [facility]/ [] Home Care/ [] Refused Recommended Disposition /[] [place] [facility]/ []  Follow-up with PCP Additional Notes: GB is being triaged for his ears bleeding. The patient has bleeding coming from his left ear. The patient is to be seen in office today.   Reason for Disposition  Unexplained bleeding from ear  Answer Assessment - Initial Assessment Questions 1. LOCATION: "Which ear is involved?"      Left Ear  2. COLOR: "What is the color of the discharge?"      Bloody   3. CONSISTENCY: "How runny is the discharge? Could it be water?"      Bright red blood  4. ONSET: "When did you first notice the discharge?"     Today  5. PAIN: "Is there any earache?" "How bad is it?"  (Scale 1-10; or mild, moderate, severe)     Mild, Intermittent  6. OBJECTS: "Have you put anything in your ear?" (e.g., Q-tip, other object)      Nothing  7. OTHER SYMPTOMS: "Do you have any other symptoms?" (e.g., headache, fever, dizziness, vomiting, runny nose)     No  Protocols used: Ear - Discharge-A-AH

## 2023-05-03 NOTE — Progress Notes (Signed)
 Acute Office Visit  Subjective:     Patient ID: Mark Goodman, male    DOB: February 01, 1980, 43 y.o.   MRN: [id number]  Chief Complaint  Patient presents with   Ear Drainage    Pt states "started yesterday draining and left ear been bleeding and has some pain"     HPI Patient is in today for ear drainage.  He was treated on 3/17 for bilateral ear infection.  He has been on a 7 day course of doxycycline  and ofloxacin  ear drops since that time.  He reports improvement initially, but they have returned over the last week.   Review of Systems  Constitutional: Negative.   HENT:  Positive for congestion, ear discharge, ear pain, hearing loss and sinus pain. Negative for nosebleeds, sore throat and tinnitus.   Eyes: Negative.   Respiratory: Negative.    Cardiovascular: Negative.   Gastrointestinal:  Negative for nausea and vomiting.  Skin: Negative.   Neurological: Negative.   Psychiatric/Behavioral: Negative.          Objective:    BP 124/79   Pulse 63   Ht 6' (1.829 m)   Wt 264 lb 1.9 oz (119.8 kg)   SpO2 95%   BMI 35.82 kg/m    Physical Exam Vitals and nursing note reviewed.  Constitutional:      Appearance: Normal appearance.  HENT:     Head: Normocephalic.     Right Ear: Ear canal and external ear normal. Swelling and tenderness present. A middle ear effusion is present. Tympanic membrane is erythematous.     Left Ear: Ear canal and external ear normal. No swelling or tenderness.  No middle ear effusion. Tympanic membrane is erythematous.     Nose: Congestion and rhinorrhea present.     Right Turbinates: Enlarged.     Left Turbinates: Enlarged.     Right Sinus: No maxillary sinus tenderness or frontal sinus tenderness.     Left Sinus: No maxillary sinus tenderness or frontal sinus tenderness.     Mouth/Throat:     Mouth: Mucous membranes are moist.     Pharynx: Oropharynx is clear.  Eyes:     Extraocular Movements: Extraocular movements intact.     Pupils:  Pupils are equal, round, and reactive to light.  Pulmonary:     Effort: Pulmonary effort is normal.     Breath sounds: Normal breath sounds.  Skin:    General: Skin is warm and dry.  Neurological:     Mental Status: He is alert and oriented to person, place, and time.  Psychiatric:        Mood and Affect: Mood normal.        Thought Content: Thought content normal.     No results found for any visits on 05/03/23.      Assessment & Plan:   Problem List Items Addressed This Visit       Nervous and Auditory   Bilateral otitis media with effusion - Primary   Relevant Medications   azithromycin  (ZITHROMAX ) 250 MG tablet   ibuprofen  (ADVIL ) 600 MG tablet   Other Relevant Orders   Ambulatory referral to ENT    Meds ordered this encounter  Medications   azithromycin  (ZITHROMAX ) 250 MG tablet    Sig: Take 2 tablets on day 1, then 1 tablet daily on days 2 through 5    Dispense:  6 tablet    Refill:  0   ibuprofen  (ADVIL ) 600 MG tablet  Sig: Take 1 tablet (600 mg total) by mouth every 8 (eight) hours as needed for moderate pain (pain score 4-6).    Dispense:  30 tablet    Refill:  0    No follow-ups on file.  Alison Irvine, FNP

## 2023-05-24 ENCOUNTER — Encounter: Payer: Self-pay | Admitting: Internal Medicine

## 2023-05-24 ENCOUNTER — Ambulatory Visit (INDEPENDENT_AMBULATORY_CARE_PROVIDER_SITE_OTHER): Payer: Medicare (Managed Care) | Admitting: Internal Medicine

## 2023-05-24 VITALS — BP 116/72 | HR 91 | Ht 76.0 in | Wt 266.0 lb

## 2023-05-24 DIAGNOSIS — G43809 Other migraine, not intractable, without status migrainosus: Secondary | ICD-10-CM

## 2023-05-24 DIAGNOSIS — I10 Essential (primary) hypertension: Secondary | ICD-10-CM | POA: Diagnosis not present

## 2023-05-24 DIAGNOSIS — Z0001 Encounter for general adult medical examination with abnormal findings: Secondary | ICD-10-CM

## 2023-05-24 DIAGNOSIS — L918 Other hypertrophic disorders of the skin: Secondary | ICD-10-CM | POA: Diagnosis not present

## 2023-05-24 DIAGNOSIS — F319 Bipolar disorder, unspecified: Secondary | ICD-10-CM

## 2023-05-24 DIAGNOSIS — R7303 Prediabetes: Secondary | ICD-10-CM | POA: Diagnosis not present

## 2023-05-24 DIAGNOSIS — F17210 Nicotine dependence, cigarettes, uncomplicated: Secondary | ICD-10-CM | POA: Diagnosis not present

## 2023-05-24 DIAGNOSIS — Z720 Tobacco use: Secondary | ICD-10-CM

## 2023-05-24 DIAGNOSIS — E782 Mixed hyperlipidemia: Secondary | ICD-10-CM

## 2023-05-24 DIAGNOSIS — H663X3 Other chronic suppurative otitis media, bilateral: Secondary | ICD-10-CM | POA: Insufficient documentation

## 2023-05-24 DIAGNOSIS — E559 Vitamin D deficiency, unspecified: Secondary | ICD-10-CM | POA: Diagnosis not present

## 2023-05-24 DIAGNOSIS — G4733 Obstructive sleep apnea (adult) (pediatric): Secondary | ICD-10-CM

## 2023-05-24 MED ORDER — ATORVASTATIN CALCIUM 20 MG PO TABS: 20.0000 mg | ORAL_TABLET | Freq: Every day | ORAL | 3 refills | Status: DC

## 2023-05-24 MED ORDER — OLMESARTAN MEDOXOMIL-HCTZ 20-12.5 MG PO TABS: 1.0000 | ORAL_TABLET | Freq: Every day | ORAL | 3 refills | Status: DC

## 2023-05-24 MED ORDER — PROPRANOLOL HCL 20 MG PO TABS: 20.0000 mg | ORAL_TABLET | Freq: Two times a day (BID) | ORAL | 5 refills | Status: DC

## 2023-05-24 NOTE — Assessment & Plan Note (Signed)
 BP Readings from Last 1 Encounters:  05/03/23 124/79   Well-controlled with Olmesartan -HCTZ 20-12.5 mg QD Counseled for compliance with the medications Advised DASH diet and moderate exercise/walking, at least 150 mins/week

## 2023-05-24 NOTE — Assessment & Plan Note (Addendum)
 Overall well-controlled On Propranolol  for ppx, takes it 20 mg QD Tylenol  PRN

## 2023-05-24 NOTE — Patient Instructions (Signed)
Please continue to take medications as prescribed.  Please continue to follow low carb diet and perform moderate exercise/walking at least 150 mins/week.  Please get fasting blood tests done within a week.  

## 2023-05-24 NOTE — Assessment & Plan Note (Signed)
 Smokes 1 pack/day, has stopped vaping now  Asked about quitting: confirms that he currently smokes cigarettes Advise to quit smoking: Educated about QUITTING to reduce the risk of cancer, cardio and cerebrovascular disease. Assess willingness: Unwilling to quit at this time, but is working on cutting back. Assist with counseling and pharmacotherapy: Counseled for 5 minutes and literature provided. Arrange for follow up: follow up in 3 months and continue to offer help.

## 2023-05-24 NOTE — Assessment & Plan Note (Signed)
Physical exam as documented. ?Fasting blood tests ordered today. ?

## 2023-05-24 NOTE — Progress Notes (Addendum)
 Established Patient Office Visit  Subjective:  Patient ID: Mark Goodman, male    DOB: 1980-11-24  Age: 43 y.o. MRN: [id number]  CC:  Chief Complaint  Patient presents with   Annual Exam    Cpe.     HPI Mark Goodman is a 43 y.o. male with past medical history of HTN, HLD, Bipolar disorder, anxiety, GERD and tobacco abuse who presents for annual physical.  HTN: BP is well-controlled now. Takes medications regularly. Patient denies headache, dizziness, chest pain, dyspnea or palpitations.   He has witnessed apnea spells, snoring and daytime fatigue. He has not been able to get sleep study yet due to cost concern, but agrees to do it now. He had lost about 22 lbs with alcohol cessation and diet modification, but has been gaining weight lately.  He has cut down smoking to 0.5 pack/day from 1 pack/day.  He complains of left ear discharge, whitish discharge also, for the last 2 months.  He also reports mild left ear pain. He was seen by NP, has completed oral Doxycycline  and later Azithromycin . He is awaiting ENT evaluation currently, has appt on 06/25. He has had left otitis media before and was given Ofloxacin  ear drops. Denies any fever or chills.  Denies nasal congestion, postnasal drip or sore throat currently.  He used to follow [facility] for bipolar disorder and anxiety, but needs to schedule follow-up.  He was on Abilify , as needed Xanax and BuSpar  currently, but has stopped taking them. He takes Xanax occasionally.  He feels better now since meeting her current fiance.  Denies any anhedonia, SI or HI.   Past Medical History:  Diagnosis Date   ADHD (attention deficit hyperactivity disorder)    Bipolar 1 disorder (HCC)    Cleft uvula    Hypertension    PTSD (post-traumatic stress disorder)     Past Surgical History:  Procedure Laterality Date   EXTERNAL EAR SURGERY      History reviewed. No pertinent family history.  Social History    Socioeconomic History   Marital status: Married    Spouse name: Not on file   Number of children: 2   Years of education: Not on file   Highest education level: Not on file  Occupational History   Not on file  Tobacco Use   Smoking status: Every Day    Current packs/day: 1.00    Average packs/day: 1 pack/day for 20.0 years (20.0 ttl pk-yrs)    Types: Cigarettes   Smokeless tobacco: Never  Vaping Use   Vaping status: Never Used  Substance and Sexual Activity   Alcohol use: No   Drug use: No   Sexual activity: Not on file  Other Topics Concern   Not on file  Social History Narrative   Currently on disability.   2 children   Social Drivers of Corporate investment banker Strain: Low Risk  (10/25/2022)   Overall Financial Resource Strain (CARDIA)    Difficulty of Paying Living Expenses: Not hard at all  Food Insecurity: No Food Insecurity (10/25/2022)   Hunger Vital Sign    Worried About Running Out of Food in the Last Year: Never true    Ran Out of Food in the Last Year: Never true  Transportation Needs: No Transportation Needs (10/25/2022)   PRAPARE - Administrator, Civil Service (Medical): No    Lack of Transportation (Non-Medical): No  Physical Activity: Unknown (10/25/2022)  Exercise Vital Sign    Days of Exercise per Week: 3 days    Minutes of Exercise per Session: Not on file  Stress: No Stress Concern Present (10/25/2022)   [employer] of Occupational Health - Occupational Stress Questionnaire    Feeling of Stress : Not at all  Social Connections: Moderately Integrated (10/25/2022)   Social Connection and Isolation Panel [NHANES]    Frequency of Communication with Friends and Family: More than three times a week    Frequency of Social Gatherings with Friends and Family: More than three times a week    Attends Religious Services: More than 4 times per year    Active Member of [employer] or Organizations: No    Attends Banker Meetings:  Never    Marital Status: Married  Catering manager Violence: Not At Risk (10/25/2022)   Humiliation, Afraid, Rape, and Kick questionnaire    Fear of Current or Ex-Partner: No    Emotionally Abused: No    Physically Abused: No    Sexually Abused: No    Outpatient Medications Prior to Visit  Medication Sig Dispense Refill   albuterol  (VENTOLIN  HFA) 108 (90 Base) MCG/ACT inhaler Inhale 2 puffs into the lungs every 6 (six) hours as needed for wheezing or shortness of breath. 8 g 0   ALPRAZolam (XANAX) 1 MG tablet Take 0.5-1 mg by mouth daily as needed.     aspirin  EC 81 MG tablet Take 1 tablet (81 mg total) by mouth daily. Swallow whole. 30 tablet 11   fluticasone  (FLONASE ) 50 MCG/ACT nasal spray SMARTSIG:1-2 Spray(s) Both Nares Daily PRN 16 g 1   ibuprofen  (ADVIL ) 600 MG tablet Take 1 tablet (600 mg total) by mouth every 8 (eight) hours as needed for moderate pain (pain score 4-6). 30 tablet 0   omeprazole  (PRILOSEC) 40 MG capsule Take 1 capsule (40 mg total) by mouth daily. 90 capsule 0   tadalafil  (CIALIS ) 20 MG tablet Take 1 tablet (20 mg total) by mouth as needed for erectile dysfunction. 30 tablet 1   atorvastatin  (LIPITOR) 20 MG tablet Take 1 tablet (20 mg total) by mouth daily. 90 tablet 1   ofloxacin  (FLOXIN ) 0.3 % OTIC solution Place 5 drops into the right ear daily. 5 mL 0   olmesartan -hydrochlorothiazide (BENICAR  HCT) 20-12.5 MG tablet Take 1 tablet by mouth daily. 90 tablet 0   propranolol  (INDERAL ) 20 MG tablet Take 1 tablet (20 mg total) by mouth 2 (two) times daily. 60 tablet 5   No facility-administered medications prior to visit.      ROS Review of Systems  Constitutional:  Negative for chills and fever.  HENT:  Positive for ear discharge and ear pain. Negative for congestion and sore throat.   Eyes:  Negative for pain and discharge.  Respiratory:  Negative for cough and shortness of breath.   Cardiovascular:  Negative for chest pain and palpitations.   Gastrointestinal:  Negative for diarrhea, nausea and vomiting.  Endocrine: Negative for polydipsia and polyuria.  Genitourinary:  Negative for dysuria and hematuria.  Musculoskeletal:  Positive for arthralgias (Left knee). Negative for neck pain and neck stiffness.  Skin:  Negative for rash.  Neurological:  Negative for dizziness, weakness, numbness and headaches.  Psychiatric/Behavioral:  Negative for agitation, behavioral problems and sleep disturbance. The patient is not nervous/anxious.       Objective:    Physical Exam Vitals reviewed.  Constitutional:      General: He is not in acute distress.  Appearance: He is obese. He is not diaphoretic.  HENT:     Head: Normocephalic and atraumatic.     Left Ear: Drainage and tenderness present.     Nose: Nose normal.     Mouth/Throat:     Mouth: Mucous membranes are moist.  Eyes:     General: No scleral icterus.    Extraocular Movements: Extraocular movements intact.  Cardiovascular:     Rate and Rhythm: Normal rate and regular rhythm.     Heart sounds: Normal heart sounds. No murmur heard. Pulmonary:     Breath sounds: Normal breath sounds. No wheezing or rales.  Abdominal:     Palpations: Abdomen is soft.     Tenderness: There is no abdominal tenderness.  Musculoskeletal:     Cervical back: Neck supple. No tenderness.     Right lower leg: No edema.     Left lower leg: No edema.  Skin:    General: Skin is warm.     Findings: No rash.     Comments: Subcutaneous cyst over back of neck -about 1 cm in diameter Skin tag in left armpit  Neurological:     General: No focal deficit present.     Mental Status: He is alert and oriented to person, place, and time.     Sensory: No sensory deficit.     Motor: No weakness.  Psychiatric:        Mood and Affect: Mood normal.        Behavior: Behavior normal.        Cognition and Memory: Cognition normal.     BP 116/72 (BP Location: Right Arm)   Pulse 91   Ht 6\' 4"  (1.93 m)    Wt 266 lb (120.7 kg)   SpO2 98%   BMI 32.38 kg/m  Wt Readings from Last 3 Encounters:  05/24/23 266 lb (120.7 kg)  05/03/23 264 lb 1.9 oz (119.8 kg)  04/04/23 261 lb 1.9 oz (118.4 kg)    Lab Results  Component Value Date   TSH 1.860 05/12/2022   Lab Results  Component Value Date   WBC 11.0 (H) 05/12/2022   HGB 15.5 05/12/2022   HCT 46.0 05/12/2022   MCV 92 05/12/2022   PLT 298 05/12/2022   Lab Results  Component Value Date   NA 137 05/12/2022   K 4.3 05/12/2022   CO2 23 05/12/2022   GLUCOSE 83 05/12/2022   BUN 12 05/12/2022   CREATININE 1.06 05/12/2022   BILITOT 0.5 05/12/2022   ALKPHOS 127 (H) 05/12/2022   AST 15 05/12/2022   ALT 13 05/12/2022   PROT 7.1 05/12/2022   ALBUMIN 4.3 05/12/2022   CALCIUM  9.4 05/12/2022   ANIONGAP 8 05/21/2020   EGFR 89 05/12/2022   Lab Results  Component Value Date   CHOL 185 05/12/2022   Lab Results  Component Value Date   HDL 36 (L) 05/12/2022   Lab Results  Component Value Date   LDLCALC 122 (H) 05/12/2022   Lab Results  Component Value Date   TRIG 150 (H) 05/12/2022   Lab Results  Component Value Date   CHOLHDL 5.1 (H) 05/12/2022   Lab Results  Component Value Date   HGBA1C 5.6 05/12/2022      Assessment & Plan:   Problem List Items Addressed This Visit       Cardiovascular and Mediastinum   Migraine   Overall well-controlled On Propranolol  for ppx, takes it 20 mg QD Tylenol  PRN  Relevant Medications   olmesartan -hydrochlorothiazide (BENICAR  HCT) 20-12.5 MG tablet   atorvastatin  (LIPITOR) 20 MG tablet   propranolol  (INDERAL ) 20 MG tablet   Primary hypertension   BP Readings from Last 1 Encounters:  05/03/23 124/79   Well-controlled with Olmesartan -HCTZ 20-12.5 mg QD Counseled for compliance with the medications Advised DASH diet and moderate exercise/walking, at least 150 mins/week      Relevant Medications   olmesartan -hydrochlorothiazide (BENICAR  HCT) 20-12.5 MG tablet   atorvastatin   (LIPITOR) 20 MG tablet   propranolol  (INDERAL ) 20 MG tablet   Other Relevant Orders   TSH   CMP14+EGFR   CBC with Differential/Platelet     Respiratory   Obstructive sleep apnea syndrome   STOP-BANG: 6 Very high risk for OSA Had ordered home sleep test, but he did not get it done - but agrees to do it now Needs to continue to work on losing weight Side sleeping positions for now      Relevant Orders   Home sleep test     Nervous and Auditory   Chronic suppurative otitis media of both ears   Left ear discharge, persistent despite antibiotic Has h/o recurrent otitis media Has completed ofloxacin  otic drops and Doxycycline , later Azithromycin  Due to persistent symptoms, has been referred to ENT specialist        Musculoskeletal and Integument   Skin tag   Reports pain/irritation due to skin tag Referred to Dermatology - can also evaluate the cyst over neck      Relevant Orders   Ambulatory referral to Dermatology     Other   Bipolar disorder (HCC)   Well-controlled Used to be followed by [facility] Was on Abilify , BuSpar  and Xanax as needed, needs to discuss with Psychiatry for maintenance treatment Continue Atarax  as needed for anxiety/insomnia      Mixed hyperlipidemia   Was On Gemfibrozil, now on Atorvastatin  Check lipid profile      Relevant Medications   olmesartan -hydrochlorothiazide (BENICAR  HCT) 20-12.5 MG tablet   atorvastatin  (LIPITOR) 20 MG tablet   propranolol  (INDERAL ) 20 MG tablet   Other Relevant Orders   Lipid panel   Tobacco abuse   Smokes 1 pack/day, has stopped vaping now  Asked about quitting: confirms that he currently smokes cigarettes Advise to quit smoking: Educated about QUITTING to reduce the risk of cancer, cardio and cerebrovascular disease. Assess willingness: Unwilling to quit at this time, but is working on cutting back. Assist with counseling and pharmacotherapy: Counseled for 5 minutes and literature  provided. Arrange for follow up: follow up in 3 months and continue to offer help.      Encounter for general adult medical examination with abnormal findings - Primary   Physical exam as documented. Fasting blood tests ordered today.      Prediabetes   Lab Results  Component Value Date   HGBA1C 5.6 05/12/2022   Advised to follow low carb diet      Relevant Orders   Hemoglobin A1c   CMP14+EGFR   Other Visit Diagnoses       Vitamin D  deficiency       Relevant Orders   VITAMIN D  25 Hydroxy (Vit-D Deficiency, Fractures)         Meds ordered this encounter  Medications   olmesartan -hydrochlorothiazide (BENICAR  HCT) 20-12.5 MG tablet    Sig: Take 1 tablet by mouth daily.    Dispense:  90 tablet    Refill:  3   atorvastatin  (LIPITOR) 20 MG tablet  Sig: Take 1 tablet (20 mg total) by mouth daily.    Dispense:  90 tablet    Refill:  3   propranolol  (INDERAL ) 20 MG tablet    Sig: Take 1 tablet (20 mg total) by mouth 2 (two) times daily.    Dispense:  60 tablet    Refill:  5    Follow-up: Return in about 6 months (around 11/24/2023) for HTN and HLD.    Meldon Sport, MD

## 2023-05-24 NOTE — Assessment & Plan Note (Signed)
 Lab Results  Component Value Date   HGBA1C 5.6 05/12/2022   Advised to follow low carb diet

## 2023-05-24 NOTE — Assessment & Plan Note (Addendum)
 Left ear discharge, persistent despite antibiotic Has h/o recurrent otitis media Has completed ofloxacin  otic drops and Doxycycline , later Azithromycin  Due to persistent symptoms, has been referred to ENT specialist

## 2023-05-24 NOTE — Assessment & Plan Note (Signed)
 STOP-BANG: 6 Very high risk for OSA Had ordered home sleep test, but he did not get it done - but agrees to do it now Needs to continue to work on losing weight Side sleeping positions for now

## 2023-05-24 NOTE — Assessment & Plan Note (Signed)
 Well-controlled Used to be followed by [facility] Was on Abilify, BuSpar and Xanax as needed, needs to discuss with Psychiatry for maintenance treatment Continue Atarax as needed for anxiety/insomnia

## 2023-05-24 NOTE — Assessment & Plan Note (Signed)
 Reports pain/irritation due to skin tag Referred to Dermatology - can also evaluate the cyst over neck

## 2023-05-24 NOTE — Assessment & Plan Note (Signed)
 Was On Gemfibrozil, now on Atorvastatin  Check lipid profile

## 2023-06-15 ENCOUNTER — Telehealth: Payer: Self-pay | Admitting: Internal Medicine

## 2023-06-15 NOTE — Telephone Encounter (Signed)
 CAP/DA forms  Noted Copied Sleeved   Original form placed in pcp box  Copy at front desk in folder  To call patient when forms are ready at [phone] or [phone]

## 2023-07-04 ENCOUNTER — Telehealth: Payer: Self-pay | Admitting: Internal Medicine

## 2023-07-04 DIAGNOSIS — J209 Acute bronchitis, unspecified: Secondary | ICD-10-CM

## 2023-07-04 DIAGNOSIS — E782 Mixed hyperlipidemia: Secondary | ICD-10-CM

## 2023-07-04 DIAGNOSIS — G43809 Other migraine, not intractable, without status migrainosus: Secondary | ICD-10-CM

## 2023-07-04 DIAGNOSIS — K219 Gastro-esophageal reflux disease without esophagitis: Secondary | ICD-10-CM

## 2023-07-04 DIAGNOSIS — I10 Essential (primary) hypertension: Secondary | ICD-10-CM

## 2023-07-04 MED ORDER — ATORVASTATIN CALCIUM 20 MG PO TABS
20.0000 mg | ORAL_TABLET | Freq: Every day | ORAL | 3 refills | Status: DC
Start: 2023-07-04 — End: 2023-11-21

## 2023-07-04 MED ORDER — FLUTICASONE PROPIONATE 50 MCG/ACT NA SUSP: NASAL | 1 refills | Status: DC

## 2023-07-04 MED ORDER — OMEPRAZOLE 40 MG PO CPDR: 40.0000 mg | DELAYED_RELEASE_CAPSULE | Freq: Every day | ORAL | 0 refills | Status: DC

## 2023-07-04 MED ORDER — ALBUTEROL SULFATE HFA 108 (90 BASE) MCG/ACT IN AERS
2.0000 | INHALATION_SPRAY | Freq: Four times a day (QID) | RESPIRATORY_TRACT | 0 refills | Status: AC | PRN
Start: 2023-07-04 — End: ?

## 2023-07-04 MED ORDER — OLMESARTAN MEDOXOMIL-HCTZ 20-12.5 MG PO TABS: 1.0000 | ORAL_TABLET | Freq: Every day | ORAL | 3 refills | Status: DC

## 2023-07-04 MED ORDER — PROPRANOLOL HCL 20 MG PO TABS: 20.0000 mg | ORAL_TABLET | Freq: Two times a day (BID) | ORAL | 5 refills | Status: DC

## 2023-07-04 NOTE — Telephone Encounter (Signed)
 Copied from CRM [id number]. Topic: Clinical - Medication Refill >> Jul 04, 2023  1:12 PM Thersia Flax C wrote: Medication:  omeprazole  (PRILOSEC) 40 MG capsule atorvastatin  (LIPITOR) 20 MG tablet albuterol  (VENTOLIN  HFA) 108 (90 Base) MCG/ACT inhaler fluticasone  (FLONASE ) 50 MCG/ACT nasal spray olmesartan -hydrochlorothiazide (BENICAR  HCT) 20-12.5 MG tablet  propranolol  (INDERAL ) 20 MG tablet   Has the patient contacted their pharmacy? No  This is the patient's preferred pharmacy:  [facility] [address] [place] - [address] [place] [address] Phone: [phone] Fax: [phone]  Is this the correct pharmacy for this prescription? Yes If no, delete pharmacy and type the correct one.   Has the prescription been filled recently? Yes  Is the patient out of the medication? Yes  Has the patient been seen for an appointment in the last year OR does the patient have an upcoming appointment? Yes  Can we respond through MyChart? No  Agent: Please be advised that Rx refills may take up to 3 business days. We ask that you follow-up with your pharmacy.

## 2023-07-13 ENCOUNTER — Ambulatory Visit (INDEPENDENT_AMBULATORY_CARE_PROVIDER_SITE_OTHER): Payer: Medicare (Managed Care) | Admitting: Audiology

## 2023-07-13 ENCOUNTER — Encounter (INDEPENDENT_AMBULATORY_CARE_PROVIDER_SITE_OTHER): Payer: Self-pay | Admitting: Otolaryngology

## 2023-07-13 ENCOUNTER — Encounter (INDEPENDENT_AMBULATORY_CARE_PROVIDER_SITE_OTHER): Payer: Self-pay

## 2023-07-13 ENCOUNTER — Ambulatory Visit (INDEPENDENT_AMBULATORY_CARE_PROVIDER_SITE_OTHER): Payer: Medicare (Managed Care) | Admitting: Otolaryngology

## 2023-07-13 VITALS — BP 102/67 | HR 78 | Ht 72.0 in | Wt 260.0 lb

## 2023-07-13 DIAGNOSIS — H60393 Other infective otitis externa, bilateral: Secondary | ICD-10-CM

## 2023-07-13 DIAGNOSIS — F17200 Nicotine dependence, unspecified, uncomplicated: Secondary | ICD-10-CM

## 2023-07-13 DIAGNOSIS — H9190 Unspecified hearing loss, unspecified ear: Secondary | ICD-10-CM

## 2023-07-13 DIAGNOSIS — H6123 Impacted cerumen, bilateral: Secondary | ICD-10-CM | POA: Diagnosis not present

## 2023-07-13 DIAGNOSIS — Z9089 Acquired absence of other organs: Secondary | ICD-10-CM

## 2023-07-13 DIAGNOSIS — F17210 Nicotine dependence, cigarettes, uncomplicated: Secondary | ICD-10-CM

## 2023-07-13 MED ORDER — CIPROFLOXACIN-DEXAMETHASONE 0.3-0.1 % OT SUSP: 4.0000 [drp] | Freq: Two times a day (BID) | OTIC | 1 refills | Status: AC

## 2023-07-13 MED ORDER — CIPROFLOXACIN HCL 500 MG PO TABS
500.0000 mg | ORAL_TABLET | Freq: Two times a day (BID) | ORAL | 0 refills | Status: AC
Start: 2023-07-13 — End: 2023-07-27

## 2023-07-13 NOTE — Patient Instructions (Signed)
 Use ciprodex drops 4 drops each ear twice daily for 2 weeks Use ciprofloxacin  antibiotic - 500mg  tablet twice daily for 2 weeks Keep ears dry - use cotton ball coated in vaseline when you shower

## 2023-07-13 NOTE — Progress Notes (Signed)
 Dear Dr. Bevely, Here is my assessment for our mutual patient, Mark Goodman. Thank you for allowing me the opportunity to care for your patient. Please do not hesitate to contact me should you have any other questions. Sincerely, Dr. Eldora Blanch  Otolaryngology Clinic Note Referring provider: Dr. Bevely HPI:  Mark Goodman is a 43 y.o. male kindly referred by Dr. Bevely for evaluation of bilateral otitis media  Initial visit (06/2023): Patient reports: long-standing history of ear problems under care of Dr. Arlana since he was a child. He reports that he had a right mastoidectomy with possible(?) ossiculoplasty when he was a child. Since then, he has had recurrent ear infections (all my life) with noted drainage (sometimes foul smelling), and ear pain. Most recent episode was in spring 2025 where he was having bilateral hearing loss with bilateral ear drainage, some left ear bloody drainage. Color of drainage is brown or yellow. He was prescribed multiple rounds of antibiotics and ofloxacin  drops without improvement. Some left ear discomfort with manipulation. Does have chronic bilateral non-pulsatile tinnitus, but without vertigo.  Some seasonal AR symptoms, uses flonase  BID as needed. Denies frequent sinus infections.  No frequent PNAs  Patient also denies barotrauma, vestibular suppressant use, ototoxic medication use Prior ear surgery: Right mastoidectomy, bilateral PE tubes (peds)  Personal or FHx of bleeding dz or anesthesia difficulty: no  GLP-1: no AP/AC: ASA 81  Tobacco: yes (1 PPD)  PMHx: HTN, HLD, Bipolar, GERD  Independent Review of Additional Tests or Records:  Mark Goodman (05/03/2023): noted ear drainage; treated prior for bilateral ear infection; tried doxycycline  and ofloxacin  with return of symptoms; Dx: OM; Rx: azithromycin , ref to ENT Mark Goodman (04/04/2023): decreased hearing, yellow drainage, ear pain, using ofloxacin  BID. H/o chronic ear  infections; Dx: Doxy, ofloxacin  CBC and CMP 05/12/2022: WBC 11, Hgb 15.5, Plt 298; BUN/Cr 12/1.06 CTH 05/21/2020: mastoids and ME well aerated but cuts thick so suboptimal - right CWU mastoidectomy likely; unable to assess ossicles well PMH/Meds/All/SocHx/FamHx/ROS:   Past Medical History:  Diagnosis Date   ADHD (attention deficit hyperactivity disorder)    Bipolar 1 disorder (HCC)    Cleft uvula    Hypertension    PTSD (post-traumatic stress disorder)      Past Surgical History:  Procedure Laterality Date   EXTERNAL EAR SURGERY      History reviewed. No pertinent family history.   Social Connections: Moderately Integrated (10/25/2022)   Social Connection and Isolation Panel    Frequency of Communication with Friends and Family: More than three times a week    Frequency of Social Gatherings with Friends and Family: More than three times a week    Attends Religious Services: More than 4 times per year    Active Member of [employer] or Organizations: No    Attends Engineer, structural: Never    Marital Status: Married      Current Outpatient Medications:    albuterol  (VENTOLIN  HFA) 108 (90 Base) MCG/ACT inhaler, Inhale 2 puffs into the lungs every 6 (six) hours as needed for wheezing or shortness of breath., Disp: 8 g, Rfl: 0   ALPRAZolam (XANAX) 1 MG tablet, Take 0.5-1 mg by mouth daily as needed., Disp: , Rfl:    aspirin  EC 81 MG tablet, Take 1 tablet (81 mg total) by mouth daily. Swallow whole., Disp: 30 tablet, Rfl: 11   atorvastatin  (LIPITOR) 20 MG tablet, Take 1 tablet (20 mg total) by mouth daily., Disp: 90 tablet, Rfl: 3  ciprofloxacin  (CIPRO ) 500 MG tablet, Take 1 tablet (500 mg total) by mouth 2 (two) times daily for 14 days., Disp: 28 tablet, Rfl: 0   ciprofloxacin -dexamethasone  (CIPRODEX) OTIC suspension, Place 4 drops into both ears 2 (two) times daily for 14 days., Disp: 7.5 mL, Rfl: 1   fluticasone  (FLONASE ) 50 MCG/ACT nasal spray, SMARTSIG:1-2 Spray(s) Both Nares  Daily PRN, Disp: 16 g, Rfl: 1   ibuprofen  (ADVIL ) 600 MG tablet, Take 1 tablet (600 mg total) by mouth every 8 (eight) hours as needed for moderate pain (pain score 4-6)., Disp: 30 tablet, Rfl: 0   olmesartan -hydrochlorothiazide (BENICAR  HCT) 20-12.5 MG tablet, Take 1 tablet by mouth daily., Disp: 90 tablet, Rfl: 3   omeprazole  (PRILOSEC) 40 MG capsule, Take 1 capsule (40 mg total) by mouth daily., Disp: 90 capsule, Rfl: 0   propranolol  (INDERAL ) 20 MG tablet, Take 1 tablet (20 mg total) by mouth 2 (two) times daily., Disp: 60 tablet, Rfl: 5   tadalafil  (CIALIS ) 20 MG tablet, Take 1 tablet (20 mg total) by mouth as needed for erectile dysfunction., Disp: 30 tablet, Rfl: 1   Physical Exam:   BP 102/67 (BP Location: Right Arm, Patient Position: Sitting, Cuff Size: Large)   Pulse 78   Ht 6' (1.829 m)   Wt 260 lb (117.9 kg)   SpO2 95%   BMI 35.26 kg/m   Salient findings:  CN II-XII intact Given history and complaints, ear microscopy was indicated and performed for evaluation with findings as below in physical exam section and in procedures; Bilateral EAC impacted with cerumen and drainage (appears purulent); some granulation deep in left EAC with bilateral thickened TM and appear myringitic for which I am unable to appreciate landmarks; does not appear to have perforations but suboptimal exam given amount of drainage; two wicks placed in each EAC and ciprodex drops applied after clearance of debris and purulence. [address] 512: right Rin[address] 512: AC > BC b/l  Right posauricular scar well healed Anterior rhinoscopy: Septum intact; bilateral inferior turbinates without significant hypertrophy No lesions of oral cavity/oropharynx No obviously palpable neck masses/lymphadenopathy/thyromegaly No respiratory distress or stridor  Seprately Identifiable Procedures:  Prior to initiating any procedures, risks/benefits/alternatives were explained to the patient and verbal consent obtained. Procedure:  Bilateral ear microscopy and cerumen removal using microscope (CPT [phone]) - Mod 25 Pre-procedure diagnosis: Cerumen impaction bilateral external ears; bilateral myringitis; bilateral hearing loss Post-procedure diagnosis: same Indication: see above; given patient's otologic complaints and history as well as for improved and comprehensive examination of external ear and tympanic membrane, bilateral otologic examination using microscope was performed and impacted cerumen removed  Procedure: Patient was placed semi-recumbent. Both ear canals were examined using the microscope with findings above. Impacted Cerumen removed on left and on right using suction and currette with improvement in EAC examination and patency. Purulence suctioned from both EACs Patient tolerated the procedure well.      Impression & Plans:  Mark Goodman is a 43 y.o. male with:  1. Chronic bacterial otitis externa, bilateral   2. H/O mastoidectomy   3. Subjective hearing loss   4. Bilateral impacted cerumen   5. Tobacco use disorder    Noted long-standing issues with ears with history of right mastoidectomy; most recent episode with chronic drainage. Granulation deep on left posteriorly (likely on TM) with bilateral myringitic appearance. We discussed need for safe, dry, and then hearing ear.  Given the patient's tobacco use, I also discussed cessation and options for cessation, including counseling. Counseled  patient on the dangers of tobacco use, advised patient to stop smoking. Patient is not ready to quit. Total time spent with this was 4 minutes.    - Will start with ciprodex BID and cipro  BID x2 weeks - Keep ears dry - f/u in 2 weeks; once ear stable, will need audio and CT   See below regarding exact medications prescribed this encounter including dosages and route: Meds ordered this encounter  Medications   ciprofloxacin -dexamethasone  (CIPRODEX) OTIC suspension    Sig: Place 4 drops into both ears 2  (two) times daily for 14 days.    Dispense:  7.5 mL    Refill:  1   ciprofloxacin  (CIPRO ) 500 MG tablet    Sig: Take 1 tablet (500 mg total) by mouth 2 (two) times daily for 14 days.    Dispense:  28 tablet    Refill:  0      Thank you for allowing me the opportunity to care for your patient. Please do not hesitate to contact me should you have any other questions.  Sincerely, Eldora Blanch, MD Otolaryngologist (ENT), [facility] Phone: [phone] Fax: [phone]  07/13/2023, 10:08 AM   MDM:  Level 4 - [phone] Complexity/Problems addressed: chronic problem with exacerbation Data complexity: high - review of notes, labs; independent interpretation of CT - Morbidity: mod  - Prescription Drug prescribed or managed: y

## 2023-07-15 ENCOUNTER — Other Ambulatory Visit: Payer: Self-pay | Admitting: Family Medicine

## 2023-07-15 DIAGNOSIS — K219 Gastro-esophageal reflux disease without esophagitis: Secondary | ICD-10-CM

## 2023-07-19 ENCOUNTER — Ambulatory Visit: Payer: Self-pay

## 2023-07-19 NOTE — Telephone Encounter (Signed)
 Called pt and was advised with verbal understanding

## 2023-07-19 NOTE — Telephone Encounter (Signed)
 FYI Only or Action Required?: Action required by provider: clinical question for provider.  Patient was last seen in primary care on 05/24/2023 by Tobie Suzzane POUR, MD. Called Nurse Triage reporting Urticaria. Symptoms began several days ago. Interventions attempted: OTC medications: benedryl. Symptoms are: gradually worsening.  Triage Disposition: See Physician Within 24 Hours  Patient/caregiver understands and will follow disposition?: No  Pt states that he will stop taking the medication. Pt believes this is d/t his abx that he just started. Please advise pt on next steps. Refused appt. Denies SOB/scratchy throat/tongue swelling.   Copied from CRM [id number]. Topic: Clinical - Red Word Triage >> Jul 19, 2023  2:45 PM Geneva B wrote: Red Word that prompted transfer to Nurse Triage: allergic reaction to rx Reason for Disposition  [1] MODERATE-SEVERE hives persist (i.e., hives interfere with normal activities or work) AND [2] taking antihistamine (e.g., Benadryl , Claritin) > 24 hours  Answer Assessment - Initial Assessment Questions 1. APPEARANCE: What does the rash look like?      Bumps, red,  2. LOCATION: Where is the rash located?      Legs, arms, genitals, lower abd 3. NUMBER: How many hives are there?      numerous 4. SIZE: How big are the hives? (inches, cm, compare to coins) Do they all look the same or is there lots of variation in shape and size?      Pea sized 5. ONSET: When did the hives begin? (Hours or days ago)      2 days ago 6. ITCHING: Does it itch? If Yes, ask: How bad is the itch?    - MILD: doesn't interfere with normal activities   - MODERATE-SEVERE: interferes with work, school, sleep, or other activities      moderate 7. RECURRENT PROBLEM: Have you had hives before? If Yes, ask: When was the last time? and What happened that time?      denies 8. TRIGGERS: Were you exposed to any new food, plant, cosmetic product or animal just before the hives  began?     Started new abx just prior to the hives 9. OTHER SYMPTOMS: Do you have any other symptoms? (e.g., fever, tongue swelling, difficulty breathing, abdomen pain)     denies  Protocols used: Hives-A-AH

## 2023-07-20 DIAGNOSIS — L299 Pruritus, unspecified: Secondary | ICD-10-CM | POA: Diagnosis not present

## 2023-07-20 DIAGNOSIS — T7840XA Allergy, unspecified, initial encounter: Secondary | ICD-10-CM | POA: Diagnosis not present

## 2023-07-20 DIAGNOSIS — L739 Follicular disorder, unspecified: Secondary | ICD-10-CM | POA: Diagnosis not present

## 2023-07-27 ENCOUNTER — Ambulatory Visit (INDEPENDENT_AMBULATORY_CARE_PROVIDER_SITE_OTHER): Payer: Medicare (Managed Care) | Admitting: Otolaryngology

## 2023-07-27 ENCOUNTER — Encounter (INDEPENDENT_AMBULATORY_CARE_PROVIDER_SITE_OTHER): Payer: Self-pay | Admitting: Otolaryngology

## 2023-07-27 VITALS — BP 122/81 | HR 61

## 2023-07-27 DIAGNOSIS — H6123 Impacted cerumen, bilateral: Secondary | ICD-10-CM | POA: Diagnosis not present

## 2023-07-27 DIAGNOSIS — H9190 Unspecified hearing loss, unspecified ear: Secondary | ICD-10-CM | POA: Diagnosis not present

## 2023-07-27 DIAGNOSIS — H7320 Unspecified myringitis, unspecified ear: Secondary | ICD-10-CM | POA: Diagnosis not present

## 2023-07-27 DIAGNOSIS — H73893 Other specified disorders of tympanic membrane, bilateral: Secondary | ICD-10-CM

## 2023-07-27 DIAGNOSIS — H6993 Unspecified Eustachian tube disorder, bilateral: Secondary | ICD-10-CM

## 2023-07-27 DIAGNOSIS — Z9089 Acquired absence of other organs: Secondary | ICD-10-CM

## 2023-07-27 NOTE — Patient Instructions (Signed)
 Continue ear drops for 2 more weeks Keep ears dry - use a cotton ball coated in vaseline when showering  I have ordered an imaging study for you to complete prior to your next visit. Please call [facility] Scheduling at [phone] to schedule your imaging if you have not received a call within 24 hours. If you are unable to complete your imaging study prior to your next scheduled visit please call our office to let us  know.

## 2023-07-27 NOTE — Progress Notes (Signed)
 Dear Dr. Tobie, Here is my assessment for our mutual patient, Mark Goodman. Thank you for allowing me the opportunity to care for your patient. Please do not hesitate to contact me should you have any other questions. Sincerely, Dr. Eldora Tobie  Otolaryngology Clinic Note Referring provider: Dr. Tobie HPI:  Mark Goodman is a 43 y.o. male kindly referred by Dr. Tobie for evaluation of bilateral otitis media  Initial visit (06/2023): Patient reports: long-standing history of ear problems under care of Dr. Arlana since he was a child. He reports that he had a right mastoidectomy with possible(?) ossiculoplasty when he was a child. Since then, he has had recurrent ear infections (all my life) with noted drainage (sometimes foul smelling), and ear pain. Most recent episode was in spring 2025 where he was having bilateral hearing loss with bilateral ear drainage, some left ear bloody drainage. Color of drainage is brown or yellow. He was prescribed multiple rounds of antibiotics and ofloxacin  drops without improvement. Some left ear discomfort with manipulation. Does have chronic bilateral non-pulsatile tinnitus, but without vertigo.  Some seasonal AR symptoms, uses flonase  BID as needed. Denies frequent sinus infections.  No frequent PNAs  Patient also denies barotrauma, vestibular suppressant use, ototoxic medication use  --------------------------------------------------------- 07/27/2023 Returns for follow up. Overall doing better, no ear drainage, pain. Ear feels full due to wick in place. He has been keeping ears dry. No vertigo. Stopped cipro  PO due to rash, still doing drops.    Prior ear surgery: Right mastoidectomy, bilateral PE tubes (peds)  Personal or FHx of bleeding dz or anesthesia difficulty: no  GLP-1: no AP/AC: ASA 81  Tobacco: yes (1 PPD)  PMHx: HTN, HLD, Bipolar, GERD  Independent Review of Additional Tests or Records:  Mark Goodman (05/03/2023): noted ear  drainage; treated prior for bilateral ear infection; tried doxycycline  and ofloxacin  with return of symptoms; Dx: OM; Rx: azithromycin , ref to ENT Mark Goodman (04/04/2023): decreased hearing, yellow drainage, ear pain, using ofloxacin  BID. H/o chronic ear infections; Dx: Doxy, ofloxacin  CBC and CMP 05/12/2022: WBC 11, Hgb 15.5, Plt 298; BUN/Cr 12/1.06 CTH 05/21/2020: mastoids and ME well aerated but cuts thick so suboptimal - right CWU mastoidectomy likely; unable to assess ossicles well PMH/Meds/All/SocHx/FamHx/ROS:   Past Medical History:  Diagnosis Date   ADHD (attention deficit hyperactivity disorder)    Bipolar 1 disorder (HCC)    Cleft uvula    Hypertension    PTSD (post-traumatic stress disorder)      Past Surgical History:  Procedure Laterality Date   EXTERNAL EAR SURGERY      No family history on file.   Social Connections: Moderately Integrated (10/25/2022)   Social Connection and Isolation Panel    Frequency of Communication with Friends and Family: More than three times a week    Frequency of Social Gatherings with Friends and Family: More than three times a week    Attends Religious Services: More than 4 times per year    Active Member of [employer] or Organizations: No    Attends Engineer, structural: Never    Marital Status: Married      Current Outpatient Medications:    albuterol  (VENTOLIN  HFA) 108 (90 Base) MCG/ACT inhaler, Inhale 2 puffs into the lungs every 6 (six) hours as needed for wheezing or shortness of breath., Disp: 8 g, Rfl: 0   ALPRAZolam (XANAX) 1 MG tablet, Take 0.5-1 mg by mouth daily as needed., Disp: , Rfl:    aspirin  EC 81  MG tablet, Take 1 tablet (81 mg total) by mouth daily. Swallow whole., Disp: 30 tablet, Rfl: 11   atorvastatin  (LIPITOR) 20 MG tablet, Take 1 tablet (20 mg total) by mouth daily., Disp: 90 tablet, Rfl: 3   ciprofloxacin  (CIPRO ) 500 MG tablet, Take 1 tablet (500 mg total) by mouth 2 (two) times daily for 14 days., Disp: 28  tablet, Rfl: 0   ciprofloxacin -dexamethasone  (CIPRODEX ) OTIC suspension, Place 4 drops into both ears 2 (two) times daily for 14 days., Disp: 7.5 mL, Rfl: 1   fluticasone  (FLONASE ) 50 MCG/ACT nasal spray, SMARTSIG:1-2 Spray(s) Both Nares Daily PRN, Disp: 16 g, Rfl: 1   ibuprofen  (ADVIL ) 600 MG tablet, Take 1 tablet (600 mg total) by mouth every 8 (eight) hours as needed for moderate pain (pain score 4-6)., Disp: 30 tablet, Rfl: 0   olmesartan -hydrochlorothiazide (BENICAR  HCT) 20-12.5 MG tablet, Take 1 tablet by mouth daily., Disp: 90 tablet, Rfl: 3   omeprazole  (PRILOSEC) 40 MG capsule, Take 1 capsule (40 mg total) by mouth daily., Disp: 90 capsule, Rfl: 0   propranolol  (INDERAL ) 20 MG tablet, Take 1 tablet (20 mg total) by mouth 2 (two) times daily., Disp: 60 tablet, Rfl: 5   tadalafil  (CIALIS ) 20 MG tablet, Take 1 tablet (20 mg total) by mouth as needed for erectile dysfunction., Disp: 30 tablet, Rfl: 1   Physical Exam:   BP 122/81   Pulse 61   SpO2 95%   Salient findings:  CN II-XII intact Given history and complaints, ear microscopy was indicated and performed for evaluation with findings as below in physical exam section and in procedures; Ear wicks removed from each ear; bilateral ears impacted with debris which was again cleaned; no granulation in EAC; b/l TM thickened with right likely postsurgical changes with retraction posteriorly (significant) but no obvious perforation noted; on left, global retraction with prominent malleus (but looks thicker) without obvious perforation or keratin debris Weber 512: right Rinne 512: AC > BC b/l  Right posauricular scar well healed Anterior rhinoscopy: Septum intact; bilateral inferior turbinates without significant hypertrophy No lesions of oral cavity/oropharynx No obviously palpable neck masses/lymphadenopathy/thyromegaly No respiratory distress or stridor  Seprately Identifiable Procedures:  Prior to initiating any procedures,  risks/benefits/alternatives were explained to the patient and verbal consent obtained. Procedure: Bilateral ear microscopy and cerumen removal using microscope (CPT [id number]) - Mod 25 Pre-procedure diagnosis: Cerumen and debris impaction bilateral external ears; bilateral myringitis and tympanic membrane retraction; bilateral hearing loss Post-procedure diagnosis: same Indication: see above; given patient's otologic complaints and history as well as for improved and comprehensive examination of external ear and tympanic membrane, bilateral otologic examination using microscope was performed and impacted cerumen removed  Procedure: Patient was placed semi-recumbent. Both ear canals were examined using the microscope with findings above. Impacted ceruminous debris removed on left and on right using suction and currette with improvement in EAC examination and patency. Patient tolerated the procedure well.      Impression & Plans:  Mark Goodman is a 43 y.o. male with:  1. Tympanic membrane retraction, bilateral   2. Myringitis   3. Dysfunction of both eustachian tubes   4. H/O mastoidectomy   5. Subjective hearing loss    Noted long-standing issues with ears with history of right mastoidectomy; most recent episode with chronic drainage. Granulation has resolved and ears look better but still noted significant retraction b/l; I do wonder if there is cholesteatoma on right. We discussed need for safe, dry, and then hearing ear.  -  Will continue with ciprodex  BID x2 weeks - Keep ears dry - CT temporal bones  - f/u in 6-8 weeks, sooner as necessary  Thank you for allowing me the opportunity to care for your patient. Please do not hesitate to contact me should you have any other questions.  Sincerely, Eldora Blanch, MD Otolaryngologist (ENT), [facility] Phone: [phone] Fax: [phone]  07/27/2023, 2:26 PM   I have personally spent 31 minutes involved in  face-to-face and non-face-to-face activities for this patient on the day of the visit.  Professional time spent excludes any procedures performed but includes the following activities, in addition to those noted in the documentation: preparing to see the patient (review of outside documentation and results), performing a medically appropriate examination, counseling, ordering tests, documenting in the electronic health record

## 2023-08-09 ENCOUNTER — Ambulatory Visit (HOSPITAL_COMMUNITY)
Admission: RE | Admit: 2023-08-09 | Discharge: 2023-08-09 | Disposition: A | Discharge status: Home / Self Care | Source: Ambulatory Visit | Attending: Otolaryngology | Admitting: Otolaryngology

## 2023-08-09 DIAGNOSIS — Z9089 Acquired absence of other organs: Secondary | ICD-10-CM | POA: Diagnosis not present

## 2023-08-09 DIAGNOSIS — H9190 Unspecified hearing loss, unspecified ear: Secondary | ICD-10-CM | POA: Insufficient documentation

## 2023-08-09 DIAGNOSIS — H73893 Other specified disorders of tympanic membrane, bilateral: Secondary | ICD-10-CM | POA: Diagnosis not present

## 2023-08-09 DIAGNOSIS — H6993 Unspecified Eustachian tube disorder, bilateral: Secondary | ICD-10-CM | POA: Insufficient documentation

## 2023-08-09 DIAGNOSIS — H7011 Chronic mastoiditis, right ear: Secondary | ICD-10-CM | POA: Diagnosis not present

## 2023-09-07 ENCOUNTER — Ambulatory Visit (INDEPENDENT_AMBULATORY_CARE_PROVIDER_SITE_OTHER): Admitting: Otolaryngology

## 2023-09-12 ENCOUNTER — Other Ambulatory Visit: Payer: Self-pay | Admitting: Internal Medicine

## 2023-09-12 DIAGNOSIS — K219 Gastro-esophageal reflux disease without esophagitis: Secondary | ICD-10-CM

## 2023-09-12 MED ORDER — OMEPRAZOLE 40 MG PO CPDR: 40.0000 mg | DELAYED_RELEASE_CAPSULE | Freq: Every day | ORAL | 0 refills | Status: DC

## 2023-09-12 NOTE — Telephone Encounter (Signed)
 Copied from CRM #[id number]. Topic: Clinical - Medication Refill >> Sep 12, 2023 10:57 AM Jakyia R wrote: Medication:  omeprazole  (PRILOSEC) 40 MG capsule   Has the patient contacted their pharmacy? Yes no more refills  (Agent: If no, request that the patient contact the pharmacy for the refill. If patient does not wish to contact the pharmacy document the reason why and proceed with request.) (Agent: If yes, when and what did the pharmacy advise?)  This is the patient's preferred pharmacy:  [facility] [address] [place] - [address] [place] [address] Phone: [phone] Fax: [phone]  Is this the correct pharmacy for this prescription? Yes If no, delete pharmacy and type the correct one.   Has the prescription been filled recently? Yes June   Is the patient out of the medication? Yes  Has the patient been seen for an appointment in the last year OR does the patient have an upcoming appointment? Yes  Can we respond through MyChart? No  Agent: Please be advised that Rx refills may take up to 3 business days. We ask that you follow-up with your pharmacy.

## 2023-09-13 ENCOUNTER — Ambulatory Visit (INDEPENDENT_AMBULATORY_CARE_PROVIDER_SITE_OTHER): Admitting: Audiology

## 2023-09-13 ENCOUNTER — Encounter (INDEPENDENT_AMBULATORY_CARE_PROVIDER_SITE_OTHER): Payer: Self-pay | Admitting: Otolaryngology

## 2023-09-13 ENCOUNTER — Ambulatory Visit (INDEPENDENT_AMBULATORY_CARE_PROVIDER_SITE_OTHER): Admitting: Otolaryngology

## 2023-09-13 VITALS — BP 116/78 | HR 63 | Ht 72.0 in | Wt 260.0 lb

## 2023-09-13 DIAGNOSIS — F17210 Nicotine dependence, cigarettes, uncomplicated: Secondary | ICD-10-CM | POA: Diagnosis not present

## 2023-09-13 DIAGNOSIS — H6121 Impacted cerumen, right ear: Secondary | ICD-10-CM

## 2023-09-13 DIAGNOSIS — H900 Conductive hearing loss, bilateral: Secondary | ICD-10-CM

## 2023-09-13 DIAGNOSIS — H7191 Unspecified cholesteatoma, right ear: Secondary | ICD-10-CM

## 2023-09-13 DIAGNOSIS — Z9089 Acquired absence of other organs: Secondary | ICD-10-CM

## 2023-09-13 DIAGNOSIS — H73893 Other specified disorders of tympanic membrane, bilateral: Secondary | ICD-10-CM

## 2023-09-13 DIAGNOSIS — H7320 Unspecified myringitis, unspecified ear: Secondary | ICD-10-CM | POA: Diagnosis not present

## 2023-09-13 DIAGNOSIS — F17200 Nicotine dependence, unspecified, uncomplicated: Secondary | ICD-10-CM

## 2023-09-13 DIAGNOSIS — H6993 Unspecified Eustachian tube disorder, bilateral: Secondary | ICD-10-CM

## 2023-09-13 MED ORDER — CIPROFLOXACIN-DEXAMETHASONE 0.3-0.1 % OT SUSP: 4.0000 [drp] | Freq: Two times a day (BID) | OTIC | 0 refills | Status: AC

## 2023-09-13 NOTE — Progress Notes (Signed)
 Dear Dr. Tobie, Here is my assessment for our mutual patient, Mark Goodman. Thank you for allowing me the opportunity to care for your patient. Please do not hesitate to contact me should you have any other questions. Sincerely, Dr. Eldora Tobie  Otolaryngology Clinic Note Referring provider: Dr. Tobie HPI:  Mark Goodman is a 43 y.o. male kindly referred by Dr. Tobie for evaluation of bilateral otitis media  Initial visit (06/2023): Patient reports: long-standing history of ear problems under care of Dr. Arlana since he was a child. He reports that he had a right mastoidectomy with possible(?) ossiculoplasty when he was a child. Since then, he has had recurrent ear infections (all my life) with noted drainage (sometimes foul smelling), and ear pain. Most recent episode was in spring 2025 where he was having bilateral hearing loss with bilateral ear drainage, some left ear bloody drainage. Color of drainage is brown or yellow. He was prescribed multiple rounds of antibiotics and ofloxacin  drops without improvement. Some left ear discomfort with manipulation. Does have chronic bilateral non-pulsatile tinnitus, but without vertigo.  Some seasonal AR symptoms, uses flonase  BID as needed. Denies frequent sinus infections.  No frequent PNAs  Patient also denies barotrauma, vestibular suppressant use, ototoxic medication use  --------------------------------------------------------- 07/27/2023 Returns for follow up. Overall doing better, no ear drainage, pain. Ear feels full due to wick in place. He has been keeping ears dry. No vertigo. Stopped cipro  PO due to rash, still doing drops.   --------------------------------------------------------- 09/13/2023 Continues to do better. No ear drainage or pain. Ear without fullness. Has been keeping ears dry without vertigo. He reiterates that he has had episodic right ear drainage all my life. We did discuss his CT and audiogram    Prior ear  surgery: Right mastoidectomy, bilateral PE tubes (peds)  Personal or FHx of bleeding dz or anesthesia difficulty: no  GLP-1: no AP/AC: ASA 81  Tobacco: yes (1 PPD)  PMHx: HTN, HLD, Bipolar, GERD  Independent Review of Additional Tests or Records:  Leita Longs (05/03/2023): noted ear drainage; treated prior for bilateral ear infection; tried doxycycline  and ofloxacin  with return of symptoms; Dx: OM; Rx: azithromycin , ref to ENT Gloria Zarwolo (04/04/2023): decreased hearing, yellow drainage, ear pain, using ofloxacin  BID. H/o chronic ear infections; Dx: Doxy, ofloxacin  CBC and CMP 05/12/2022: WBC 11, Hgb 15.5, Plt 298; BUN/Cr 12/1.06 CTH 05/21/2020: mastoids and ME well aerated but cuts thick so suboptimal - right CWU mastoidectomy likely; unable to assess ossicles well CT Termporal bones 08/09/2023 independently interpreted: left temporal bone with sclerotic mastoid, no erosion of scutum; retracted TM with aerated ME, likely myringoincudostapediopexy; on right, appears to have had prior mastoidectomy with partial canal wall down(?) v/s erosion with debris in mastoid cavity; ossicles intact but query perforation and question cholesteatoma v/s debris around oval niche 08/2023 Audiogram was independently reviewed and interpreted by me and it reveals - mild b/l CHL AU with WRT 100% at 80dB AU; ?/B tymps SNHL= Sensorineural hearing loss  PMH/Meds/All/SocHx/FamHx/ROS:   Past Medical History:  Diagnosis Date   ADHD (attention deficit hyperactivity disorder)    Bipolar 1 disorder (HCC)    Cleft uvula    Hypertension    PTSD (post-traumatic stress disorder)      Past Surgical History:  Procedure Laterality Date   EXTERNAL EAR SURGERY      History reviewed. No pertinent family history.   Social Connections: Moderately Integrated (10/25/2022)   Social Connection and Isolation Panel    Frequency of Communication  with Friends and Family: More than three times a week    Frequency of Social  Gatherings with Friends and Family: More than three times a week    Attends Religious Services: More than 4 times per year    Active Member of [employer] or Organizations: No    Attends Engineer, structural: Never    Marital Status: Married      Current Outpatient Medications:    albuterol  (VENTOLIN  HFA) 108 (90 Base) MCG/ACT inhaler, Inhale 2 puffs into the lungs every 6 (six) hours as needed for wheezing or shortness of breath., Disp: 8 g, Rfl: 0   ALPRAZolam (XANAX) 1 MG tablet, Take 0.5-1 mg by mouth daily as needed., Disp: , Rfl:    aspirin  EC 81 MG tablet, Take 1 tablet (81 mg total) by mouth daily. Swallow whole., Disp: 30 tablet, Rfl: 11   atorvastatin  (LIPITOR) 20 MG tablet, Take 1 tablet (20 mg total) by mouth daily., Disp: 90 tablet, Rfl: 3   ciprofloxacin -dexamethasone  (CIPRODEX ) OTIC suspension, Place 4 drops into the left ear 2 (two) times daily for 14 days., Disp: 7.5 mL, Rfl: 0   fluticasone  (FLONASE ) 50 MCG/ACT nasal spray, SMARTSIG:1-2 Spray(s) Both Nares Daily PRN, Disp: 16 g, Rfl: 1   ibuprofen  (ADVIL ) 600 MG tablet, Take 1 tablet (600 mg total) by mouth every 8 (eight) hours as needed for moderate pain (pain score 4-6)., Disp: 30 tablet, Rfl: 0   olmesartan -hydrochlorothiazide (BENICAR  HCT) 20-12.5 MG tablet, Take 1 tablet by mouth daily., Disp: 90 tablet, Rfl: 3   omeprazole  (PRILOSEC) 40 MG capsule, Take 1 capsule (40 mg total) by mouth daily., Disp: 90 capsule, Rfl: 0   propranolol  (INDERAL ) 20 MG tablet, Take 1 tablet (20 mg total) by mouth 2 (two) times daily., Disp: 60 tablet, Rfl: 5   tadalafil  (CIALIS ) 20 MG tablet, Take 1 tablet (20 mg total) by mouth as needed for erectile dysfunction., Disp: 30 tablet, Rfl: 1   Physical Exam:   BP 116/78 (BP Location: Left Arm, Patient Position: Sitting, Cuff Size: Large)   Pulse 63   Ht 6' (1.829 m)   Wt 260 lb (117.9 kg)   SpO2 96%   BMI 35.26 kg/m   Salient findings:  CN II-XII intact Given history and  complaints, ear microscopy was indicated and performed for evaluation with findings as below in physical exam section and in procedures; right ears impacted with ceruminous debris which was again cleaned; no granulation in EAC; b/l TM thickened with right likely postsurgical changes with retraction posteriorly (significant) with question of canal wall erosion but no obvious perforation noted; on left, global retraction with prominent malleus and myringoincudostapediopexy without obvious perforation or keratin debris Weber 512: right Rinne 512: AC > BC b/l  Right posauricular scar well healed Anterior rhinoscopy: Septum intact; bilateral inferior turbinates without significant hypertrophy No respiratory distress or stridor  Seprately Identifiable Procedures:  Prior to initiating any procedures, risks/benefits/alternatives were explained to the patient and verbal consent obtained. Procedure: Bilateral ear microscopy and cerumen removal using microscope (CPT [id number]) - Mod 25 Pre-procedure diagnosis: Cerumen and debris impaction right external ears; bilateral tympanic membrane retraction; bilateral hearing loss Post-procedure diagnosis: same Indication: see above; given patient's otologic complaints and history as well as for improved and comprehensive examination of external ear and tympanic membrane, bilateral otologic examination using microscope was performed and impacted cerumen removed  Procedure: Patient was placed semi-recumbent. Both ear canals were examined using the microscope with findings above. Impacted ceruminous debris  removed on right using suction and currette with improvement in EAC examination and patency. Patient tolerated the procedure well.      Impression & Plans:  Mavric Cortright is a 42 y.o. male with:  1. Tympanic membrane retraction, bilateral   2. Cholesteatoma of right ear   3. H/O mastoidectomy   4. Conductive hearing loss, bilateral   5. Myringitis   6.  Dysfunction of both eustachian tubes   7. Impacted cerumen of right ear   8. Tobacco use disorder    Noted long-standing issues with ears especially on right with history of right mastoidectomy; most recent episode with chronic drainage. Granulation has resolved and ears look better but still noted significant retraction b/l; on right, CT and exam worrisome for possible cholesteatoma and he continues to have right ear drainage especially with water exposure. We discussed need for safe, dry, and then hearing ear.  Given his CT findings and symptoms, I did offer him a CWD mastoidectomy to exteriorize possible disease and give him a safe dry ear given he already has a posterior canal wall defect. He will think about this  - Will continue with ciprodex  BID PRN if re-drainage - Keep ears dry - f/u in 4 weeks, sooner as needed  Thank you for allowing me the opportunity to care for your patient. Please do not hesitate to contact me should you have any other questions.  Sincerely, Eldora Blanch, MD Otolaryngologist (ENT), [facility] Phone: [phone] Fax: [phone]  09/13/2023, 12:36 PM   MDM:  Level 4: 99204 Complexity/Problems addressed: mod - multiple chronic problems Data complexity: mod - independent CT interpretation - Morbidity: low  - Prescription Drug prescribed or managed: no

## 2023-09-13 NOTE — Progress Notes (Signed)
  [address], [address] [place], [place] [address] [phone]  Audiological Evaluation    Name: Mark Goodman     DOB:   10-Jan-1981      MRN:   [id number]                                                                                     Service Date: 09/13/2023     Accompanied by: wife   Patient comes today after Dr. Tobie, ENT sent a referral for a hearing evaluation due to concerns with hearing loss.   Symptoms Yes Details  Hearing loss  [x]  Both ears  Tinnitus  [x]  Both ears  Ear pain/ infections/pressure  [x]  Recurrent - per patient since he was a child. Both ears feel full. When he does Valsalva the right side makes a sound that I was able to hear.  Balance problems  []    Noise exposure history  []    Previous ear surgeries  [x]  Reports ear surgeries, most of them in the right ear.  Family history of hearing loss  []    Amplification  []    Other  []      Otoscopy: Right ear: Abnormal eardrum appearance. Left ear:  Abnormal eardrum appearance.  Tympanometry: Right ear: Unable to replicate test results, several peaks observed during testing window. Asked patient to hold his breath with no improvement.. Left ear: Type B- Normal external ear canal volume with no middle ear pressure peak or tympanic membrane compliance.    Pure tone Audiometry: Right ear- Mild conductive hearing loss from 125 Hz - 8000 Hz. Left ear-  Mild to moderate conductive hearing loss from 125 Hz - 8000 Hz.  Speech Audiometry: Right ear- Speech Reception Threshold (SRT) was obtained at 40 dBHL. Left ear-Speech Reception Threshold (SRT) was obtained at 40 dBHL.   Word Recognition Score Tested using NU-6 (recorded) Right ear: 100% was obtained at a presentation level of 80 dBHL with contralateral masking which is deemed as  excellent. Left ear: 100% was obtained at a presentation level of 80 dBHL with contralateral masking which is deemed as  excellent.   The hearing test results were  completed under headphones and results are deemed to be of good reliability. Test technique:  conventional    Recommendations: Follow up with ENT as scheduled for today. Return for a hearing evaluation in conjunction with medical care, before if concerns with hearing changes arise or per MD recommendation. Recommend amplification after medical clearance and care.   Haitham Dolinsky MARIE LEROUX-MARTINEZ, AUD

## 2023-09-22 ENCOUNTER — Encounter: Payer: Self-pay | Admitting: Audiology

## 2023-09-28 ENCOUNTER — Other Ambulatory Visit: Payer: Self-pay | Admitting: Internal Medicine

## 2023-09-28 DIAGNOSIS — K219 Gastro-esophageal reflux disease without esophagitis: Secondary | ICD-10-CM

## 2023-10-03 ENCOUNTER — Emergency Department (HOSPITAL_COMMUNITY)

## 2023-10-03 ENCOUNTER — Emergency Department (HOSPITAL_COMMUNITY)
Admission: EM | Admit: 2023-10-03 | Discharge: 2023-10-03 | Disposition: A | Discharge status: Home / Self Care | Attending: Emergency Medicine | Admitting: Emergency Medicine

## 2023-10-03 ENCOUNTER — Other Ambulatory Visit: Payer: Self-pay

## 2023-10-03 ENCOUNTER — Encounter (HOSPITAL_COMMUNITY): Payer: Self-pay | Admitting: Emergency Medicine

## 2023-10-03 DIAGNOSIS — J029 Acute pharyngitis, unspecified: Secondary | ICD-10-CM | POA: Insufficient documentation

## 2023-10-03 DIAGNOSIS — R1310 Dysphagia, unspecified: Secondary | ICD-10-CM | POA: Insufficient documentation

## 2023-10-03 DIAGNOSIS — I10 Essential (primary) hypertension: Secondary | ICD-10-CM | POA: Insufficient documentation

## 2023-10-03 DIAGNOSIS — D72829 Elevated white blood cell count, unspecified: Secondary | ICD-10-CM | POA: Diagnosis not present

## 2023-10-03 DIAGNOSIS — J384 Edema of larynx: Secondary | ICD-10-CM | POA: Diagnosis not present

## 2023-10-03 DIAGNOSIS — Z7982 Long term (current) use of aspirin: Secondary | ICD-10-CM | POA: Diagnosis not present

## 2023-10-03 DIAGNOSIS — R079 Chest pain, unspecified: Secondary | ICD-10-CM | POA: Diagnosis not present

## 2023-10-03 DIAGNOSIS — Z79899 Other long term (current) drug therapy: Secondary | ICD-10-CM | POA: Diagnosis not present

## 2023-10-03 DIAGNOSIS — R0789 Other chest pain: Secondary | ICD-10-CM | POA: Diagnosis not present

## 2023-10-03 LAB — COMPREHENSIVE METABOLIC PANEL WITH GFR
ALT: 18 U/L (ref 0–44)
AST: 17 U/L (ref 15–41)
Albumin: 3.6 g/dL (ref 3.5–5.0)
Alkaline Phosphatase: 122 U/L (ref 38–126)
Anion gap: 12 (ref 5–15)
BUN: 14 mg/dL (ref 6–20)
CO2: 25 mmol/L (ref 22–32)
Calcium: 8.9 mg/dL (ref 8.9–10.3)
Chloride: 99 mmol/L (ref 98–111)
Creatinine, Ser: 1.11 mg/dL (ref 0.61–1.24)
GFR, Estimated: >60 mL/min (ref >60)
Glucose, Bld: 94 mg/dL (ref 70–99)
Potassium: 3.7 mmol/L (ref 3.5–5.1)
Sodium: 136 mmol/L (ref 135–145)
Total Bilirubin: 1 mg/dL (ref 0.0–1.2)
Total Protein: 7.2 g/dL (ref 6.5–8.1)

## 2023-10-03 LAB — LIPASE, BLOOD: Lipase: 39 U/L (ref 11–51)

## 2023-10-03 LAB — GROUP A STREP BY PCR: Group A Strep by PCR: NOT DETECTED

## 2023-10-03 LAB — CBC WITH DIFFERENTIAL/PLATELET
Abs Immature Granulocytes: 0.1 K/uL — ABNORMAL HIGH (ref 0.00–0.07)
Basophils Absolute: 0.1 K/uL (ref 0.0–0.1)
Basophils Relative: 0 %
Eosinophils Absolute: 0.6 K/uL — ABNORMAL HIGH (ref 0.0–0.5)
Eosinophils Relative: 3 %
HCT: 39.4 % (ref 39.0–52.0)
Hemoglobin: 13.5 g/dL (ref 13.0–17.0)
Immature Granulocytes: 1 %
Lymphocytes Relative: 16 %
Lymphs Abs: 3 K/uL (ref 0.7–4.0)
MCH: 32.1 pg (ref 26.0–34.0)
MCHC: 34.3 g/dL (ref 30.0–36.0)
MCV: 93.6 fL (ref 80.0–100.0)
Monocytes Absolute: 1 K/uL (ref 0.1–1.0)
Monocytes Relative: 6 %
Neutro Abs: 13.5 K/uL — ABNORMAL HIGH (ref 1.7–7.7)
Neutrophils Relative %: 74 %
Platelets: 300 K/uL (ref 150–400)
RBC: 4.21 MIL/uL — ABNORMAL LOW (ref 4.22–5.81)
RDW: 13 % (ref 11.5–15.5)
WBC: 18.1 K/uL — ABNORMAL HIGH (ref 4.0–10.5)
nRBC: 0 % (ref 0.0–0.2)

## 2023-10-03 LAB — TROPONIN I (HIGH SENSITIVITY)
Troponin I (High Sensitivity): 3 ng/L (ref <18)
Troponin I (High Sensitivity): 3 ng/L (ref <18)

## 2023-10-03 MED ORDER — ALUM & MAG HYDROXIDE-SIMETH 200-200-20 MG/5ML PO SUSP
30.0000 mL | Freq: Once | ORAL | Status: AC
  Administered 2023-10-03: 30 mL via ORAL
  Filled 2023-10-03: qty 30

## 2023-10-03 MED ORDER — METHYLPREDNISOLONE 4 MG PO TBPK: ORAL_TABLET | ORAL | 0 refills | Status: DC

## 2023-10-03 MED ORDER — LIDOCAINE VISCOUS HCL 2 % MT SOLN
15.0000 mL | Freq: Once | OROMUCOSAL | Status: AC
  Administered 2023-10-03: 15 mL via OROMUCOSAL
  Filled 2023-10-03: qty 15

## 2023-10-03 MED ORDER — CLINDAMYCIN HCL 150 MG PO CAPS
300.0000 mg | ORAL_CAPSULE | Freq: Once | ORAL | Status: AC
  Administered 2023-10-03: 300 mg via ORAL
  Filled 2023-10-03: qty 2

## 2023-10-03 MED ORDER — IOHEXOL 300 MG/ML  SOLN
75.0000 mL | Freq: Once | INTRAMUSCULAR | Status: AC | PRN
  Administered 2023-10-03: 75 mL via INTRAVENOUS

## 2023-10-03 MED ORDER — LIDOCAINE VISCOUS HCL 2 % MT SOLN
15.0000 mL | Freq: Once | OROMUCOSAL | Status: AC
  Administered 2023-10-03: 15 mL via ORAL
  Filled 2023-10-03: qty 15

## 2023-10-03 MED ORDER — DEXAMETHASONE SODIUM PHOSPHATE 10 MG/ML IJ SOLN
20.0000 mg | Freq: Once | INTRAMUSCULAR | Status: AC
  Administered 2023-10-03: 20 mg
  Filled 2023-10-03: qty 2

## 2023-10-03 MED ORDER — IBUPROFEN 600 MG PO TABS: 600.0000 mg | ORAL_TABLET | Freq: Four times a day (QID) | ORAL | 0 refills | Status: AC | PRN

## 2023-10-03 MED ORDER — SODIUM CHLORIDE 0.9 % IV BOLUS: 1000.0000 mL | Freq: Once | INTRAVENOUS | Status: DC

## 2023-10-03 MED ORDER — ACETAMINOPHEN 325 MG PO TABS: 650.0000 mg | ORAL_TABLET | Freq: Four times a day (QID) | ORAL | 0 refills | Status: AC | PRN

## 2023-10-03 MED ORDER — CLINDAMYCIN HCL 300 MG PO CAPS: 300.0000 mg | ORAL_CAPSULE | Freq: Three times a day (TID) | ORAL | 0 refills | Status: AC

## 2023-10-03 MED ORDER — KETOROLAC TROMETHAMINE 15 MG/ML IJ SOLN
15.0000 mg | Freq: Once | INTRAMUSCULAR | Status: AC
Start: 2023-10-03 — End: 2023-10-03
  Administered 2023-10-03: 15 mg via INTRAVENOUS
  Filled 2023-10-03: qty 1

## 2023-10-03 NOTE — ED Triage Notes (Signed)
 Pt c/o sharp pains up esophagus from stomach area with sore throat just on left side x 3 days constant. States feels like something stuck in throat. Throat is swollen on both sides. A/o. Color wnl. Non diaphoretic. Checked temp last night and was 101 per pt. Denies n/v/d/dizziness.

## 2023-10-03 NOTE — Discharge Instructions (Addendum)
 It was a pleasure caring for you today in the emergency department.  Your symptoms should continue to improve with the medications, please follow up with your PCP and ENT  Please return to the emergency department for any worsening or worrisome symptoms.

## 2023-10-03 NOTE — ED Provider Notes (Signed)
 Mark Goodman Provider Note  CSN: [id number] Arrival date & time: 10/03/23 8077  Chief Complaint(s) Chest Pain  HPI Mark Goodman is a 43 y.o. male with past medical history as below, significant for ADHD, bipolar 1 disorder, HTN, PTSD who presents to the ED with complaint of neck pain/ dysphagia  Symptoms ongoing around 3 days, seems to be worsening. Having dysphagia but tolerating po intake, tolerating secretions. No vomiting or nausea, reported fever last night but none today. No cough or dyspnea. No similar symptoms in the past. Reports pain is to lower aspect of his neck/anterior. Worse when swallowing.   Past Medical History Past Medical History:  Diagnosis Date   ADHD (attention deficit hyperactivity disorder)    Bipolar 1 disorder (HCC)    Cleft uvula    Hypertension    PTSD (post-traumatic stress disorder)    Patient Active Problem List   Diagnosis Date Noted   Chronic suppurative otitis media of both ears 05/24/2023   Skin tag 05/24/2023   Bilateral otitis media with effusion 04/04/2023   Chronic pain of left knee 11/23/2022   Laceration of left forearm 05/13/2022   Erectile dysfunction 12/15/2021   Morbid obesity (HCC) 12/15/2021   Prediabetes 12/15/2021   Encounter for general adult medical examination with abnormal findings 04/09/2021   Obstructive sleep apnea syndrome 02/05/2021   Bipolar disorder (HCC) 04/24/2020   Gastroesophageal reflux disease 04/24/2020   Migraine 04/24/2020   Mixed hyperlipidemia 04/24/2020   Primary hypertension 04/24/2020   Anxiety 04/24/2020   Tobacco abuse 04/24/2020   Home Medication(s) Prior to Admission medications   Medication Sig Start Date End Date Taking? Authorizing Provider  clindamycin  (CLEOCIN ) 300 MG capsule Take 1 capsule (300 mg total) by mouth 3 (three) times daily for 10 days. 10/04/23 10/14/23 Yes Elnor Jayson LABOR, DO  methylPREDNISolone  (MEDROL  DOSEPAK) 4 MG TBPK tablet  Per manufacturer's instructions 10/04/23  Yes Elnor Jayson A, DO  albuterol  (VENTOLIN  HFA) 108 (90 Base) MCG/ACT inhaler Inhale 2 puffs into the lungs every 6 (six) hours as needed for wheezing or shortness of breath. 07/04/23   Patel, Rutwik K, MD  ALPRAZolam (XANAX) 1 MG tablet Take 0.5-1 mg by mouth daily as needed. 09/26/20   [provider]  aspirin  EC 81 MG tablet Take 1 tablet (81 mg total) by mouth daily. Swallow whole. 10/05/21   Tobie Suzzane POUR, MD  atorvastatin  (LIPITOR) 20 MG tablet Take 1 tablet (20 mg total) by mouth daily. 07/04/23   Tobie Suzzane POUR, MD  fluticasone  (FLONASE ) 50 MCG/ACT nasal spray SMARTSIG:1-2 Spray(s) Both Nares Daily PRN 07/04/23   Tobie Suzzane POUR, MD  ibuprofen  (ADVIL ) 600 MG tablet Take 1 tablet (600 mg total) by mouth every 8 (eight) hours as needed for moderate pain (pain score 4-6). 05/03/23   Bevely Doffing, FNP  olmesartan -hydrochlorothiazide (BENICAR  HCT) 20-12.5 MG tablet Take 1 tablet by mouth daily. 07/04/23   Tobie Suzzane POUR, MD  omeprazole  (PRILOSEC) 40 MG capsule Take 1 capsule by mouth once daily 09/28/23   Patel, Rutwik K, MD  propranolol  (INDERAL ) 20 MG tablet Take 1 tablet (20 mg total) by mouth 2 (two) times daily. 07/04/23   Tobie Suzzane POUR, MD  tadalafil  (CIALIS ) 20 MG tablet Take 1 tablet (20 mg total) by mouth as needed for erectile dysfunction. 11/23/22   Tobie Suzzane POUR, MD  Past Surgical History Past Surgical History:  Procedure Laterality Date   EXTERNAL EAR SURGERY     Family History History reviewed. No pertinent family history.  Social History Social History   Tobacco Use   Smoking status: Every Day    Current packs/day: 1.00    Average packs/day: 1 pack/day for 20.0 years (20.0 ttl pk-yrs)    Types: Cigarettes   Smokeless tobacco: Never  Vaping Use   Vaping status: Never Used  Substance Use  Topics   Alcohol use: No   Drug use: No   Allergies Tomato and Penicillins  Review of Systems A thorough review of systems was obtained and all systems are negative except as noted in the HPI and PMH.   Physical Exam Vital Signs  I have reviewed the triage vital signs BP 114/85   Pulse 67   Temp 98 F (36.7 C)   Resp 20   Ht 6' (1.829 m)   Wt 119.7 kg   SpO2 98%   BMI 35.80 kg/m  Physical Exam Vitals and nursing note reviewed.  Constitutional:      General: He is not in acute distress.    Appearance: Normal appearance. He is well-developed. He is not ill-appearing.  HENT:     Head: Normocephalic and atraumatic.     Right Ear: External ear normal.     Left Ear: External ear normal.     Nose: Nose normal.     Mouth/Throat:     Mouth: Mucous membranes are moist.     Pharynx: Oropharynx is clear. Posterior oropharyngeal erythema present.     Comments: No drooling/trismus/ stridor Eyes:     General: No scleral icterus.       Right eye: No discharge.        Left eye: No discharge.  Cardiovascular:     Rate and Rhythm: Normal rate and regular rhythm.     Pulses: Normal pulses.  Pulmonary:     Effort: Pulmonary effort is normal. No respiratory distress.     Breath sounds: No stridor.  Abdominal:     General: Abdomen is flat. There is no distension.     Tenderness: There is no guarding.  Musculoskeletal:        General: No deformity.     Cervical back: Normal range of motion. No rigidity.  Skin:    General: Skin is warm and dry.     Coloration: Skin is not cyanotic, jaundiced or pale.  Neurological:     Mental Status: He is alert.  Psychiatric:        Speech: Speech normal.        Behavior: Behavior normal. Behavior is cooperative.     ED Results and Treatments Labs (all labs ordered are listed, but only abnormal results are displayed) Labs Reviewed  CBC WITH DIFFERENTIAL/PLATELET - Abnormal; Notable for the following components:      Result Value   WBC  18.1 (*)    RBC 4.21 (*)    Neutro Abs 13.5 (*)    Eosinophils Absolute 0.6 (*)    Abs Immature Granulocytes 0.10 (*)    All other components within normal limits  GROUP A STREP BY PCR  COMPREHENSIVE METABOLIC PANEL WITH GFR  LIPASE, BLOOD  TROPONIN I (HIGH SENSITIVITY)  TROPONIN I (HIGH SENSITIVITY)  Radiology CT Soft Tissue Neck W Contrast Result Date: 10/03/2023 CLINICAL DATA:  dysphagia, fb sensation. EXAM: CT NECK WITH CONTRAST TECHNIQUE: Multidetector CT imaging of the neck was performed using the standard protocol following the bolus administration of intravenous contrast. RADIATION DOSE REDUCTION: This exam was performed according to the departmental dose-optimization program which includes automated exposure control, adjustment of the mA and/or kV according to patient size and/or use of iterative reconstruction technique. CONTRAST:  75mL OMNIPAQUE  IOHEXOL  300 MG/ML  SOLN COMPARISON:  None Available. FINDINGS: Pharynx and larynx: Small volume of upper cervical prevertebral edema without discrete drainable fluid collection. Punctate left tonsillith. Salivary glands: No inflammation, mass, or stone. Thyroid : Normal. Lymph nodes: None enlarged or abnormal density. Vascular: Not well assessed due to non arterial contrast timing. Limited intracranial: Negative. Visualized orbits: Small Mastoids and visualized paranasal sinuses: Left anterior ethmoid air cell mucosal thickening. Right sigmoid diverticulum. Skeleton: No evidence of acute abnormality on limited assessment. Upper chest: The lung apices are clear. IMPRESSION: Small volume of upper cervical prevertebral edema which is nonspecific but could be infectious, inflammatory, or traumatic. No discrete, drainable fluid collection. Electronically Signed   By: Gilmore GORMAN Molt M.D.   On: 10/03/2023 21:54   DG Chest 2  View Result Date: 10/03/2023 CLINICAL DATA:  Left-sided chest pain, initial encounter EXAM: CHEST - 2 VIEW COMPARISON:  02/26/2018 FINDINGS: The heart size and mediastinal contours are within normal limits. Both lungs are clear. The visualized skeletal structures are unremarkable. IMPRESSION: No active cardiopulmonary disease. Electronically Signed   By: Oneil Devonshire M.D.   On: 10/03/2023 20:19    Pertinent labs & imaging results that were available during my care of the patient were reviewed by me and considered in my medical decision making (see MDM for details).  Medications Ordered in ED Medications  ketorolac  (TORADOL ) 15 MG/ML injection 15 mg (15 mg Intravenous Given 10/03/23 2048)  lidocaine  (XYLOCAINE ) 2 % viscous mouth solution 15 mL (15 mLs Mouth/Throat Given 10/03/23 2134)  iohexol  (OMNIPAQUE ) 300 MG/ML solution 75 mL (75 mLs Intravenous Contrast Given 10/03/23 2111)  dexamethasone  (DECADRON ) injection 20 mg (20 mg Other Given 10/03/23 2251)  alum & mag hydroxide-simeth (MAALOX/MYLANTA) 200-200-20 MG/5ML suspension 30 mL (30 mLs Oral Given 10/03/23 2329)    And  lidocaine  (XYLOCAINE ) 2 % viscous mouth solution 15 mL (15 mLs Oral Given 10/03/23 2329)  clindamycin  (CLEOCIN ) capsule 300 mg (300 mg Oral Given 10/03/23 2328)                                                                                                                                     Procedures Procedures  (including critical care time)  Medical Decision Making / ED Course    Medical Decision Making:    Alois Colgan is a 43 y.o. male with past medical history as below, significant for ADHD, bipolar 1 disorder, HTN, PTSD who presents to the ED  with complaint of neck pain/ dysphagia. The complaint involves an extensive differential diagnosis and also carries with it a high risk of complications and morbidity.  Serious etiology was considered. Ddx includes but is not limited to: pharyngitis, tonsillitis, PTA, RPA,  FB, neoplasm, URI, pna, etc  Complete initial physical exam performed, notably the patient was in nad, non toxic, hds.    Reviewed and confirmed nursing documentation for past medical history, family history, social history.  Vital signs reviewed.    Dysphagia  Chest pain> - Patient with pharyngitis, dysphagia over the past 3 days, seems worsening. - No drooling, stridor or trismus, voice is hoarse per family bedside - No evidence of peritonsillar abscess on exam - He has leukocytosis, he is afebrile, does not appear to be septic, he is nontoxic - CT neck with nonspecific prevertebral swelling, spoke with ENT, recommend antibiotics, steroids.  No acute ENT intervention necessary at this time - His chest pain is likely secondary to his pharyngitis, ACS seems unlikely.  Troponin negative x 2, EKG nonischemic - Symptoms have improved following Toradol , Decadron .  Will give clindamycin  here, clindamycin  for home.  He has penicillin allergy.  Medrol  Dosepak.  Follow-up PCP and ENT.  Clinical Course as of 10/03/23 7668  Mon Oct 03, 2023  2237 Spoke w/ ENT Dr MARLA Blanch [SG]    Clinical Course User Index [SG] Elnor Savant A, DO     11:31 PM: Feeling better, no drooling stridor or trismus, HDS, no hypoxia, tolerating p.o.  I have discussed the diagnosis/risks/treatment options with the patient and family.  Evaluation and diagnostic testing in the emergency department does not suggest an emergent condition requiring admission or immediate intervention beyond what has been performed at this time.  They will follow up with PCP, ENT. We also discussed returning to the ED immediately if new or worsening sx occur. We discussed the sx which are most concerning (e.g., sudden worsening pain, fever, inability to tolerate by mouth, difficulty breathing, ) that necessitate immediate return.    The patient appears reasonably screen and/or stabilized for discharge and I doubt any other medical condition or other  [facility] requiring further screening, evaluation, or treatment in the ED at this time prior to discharge.                 Additional history obtained: -Additional history obtained from family -External records from outside source obtained and reviewed including: Chart review including previous notes, labs, imaging, consultation notes including  Allergy list, prior medications   Lab Tests: -I ordered, reviewed, and interpreted labs.   The pertinent results include:   Labs Reviewed  CBC WITH DIFFERENTIAL/PLATELET - Abnormal; Notable for the following components:      Result Value   WBC 18.1 (*)    RBC 4.21 (*)    Neutro Abs 13.5 (*)    Eosinophils Absolute 0.6 (*)    Abs Immature Granulocytes 0.10 (*)    All other components within normal limits  GROUP A STREP BY PCR  COMPREHENSIVE METABOLIC PANEL WITH GFR  LIPASE, BLOOD  TROPONIN I (HIGH SENSITIVITY)  TROPONIN I (HIGH SENSITIVITY)    Notable for leukocytosis  EKG   EKG Interpretation Date/Time:    Ventricular Rate:    PR Interval:    QRS Duration:    QT Interval:    QTC Calculation:   R Axis:      Text Interpretation:           Imaging Studies ordered: I ordered  imaging studies including CT neck, chest x-ray I independently visualized the following imaging with scope of interpretation limited to determining acute life threatening conditions related to emergency care; findings noted above I agree with the radiologist interpretation If any imaging was obtained with contrast I closely monitored patient for any possible adverse reaction a/w contrast administration in the emergency department   Medicines ordered and prescription drug management: Meds ordered this encounter  Medications   ketorolac  (TORADOL ) 15 MG/ML injection 15 mg   DISCONTD: sodium chloride  0.9 % bolus 1,000 mL   lidocaine  (XYLOCAINE ) 2 % viscous mouth solution 15 mL   iohexol  (OMNIPAQUE ) 300 MG/ML solution 75 mL   dexamethasone   (DECADRON ) injection 20 mg   AND Linked Order Group    alum & mag hydroxide-simeth (MAALOX/MYLANTA) 200-200-20 MG/5ML suspension 30 mL    lidocaine  (XYLOCAINE ) 2 % viscous mouth solution 15 mL   clindamycin  (CLEOCIN ) capsule 300 mg   clindamycin  (CLEOCIN ) 300 MG capsule    Sig: Take 1 capsule (300 mg total) by mouth 3 (three) times daily for 10 days.    Dispense:  30 capsule    Refill:  0   methylPREDNISolone  (MEDROL  DOSEPAK) 4 MG TBPK tablet    Sig: Per manufacturer's instructions    Dispense:  1 each    Refill:  0    -I have reviewed the patients home medicines and have made adjustments as needed   Consultations Obtained: I requested consultation with the ENT,  and discussed lab and imaging findings as well as pertinent plan - they recommend: Antibiotics, steroids   Cardiac Monitoring: The patient was maintained on a cardiac monitor.  I personally viewed and interpreted the cardiac monitored which showed an underlying rhythm of: NSR Continuous pulse oximetry interpreted by myself, 98% on RA.    Social Determinants of Health:  Diagnosis or treatment significantly limited by social determinants of health: current smoker and obesity   Reevaluation: After the interventions noted above, I reevaluated the patient and found that they have improved  Co morbidities that complicate the patient evaluation  Past Medical History:  Diagnosis Date   ADHD (attention deficit hyperactivity disorder)    Bipolar 1 disorder (HCC)    Cleft uvula    Hypertension    PTSD (post-traumatic stress disorder)       Dispostion: Disposition decision including need for hospitalization was considered, and patient discharged from emergency department.    Final Clinical Impression(s) / ED Diagnoses Final diagnoses:  Dysphagia, unspecified type  Pharyngitis, unspecified etiology  Atypical chest pain        Elnor Jayson LABOR, DO 10/03/23 2331

## 2023-10-20 ENCOUNTER — Ambulatory Visit (INDEPENDENT_AMBULATORY_CARE_PROVIDER_SITE_OTHER): Admitting: Otolaryngology

## 2023-10-26 ENCOUNTER — Ambulatory Visit: Payer: Medicare HMO

## 2023-10-26 VITALS — Ht 72.0 in | Wt 264.0 lb

## 2023-10-26 DIAGNOSIS — Z0000 Encounter for general adult medical examination without abnormal findings: Secondary | ICD-10-CM

## 2023-10-26 DIAGNOSIS — Z5320 Procedure and treatment not carried out because of patient's decision for unspecified reasons: Secondary | ICD-10-CM

## 2023-10-26 NOTE — Patient Instructions (Addendum)
 [name],  Thank you for taking the time for your Medicare Wellness Visit. I appreciate your continued commitment to your health goals. Please review the care plan we discussed, and feel free to reach out if I can assist you further.  You declined a lung cancer screening today. If you change your mind, please reach out to our office so that we can order that for you.   Key Benefits of Lung Cancer Screening --Reduced Lung Cancer Deaths: Studies show that annual LDCT screening can lower the risk of dying from lung cancer by about 20-24% in high-risk populations.  --Early Detection: Screening identifies lung cancer in its early stages, often before symptoms appear, when it is still small and localized.  --Increased Curability: Early-stage cancers are more responsive to treatment and offer a higher likelihood of a cure.  --Less Invasive Treatment Options: Early detection can allow for more minimally invasive surgical procedures, potentially removing less lung tissue.  --Improved Survival Rates: Finding cancer early significantly increases the chances of surviving at least five years after diagnosis compared to later-stage detection.   Why Early Detection is Crucial --Asymptomatic Disease: Lung cancer rarely causes symptoms in its early stages, making screening essential for finding the disease before it becomes advanced.  --High Mortality Rate: Lung cancer is a leading cause of cancer deaths, and it is often found at later stages when survival rates are much lower.  --Who Should Consider Screening --Individuals aged 78 to 57 who currently smoke or have quit within the past 15 years.  --Individuals with a significant smoking history, such as at least a 20-pack-year history (smoking one pack a day for 20 years, or a comparable amount).   Medicare recommends these wellness visits once per year to help you and your care team stay ahead of potential health issues. These visits are designed to focus  on prevention, allowing your provider to concentrate on managing your acute and chronic conditions during your regular appointments.  Please note that Annual Wellness Visits do not include a physical exam. Some assessments may be limited, especially if the visit was conducted virtually. If needed, we may recommend a separate in-person follow-up with your provider.  Wishing you excellent health and many blessings in the year to come!  -Kimberl Vig, CMA  Ongoing Care Seeing your primary care provider every 3 to 6 months helps us  monitor your health and provide consistent, personalized care.   Recommended Screenings:  Health Maintenance  Topic Date Due   COVID-19 Vaccine (1) Never done   Pneumococcal Vaccine (1 of 2 - PCV) Never done   Hepatitis B Vaccine (1 of 3 - 19+ 3-dose series) Never done   HPV Vaccine (1 - Risk 3-dose SCDM series) Never done   Flu Shot  Never done   Medicare Annual Wellness Visit  10/25/2024   DTaP/Tdap/Td vaccine (2 - Td or Tdap) 05/12/2032   Hepatitis C Screening  Completed   HIV Screening  Completed   Meningitis B Vaccine  Aged Out       10/26/2023    2:51 PM  Advanced Directives  Does Patient Have a Medical Advance Directive? No  Would patient like information on creating a medical advance directive? No - Patient declined   Advance Care Planning is important because it: Ensures you receive medical care that aligns with your values, goals, and preferences. Provides guidance to your family and loved ones, reducing the emotional burden of decision-making during critical moments.  Vision: Annual vision screenings are recommended for  early detection of glaucoma, cataracts, and diabetic retinopathy. These exams can also reveal signs of chronic conditions such as diabetes and high blood pressure.  Dental: Annual dental screenings help detect early signs of oral cancer, gum disease, and other conditions linked to overall health, including heart disease and  diabetes.  Please see the attached documents for additional preventive care recommendations.

## 2023-10-26 NOTE — Progress Notes (Signed)
 Patient declined lung cancer screening. Aware of recommended vaccines.  Subjective:   Mark Goodman is a 43 y.o. who presents for a Medicare Wellness preventive visit.  As a reminder, Annual Wellness Visits don't include a physical exam, and some assessments may be limited, especially if this visit is performed virtually. We may recommend an in-person follow-up visit with your provider if needed.  Visit Complete: Virtual I connected with  Mark Goodman on 10/26/23 by a audio enabled telemedicine application and verified that I am speaking with the correct person using two identifiers.  Patient Location: Home  Provider Location: Home Office  I discussed the limitations of evaluation and management by telemedicine. The patient expressed understanding and agreed to proceed.  Vital Signs: Because this visit was a virtual/telehealth visit, some criteria may be missing or patient reported. Any vitals not documented were not able to be obtained and vitals that have been documented are patient reported.  VideoDeclined- This patient declined Librarian, academic. Therefore the visit was completed with audio only.  Persons Participating in Visit: Patient.  AWV Questionnaire: No: Patient Medicare AWV questionnaire was not completed prior to this visit.  Cardiac Risk Factors include: advanced age (>25men, >43 women);hypertension;male gender;obesity (BMI >30kg/m2);smoking/ tobacco exposure     Objective:    Today's Vitals   10/26/23 1450  Weight: 264 lb (119.7 kg)  Height: 6' (1.829 m)  PainSc: 0-No pain   Body mass index is 35.8 kg/m.     10/26/2023    2:51 PM 10/25/2022    3:41 PM 10/05/2021    2:06 PM 10/01/2020    3:16 PM 07/13/2020    6:29 AM 05/21/2020    5:43 PM 02/26/2018    6:49 PM  Advanced Directives  Does Patient Have a Medical Advance Directive? No No No No No No No   Would patient like information on creating a medical advance directive?  No - Patient declined Yes (MAU/Ambulatory/Procedural Areas - Information given) No - Patient declined No - Patient declined No - Patient declined       Data saved with a previous flowsheet row definition    Current Medications (verified) Outpatient Encounter Medications as of 10/26/2023  Medication Sig   acetaminophen  (TYLENOL ) 325 MG tablet Take 2 tablets (650 mg total) by mouth every 6 (six) hours as needed.   albuterol  (VENTOLIN  HFA) 108 (90 Base) MCG/ACT inhaler Inhale 2 puffs into the lungs every 6 (six) hours as needed for wheezing or shortness of breath.   ALPRAZolam (XANAX) 1 MG tablet Take 0.5-1 mg by mouth daily as needed.   aspirin  EC 81 MG tablet Take 1 tablet (81 mg total) by mouth daily. Swallow whole.   atorvastatin  (LIPITOR) 20 MG tablet Take 1 tablet (20 mg total) by mouth daily.   fluticasone  (FLONASE ) 50 MCG/ACT nasal spray SMARTSIG:1-2 Spray(s) Both Nares Daily PRN   ibuprofen  (ADVIL ) 600 MG tablet Take 1 tablet (600 mg total) by mouth every 6 (six) hours as needed.   methylPREDNISolone  (MEDROL  DOSEPAK) 4 MG TBPK tablet Per manufacturer's instructions   olmesartan -hydrochlorothiazide (BENICAR  HCT) 20-12.5 MG tablet Take 1 tablet by mouth daily.   omeprazole  (PRILOSEC) 40 MG capsule Take 1 capsule by mouth once daily   propranolol  (INDERAL ) 20 MG tablet Take 1 tablet (20 mg total) by mouth 2 (two) times daily.   tadalafil  (CIALIS ) 20 MG tablet Take 1 tablet (20 mg total) by mouth as needed for erectile dysfunction.   No facility-administered  encounter medications on file as of 10/26/2023.    Allergies (verified) Tomato and Penicillins   History: Past Medical History:  Diagnosis Date   ADHD (attention deficit hyperactivity disorder)    Bipolar 1 disorder (HCC)    Cleft uvula    Hypertension    PTSD (post-traumatic stress disorder)    Past Surgical History:  Procedure Laterality Date   EXTERNAL EAR SURGERY     History reviewed. No pertinent family  history. Social History   Socioeconomic History   Marital status: Married    Spouse name: Not on file   Number of children: 2   Years of education: Not on file   Highest education level: Not on file  Occupational History   Not on file  Tobacco Use   Smoking status: Every Day    Current packs/day: 1.00    Average packs/day: 1 pack/day for 36.8 years (36.8 ttl pk-yrs)    Types: Cigarettes    Start date: 1989   Smokeless tobacco: Never  Vaping Use   Vaping status: Never Used  Substance and Sexual Activity   Alcohol use: No   Drug use: No   Sexual activity: Not on file  Other Topics Concern   Not on file  Social History Narrative   Currently on disability.   2 children   Social Drivers of Corporate investment banker Strain: Low Risk  (10/26/2023)   Overall Financial Resource Strain (CARDIA)    Difficulty of Paying Living Expenses: Not hard at all  Food Insecurity: No Food Insecurity (10/26/2023)   Hunger Vital Sign    Worried About Running Out of Food in the Last Year: Never true    Ran Out of Food in the Last Year: Never true  Transportation Needs: No Transportation Needs (10/26/2023)   PRAPARE - Administrator, Civil Service (Medical): No    Lack of Transportation (Non-Medical): No  Physical Activity: Inactive (10/26/2023)   Exercise Vital Sign    Days of Exercise per Week: 0 days    Minutes of Exercise per Session: 0 min  Stress: No Stress Concern Present (10/26/2023)   [employer] of Occupational Health - Occupational Stress Questionnaire    Feeling of Stress: Not at all  Social Connections: Moderately Integrated (10/26/2023)   Social Connection and Isolation Panel    Frequency of Communication with Friends and Family: More than three times a week    Frequency of Social Gatherings with Friends and Family: More than three times a week    Attends Religious Services: More than 4 times per year    Active Member of [employer] or Organizations: No    Attends  Engineer, structural: Never    Marital Status: Married    Tobacco Counseling Ready to quit: No Counseling given: Yes    Clinical Intake:  Pre-visit preparation completed: Yes  Pain : No/denies pain Pain Score: 0-No pain     BMI - recorded: 35.8 Nutritional Status: BMI > 30  Obese Nutritional Risks: None Diabetes: No  Lab Results  Component Value Date   HGBA1C 5.6 05/12/2022   HGBA1C 6.2 (H) 04/24/2020     How often do you need to have someone help you when you read instructions, pamphlets, or other written materials from your doctor or pharmacy?: 1 - Never  Interpreter Needed?: No  Information entered by :: Kiaan Overholser W CMA (AAMA)   Activities of Daily Living     10/26/2023    2:51 PM  In  your present state of health, do you have any difficulty performing the following activities:  Hearing? 0  Vision? 0  Difficulty concentrating or making decisions? 0  Walking or climbing stairs? 0  Dressing or bathing? 0  Doing errands, shopping? 0  Preparing Food and eating ? N  Using the Toilet? N  In the past six months, have you accidently leaked urine? N  Do you have problems with loss of bowel control? N  Managing your Medications? N  Managing your Finances? N  Housekeeping or managing your Housekeeping? N    Patient Care Team: Tobie Suzzane POUR, MD as PCP - General (Internal Medicine) Leroux-Martinez, Rosaline Jansky, AUD (Audiology) Darroll Anes, DO (Optometry)  I have updated your Care Teams any recent Medical Services you may have received from other providers in the past year.     Assessment:   This is a routine wellness examination for Mark Goodman.  Hearing/Vision screen Hearing Screening - Comments:: Patient denies any hearing difficulties.   Vision Screening - Comments:: Wears rx glasses - up to date with routine eye exams with  Anes Darroll    Goals Addressed               This Visit's Progress     Remain active, healthy, and independent  (pt-stated)          Depression Screen     10/26/2023    2:53 PM 05/24/2023    1:06 PM 05/03/2023    4:26 PM 04/04/2023    1:30 PM 11/23/2022    1:40 PM 10/25/2022    3:40 PM 05/13/2022    2:49 PM  PHQ 2/9 Scores  PHQ - 2 Score 0 0 0 1 0 0 0  PHQ- 9 Score 0 3 2 4         Fall Risk     10/26/2023    2:52 PM 05/24/2023    1:06 PM 05/03/2023    4:26 PM 11/23/2022    1:40 PM 10/25/2022    3:39 PM  Fall Risk   Falls in the past year? 0 0 0 0 0  Number falls in past yr: 0 0 0 0 0  Injury with Fall? 0 0 0 0 0  Risk for fall due to : No Fall Risks No Fall Risks No Fall Risks No Fall Risks No Fall Risks  Follow up Falls evaluation completed;Education provided;Falls prevention discussed Falls evaluation completed Falls evaluation completed Falls evaluation completed Falls prevention discussed    MEDICARE RISK AT HOME:  Medicare Risk at Home Any stairs in or around the home?: No If so, are there any without handrails?: No Home free of loose throw rugs in walkways, pet beds, electrical cords, etc?: Yes Adequate lighting in your home to reduce risk of falls?: Yes Life alert?: No Use of a cane, walker or w/c?: No Grab bars in the bathroom?: Yes Shower chair or bench in shower?: No Elevated toilet seat or a handicapped toilet?: Yes  TIMED UP AND GO:  Was the test performed?  No  Cognitive Function: 6CIT completed        10/26/2023    2:52 PM 10/25/2022    3:42 PM 10/05/2021    2:10 PM 10/01/2020    3:19 PM  6CIT Screen  What Year? 0 points 0 points 0 points 0 points  What month? 0 points 0 points 0 points 0 points  What time? 0 points 0 points 0 points 0 points  Count back from 20 0  points 0 points 0 points 0 points  Months in reverse 0 points 0 points 0 points 4 points  Repeat phrase 0 points 0 points 0 points 0 points  Total Score 0 points 0 points 0 points 4 points    Immunizations Immunization History  Administered Date(s) Administered   Tdap 05/13/2022    Screening  Tests Health Maintenance  Topic Date Due   COVID-19 Vaccine (1) Never done   Pneumococcal Vaccine (1 of 2 - PCV) Never done   Hepatitis B Vaccines 19-59 Average Risk (1 of 3 - 19+ 3-dose series) Never done   HPV VACCINES (1 - Risk 3-dose SCDM series) Never done   Influenza Vaccine  Never done   Medicare Annual Wellness (AWV)  10/25/2024   DTaP/Tdap/Td (2 - Td or Tdap) 05/12/2032   Hepatitis C Screening  Completed   HIV Screening  Completed   Meningococcal B Vaccine  Aged Out    Health Maintenance Health Maintenance Due  Topic Date Due   COVID-19 Vaccine (1) Never done   Pneumococcal Vaccine (1 of 2 - PCV) Never done   Hepatitis B Vaccines 19-59 Average Risk (1 of 3 - 19+ 3-dose series) Never done   HPV VACCINES (1 - Risk 3-dose SCDM series) Never done   Influenza Vaccine  Never done   Health Maintenance Items Addressed: Patient declined lung cancer screening today. Patient stated he had already had one. I advised patient that it is recommended yearly for current smokers with a history of more than 20 pack years or have quit in the last 15 years.   Patient declined screening today  Additional Screening:  Vision Screening: Recommended annual ophthalmology exams for early detection of glaucoma and other disorders of the eye. Patient up to date on exams.   Dental Screening: Recommended annual dental exams for proper oral hygiene  Community Resource Referral / Chronic Care Management: CRR required this visit?  No   CCM required this visit?  No   Plan:    I have personally reviewed and noted the following in the patient's chart:   Medical and social history Use of alcohol, tobacco or illicit drugs  Current medications and supplements including opioid prescriptions. Patient is not currently taking opioid prescriptions. Functional ability and status Nutritional status Physical activity Advanced directives List of other physicians Hospitalizations, surgeries, and ER  visits in previous 12 months Vitals Screenings to include cognitive, depression, and falls Referrals and appointments  In addition, I have reviewed and discussed with patient certain preventive protocols, quality metrics, and best practice recommendations. A written personalized care plan for preventive services as well as general preventive health recommendations were provided to patient.   Mable Dara, CMA   10/26/2023   After Visit Summary: (MyChart) Due to this being a telephonic visit, the after visit summary with patients personalized plan was offered to patient via MyChart   Notes: Nothing significant to report at this time.

## 2023-10-28 DIAGNOSIS — F902 Attention-deficit hyperactivity disorder, combined type: Secondary | ICD-10-CM | POA: Diagnosis not present

## 2023-10-28 DIAGNOSIS — F3162 Bipolar disorder, current episode mixed, moderate: Secondary | ICD-10-CM | POA: Diagnosis not present

## 2023-10-28 DIAGNOSIS — Z79899 Other long term (current) drug therapy: Secondary | ICD-10-CM | POA: Diagnosis not present

## 2023-10-28 DIAGNOSIS — Z5181 Encounter for therapeutic drug level monitoring: Secondary | ICD-10-CM | POA: Diagnosis not present

## 2023-10-28 DIAGNOSIS — F1320 Sedative, hypnotic or anxiolytic dependence, uncomplicated: Secondary | ICD-10-CM | POA: Diagnosis not present

## 2023-10-28 DIAGNOSIS — F411 Generalized anxiety disorder: Secondary | ICD-10-CM | POA: Diagnosis not present

## 2023-11-11 DIAGNOSIS — F902 Attention-deficit hyperactivity disorder, combined type: Secondary | ICD-10-CM | POA: Diagnosis not present

## 2023-11-11 DIAGNOSIS — F3162 Bipolar disorder, current episode mixed, moderate: Secondary | ICD-10-CM | POA: Diagnosis not present

## 2023-11-11 DIAGNOSIS — F411 Generalized anxiety disorder: Secondary | ICD-10-CM | POA: Diagnosis not present

## 2023-11-21 ENCOUNTER — Other Ambulatory Visit: Payer: Self-pay | Admitting: Internal Medicine

## 2023-11-21 DIAGNOSIS — K219 Gastro-esophageal reflux disease without esophagitis: Secondary | ICD-10-CM

## 2023-11-21 DIAGNOSIS — E782 Mixed hyperlipidemia: Secondary | ICD-10-CM

## 2023-11-21 DIAGNOSIS — G43809 Other migraine, not intractable, without status migrainosus: Secondary | ICD-10-CM

## 2023-11-21 DIAGNOSIS — I10 Essential (primary) hypertension: Secondary | ICD-10-CM

## 2023-11-21 MED ORDER — OLMESARTAN MEDOXOMIL-HCTZ 20-12.5 MG PO TABS: 1.0000 | ORAL_TABLET | Freq: Every day | ORAL | 3 refills | Status: AC

## 2023-11-21 MED ORDER — ATORVASTATIN CALCIUM 20 MG PO TABS: 20.0000 mg | ORAL_TABLET | Freq: Every day | ORAL | 3 refills | Status: AC

## 2023-11-21 MED ORDER — PROPRANOLOL HCL 20 MG PO TABS: 20.0000 mg | ORAL_TABLET | Freq: Two times a day (BID) | ORAL | 5 refills | Status: AC

## 2023-11-21 MED ORDER — OMEPRAZOLE 40 MG PO CPDR: 40.0000 mg | DELAYED_RELEASE_CAPSULE | Freq: Every day | ORAL | 0 refills | Status: DC

## 2023-11-21 NOTE — Telephone Encounter (Signed)
 Copied from CRM [id number]. Topic: Clinical - Medication Refill >> Nov 21, 2023  1:42 PM Winona R wrote: Medication:  omeprazole  (PRILOpropranolol (INDERAL ) 20 MG tabletSEC) 40 MG capsule- completley out  olmesartan -hydrochlorothiazide (BENICAR  HCT) 20-12.5 MG tablet atorvastatin  (LIPITOR) 20 MG tablet propranolol  (INDERAL ) 20 MG tablet  Has the patient contacted their pharmacy? Yes (Agent: If no, request that the patient contact the pharmacy for the refill. If patient does not wish to contact the pharmacy document the reason why and proceed with request.) (Agent: If yes, when and what did the pharmacy advise?)  This is the patient's preferred pharmacy:  [facility] [address] [place] - [address] [place] [address] Phone: [phone] Fax: [phone]  Is this the correct pharmacy for this prescription? Yes If no, delete pharmacy and type the correct one.   Has the prescription been filled recently? No  Is the patient out of the medication? Yes  Has the patient been seen for an appointment in the last year OR does the patient have an upcoming appointment? Yes  Can we respond through MyChart? Yes  Agent: Please be advised that Rx refills may take up to 3 business days. We ask that you follow-up with your pharmacy.

## 2023-11-24 ENCOUNTER — Encounter: Payer: Self-pay | Admitting: Internal Medicine

## 2023-11-24 ENCOUNTER — Ambulatory Visit: Payer: Medicare (Managed Care) | Admitting: Internal Medicine

## 2023-11-24 VITALS — BP 122/79 | HR 69 | Ht 72.0 in | Wt 280.2 lb

## 2023-11-24 DIAGNOSIS — G4733 Obstructive sleep apnea (adult) (pediatric): Secondary | ICD-10-CM | POA: Diagnosis not present

## 2023-11-24 DIAGNOSIS — K219 Gastro-esophageal reflux disease without esophagitis: Secondary | ICD-10-CM | POA: Diagnosis not present

## 2023-11-24 DIAGNOSIS — G43809 Other migraine, not intractable, without status migrainosus: Secondary | ICD-10-CM

## 2023-11-24 DIAGNOSIS — E782 Mixed hyperlipidemia: Secondary | ICD-10-CM | POA: Diagnosis not present

## 2023-11-24 DIAGNOSIS — Z1211 Encounter for screening for malignant neoplasm of colon: Secondary | ICD-10-CM | POA: Diagnosis not present

## 2023-11-24 DIAGNOSIS — H663X3 Other chronic suppurative otitis media, bilateral: Secondary | ICD-10-CM

## 2023-11-24 DIAGNOSIS — I10 Essential (primary) hypertension: Secondary | ICD-10-CM

## 2023-11-24 MED ORDER — PANTOPRAZOLE SODIUM 40 MG PO TBEC: 40.0000 mg | DELAYED_RELEASE_TABLET | Freq: Every day | ORAL | 1 refills | Status: AC

## 2023-11-24 NOTE — Assessment & Plan Note (Signed)
 STOP-BANG: 6 Very high risk for OSA Had ordered home sleep test, but he did not get it done - but agrees to do it now, advised to contact [facility] Needs to continue to work on losing weight Side sleeping positions for now

## 2023-11-24 NOTE — Patient Instructions (Addendum)
 Please call [phone] for sleep study.  Please start taking Pantoprazole instead of Omeprazole .  Please continue to take medications as prescribed.  Please continue to follow low carb diet and perform moderate exercise/walking at least 150 mins/week.

## 2023-11-24 NOTE — Assessment & Plan Note (Signed)
 Has h/o recurrent otitis media Has completed ciprofloxacin  otic drops and Doxycycline , later Azithromycin  Has been evaluated by ENT specialist - did not opt for surgery

## 2023-11-24 NOTE — Assessment & Plan Note (Signed)
 Overall well-controlled On Propranolol  for ppx, takes it 20 mg QD Tylenol  PRN

## 2023-11-24 NOTE — Assessment & Plan Note (Signed)
 BP Readings from Last 1 Encounters:  11/24/23 122/79   Well-controlled with Olmesartan -HCTZ 20-12.5 mg QD Counseled for compliance with the medications Advised DASH diet and moderate exercise/walking, at least 150 mins/week

## 2023-11-24 NOTE — Assessment & Plan Note (Signed)
 BMI Readings from Last 3 Encounters:  11/24/23 38.00 kg/m  10/26/23 35.80 kg/m  10/03/23 35.80 kg/m   Associated with HTN, GERD, bipolar depression and HLD Had lost about 54 lbs with alcohol cessation and diet modification, but has been gaining weight lately Low-carb diet and moderate exercise advised

## 2023-11-24 NOTE — Assessment & Plan Note (Signed)
 On Atorvastatin  Check lipid profile - needs to get updated blood tests

## 2023-11-24 NOTE — Assessment & Plan Note (Signed)
 Uncontrolled with omeprazole  40 mg QD Switched to pantoprazole 40 mg QD Advised to avoid hot and spicy food

## 2023-11-24 NOTE — Progress Notes (Signed)
 Established Patient Office Visit  Subjective:  Patient ID: Mark Goodman, male    DOB: 1980/08/21  Age: 43 y.o. MRN: [id number]  CC:  Chief Complaint  Patient presents with   Hypertension    6 month f/u    Hyperlipidemia    6 month f/u    Medication Refill    Needs refills    HPI Mark Goodman is a 43 y.o. male with past medical history of HTN, HLD, Bipolar disorder, anxiety, GERD and tobacco abuse who presents for f/u of his chronic medical conditions.  HTN: BP is well-controlled now. Takes medications regularly. Patient denies headache, dizziness, chest pain, dyspnea or palpitations.  GERD: He takes omeprazole  40 mg QD currently.  He still reports epigastric pain and acid reflux at times.  Denies any nausea or vomiting currently.   He has witnessed apnea spells, snoring and daytime fatigue. He has not been able to get sleep study yet due to cost concern, but agrees to do it now. He had lost about 22 lbs with alcohol cessation and diet modification, but has been gaining weight lately.  He has cut down smoking to 0.5 pack/day from 1 pack/day.  He complains of right ear discharge, which is chronic.  He also reports mild bilateral ear pain. He had ENT evaluation. He has had left otitis media and cholesteatoma and was given ciprofloxacin  ear drops. Denies any fever or chills.  Denies nasal congestion, postnasal drip or sore throat currently. He has not decided about mastoidectomy yet.  He used to follow up with [facility] for bipolar disorder and anxiety, but needs to schedule follow-up.  He was on Abilify , as needed Xanax and BuSpar , but has stopped taking them. He takes Xanax occasionally.  He feels better now since meeting her current fiance.  Denies any anhedonia, SI or HI.   Past Medical History:  Diagnosis Date   ADHD (attention deficit hyperactivity disorder)    Bipolar 1 disorder (HCC)    Cleft uvula    Hypertension    PTSD (post-traumatic  stress disorder)     Past Surgical History:  Procedure Laterality Date   EXTERNAL EAR SURGERY      History reviewed. No pertinent family history.  Social History   Socioeconomic History   Marital status: Married    Spouse name: Not on file   Number of children: 2   Years of education: Not on file   Highest education level: Not on file  Occupational History   Not on file  Tobacco Use   Smoking status: Every Day    Current packs/day: 1.00    Average packs/day: 1.3 packs/day for 36.8 years (46.8 ttl pk-yrs)    Types: Cigarettes    Start date: 1989   Smokeless tobacco: Never  Vaping Use   Vaping status: Never Used  Substance and Sexual Activity   Alcohol use: No   Drug use: No   Sexual activity: Not on file  Other Topics Concern   Not on file  Social History Narrative   Currently on disability.   2 children   Social Drivers of Corporate Investment Banker Strain: Low Risk  (10/26/2023)   Overall Financial Resource Strain (CARDIA)    Difficulty of Paying Living Expenses: Not hard at all  Food Insecurity: No Food Insecurity (10/26/2023)   Hunger Vital Sign    Worried About Running Out of Food in the Last Year: Never true    Ran Out of  Food in the Last Year: Never true  Transportation Needs: No Transportation Needs (10/26/2023)   PRAPARE - Administrator, Civil Service (Medical): No    Lack of Transportation (Non-Medical): No  Physical Activity: Inactive (10/26/2023)   Exercise Vital Sign    Days of Exercise per Week: 0 days    Minutes of Exercise per Session: 0 min  Stress: No Stress Concern Present (10/26/2023)   [employer] of Occupational Health - Occupational Stress Questionnaire    Feeling of Stress: Not at all  Social Connections: Moderately Integrated (10/26/2023)   Social Connection and Isolation Panel    Frequency of Communication with Friends and Family: More than three times a week    Frequency of Social Gatherings with Friends and Family:  More than three times a week    Attends Religious Services: More than 4 times per year    Active Member of [employer] or Organizations: No    Attends Banker Meetings: Never    Marital Status: Married  Catering Manager Violence: Not At Risk (10/26/2023)   Humiliation, Afraid, Rape, and Kick questionnaire    Fear of Current or Ex-Partner: No    Emotionally Abused: No    Physically Abused: No    Sexually Abused: No    Outpatient Medications Prior to Visit  Medication Sig Dispense Refill   acetaminophen  (TYLENOL ) 325 MG tablet Take 2 tablets (650 mg total) by mouth every 6 (six) hours as needed. 36 tablet 0   albuterol  (VENTOLIN  HFA) 108 (90 Base) MCG/ACT inhaler Inhale 2 puffs into the lungs every 6 (six) hours as needed for wheezing or shortness of breath. 8 g 0   ALPRAZolam (XANAX) 1 MG tablet Take 0.5-1 mg by mouth daily as needed.     aspirin  EC 81 MG tablet Take 1 tablet (81 mg total) by mouth daily. Swallow whole. 30 tablet 11   atorvastatin  (LIPITOR) 20 MG tablet Take 1 tablet (20 mg total) by mouth daily. 90 tablet 3   fluticasone  (FLONASE ) 50 MCG/ACT nasal spray SMARTSIG:1-2 Spray(s) Both Nares Daily PRN 16 g 1   ibuprofen  (ADVIL ) 600 MG tablet Take 1 tablet (600 mg total) by mouth every 6 (six) hours as needed. 30 tablet 0   olmesartan -hydrochlorothiazide (BENICAR  HCT) 20-12.5 MG tablet Take 1 tablet by mouth daily. 90 tablet 3   propranolol  (INDERAL ) 20 MG tablet Take 1 tablet (20 mg total) by mouth 2 (two) times daily. 60 tablet 5   tadalafil  (CIALIS ) 20 MG tablet Take 1 tablet (20 mg total) by mouth as needed for erectile dysfunction. 30 tablet 1   methylPREDNISolone  (MEDROL  DOSEPAK) 4 MG TBPK tablet Per manufacturer's instructions 1 each 0   omeprazole  (PRILOSEC) 40 MG capsule Take 1 capsule (40 mg total) by mouth daily. 90 capsule 0   No facility-administered medications prior to visit.      ROS Review of Systems  Constitutional:  Negative for chills and fever.   HENT:  Positive for ear discharge and ear pain. Negative for congestion and sore throat.   Eyes:  Negative for pain and discharge.  Respiratory:  Negative for cough and shortness of breath.   Cardiovascular:  Negative for chest pain and palpitations.  Gastrointestinal:  Negative for diarrhea, nausea and vomiting.  Endocrine: Negative for polydipsia and polyuria.  Genitourinary:  Negative for dysuria and hematuria.  Musculoskeletal:  Positive for arthralgias (Left knee). Negative for neck pain and neck stiffness.  Skin:  Negative for rash.  Neurological:  Negative for dizziness, weakness, numbness and headaches.  Psychiatric/Behavioral:  Negative for agitation, behavioral problems and sleep disturbance. The patient is not nervous/anxious.       Objective:    Physical Exam Vitals reviewed.  Constitutional:      General: He is not in acute distress.    Appearance: He is obese. He is not diaphoretic.  HENT:     Head: Normocephalic and atraumatic.     Left Ear: Drainage and tenderness present.     Nose: Nose normal.     Mouth/Throat:     Mouth: Mucous membranes are moist.  Eyes:     General: No scleral icterus.    Extraocular Movements: Extraocular movements intact.  Cardiovascular:     Rate and Rhythm: Normal rate and regular rhythm.     Heart sounds: Normal heart sounds. No murmur heard. Pulmonary:     Breath sounds: Normal breath sounds. No wheezing or rales.  Musculoskeletal:     Cervical back: Neck supple. No tenderness.     Right lower leg: No edema.     Left lower leg: No edema.  Skin:    General: Skin is warm.     Findings: No rash.     Comments: Subcutaneous cyst over back of neck -about 1 cm in diameter Skin tag in left armpit  Neurological:     General: No focal deficit present.     Mental Status: He is alert and oriented to person, place, and time.     Sensory: No sensory deficit.     Motor: No weakness.  Psychiatric:        Mood and Affect: Mood normal.         Behavior: Behavior normal.        Cognition and Memory: Cognition normal.     BP 122/79   Pulse 69   Ht 6' (1.829 m)   Wt 280 lb 3.2 oz (127.1 kg)   SpO2 96%   BMI 38.00 kg/m  Wt Readings from Last 3 Encounters:  11/24/23 280 lb 3.2 oz (127.1 kg)  10/26/23 264 lb (119.7 kg)  10/03/23 264 lb (119.7 kg)    Lab Results  Component Value Date   TSH 1.860 05/12/2022   Lab Results  Component Value Date   WBC 18.1 (H) 10/03/2023   HGB 13.5 10/03/2023   HCT 39.4 10/03/2023   MCV 93.6 10/03/2023   PLT 300 10/03/2023   Lab Results  Component Value Date   NA 136 10/03/2023   K 3.7 10/03/2023   CO2 25 10/03/2023   GLUCOSE 94 10/03/2023   BUN 14 10/03/2023   CREATININE 1.11 10/03/2023   BILITOT 1.0 10/03/2023   ALKPHOS 122 10/03/2023   AST 17 10/03/2023   ALT 18 10/03/2023   PROT 7.2 10/03/2023   ALBUMIN 3.6 10/03/2023   CALCIUM  8.9 10/03/2023   ANIONGAP 12 10/03/2023   EGFR 89 05/12/2022   Lab Results  Component Value Date   CHOL 185 05/12/2022   Lab Results  Component Value Date   HDL 36 (L) 05/12/2022   Lab Results  Component Value Date   LDLCALC 122 (H) 05/12/2022   Lab Results  Component Value Date   TRIG 150 (H) 05/12/2022   Lab Results  Component Value Date   CHOLHDL 5.1 (H) 05/12/2022   Lab Results  Component Value Date   HGBA1C 5.6 05/12/2022      Assessment & Plan:   Problem List Items Addressed This Visit  Cardiovascular and Mediastinum   Migraine   Overall well-controlled On Propranolol  for ppx, takes it 20 mg QD Tylenol  PRN      Essential hypertension - Primary   BP Readings from Last 1 Encounters:  11/24/23 122/79   Well-controlled with Olmesartan -HCTZ 20-12.5 mg QD Counseled for compliance with the medications Advised DASH diet and moderate exercise/walking, at least 150 mins/week        Respiratory   Obstructive sleep apnea syndrome   STOP-BANG: 6 Very high risk for OSA Had ordered home sleep test, but he  did not get it done - but agrees to do it now, advised to contact [facility] Needs to continue to work on losing weight Side sleeping positions for now        Digestive   Gastroesophageal reflux disease   Uncontrolled with omeprazole  40 mg QD Switched to pantoprazole 40 mg QD Advised to avoid hot and spicy food      Relevant Medications   pantoprazole (PROTONIX) 40 MG tablet     Nervous and Auditory   Chronic suppurative otitis media of both ears   Has h/o recurrent otitis media Has completed ciprofloxacin  otic drops and Doxycycline , later Azithromycin  Has been evaluated by ENT specialist - did not opt for surgery        Other   Mixed hyperlipidemia   On Atorvastatin  Check lipid profile - needs to get updated blood tests      Morbid obesity (HCC)   BMI Readings from Last 3 Encounters:  11/24/23 38.00 kg/m  10/26/23 35.80 kg/m  10/03/23 35.80 kg/m   Associated with HTN, GERD, bipolar depression and HLD Had lost about 54 lbs with alcohol cessation and diet modification, but has been gaining weight lately Low-carb diet and moderate exercise advised      Other Visit Diagnoses       Colon cancer screening       Relevant Orders   Ambulatory referral to Gastroenterology          Meds ordered this encounter  Medications   pantoprazole (PROTONIX) 40 MG tablet    Sig: Take 1 tablet (40 mg total) by mouth daily.    Dispense:  90 tablet    Refill:  1    Follow-up: Return in about 6 months (around 05/26/2024) for HTN and HLD.    Suzzane MARLA Blanch, MD

## 2023-11-29 ENCOUNTER — Encounter (INDEPENDENT_AMBULATORY_CARE_PROVIDER_SITE_OTHER): Payer: Self-pay | Admitting: *Deleted

## 2023-12-07 ENCOUNTER — Telehealth: Payer: Self-pay

## 2023-12-07 ENCOUNTER — Other Ambulatory Visit: Payer: Self-pay | Admitting: Internal Medicine

## 2023-12-07 NOTE — Telephone Encounter (Signed)
 Copied from CRM [id number]. Topic: Clinical - Prescription Issue >> Dec 07, 2023  3:09 PM Selinda RAMAN wrote: Reason for CRM: The patient called in stating he has been on pantoprazole  (PROTONIX ) 40 MG tablet for almost 2 weeks and it is not helping him with his acid reflux. He states his provider told him to let him know if he had any problems or wanted to go back on the Omeprazole  which he states he does. He would like for a script for the Omeprazole  to be called into  [facility] [address], [place] - [address] [place] [address]  Phone: [phone] Fax: [phone]   Please assist patient further

## 2023-12-09 DIAGNOSIS — F411 Generalized anxiety disorder: Secondary | ICD-10-CM | POA: Diagnosis not present

## 2023-12-09 DIAGNOSIS — F902 Attention-deficit hyperactivity disorder, combined type: Secondary | ICD-10-CM | POA: Diagnosis not present

## 2023-12-09 DIAGNOSIS — F3162 Bipolar disorder, current episode mixed, moderate: Secondary | ICD-10-CM | POA: Diagnosis not present

## 2023-12-09 NOTE — Telephone Encounter (Signed)
 Left detailed vm

## 2023-12-27 DIAGNOSIS — H52223 Regular astigmatism, bilateral: Secondary | ICD-10-CM | POA: Diagnosis not present

## 2023-12-27 DIAGNOSIS — H524 Presbyopia: Secondary | ICD-10-CM | POA: Diagnosis not present

## 2023-12-27 DIAGNOSIS — H5213 Myopia, bilateral: Secondary | ICD-10-CM | POA: Diagnosis not present

## 2024-01-20 ENCOUNTER — Ambulatory Visit
Admission: EM | Admit: 2024-01-20 | Discharge: 2024-01-20 | Disposition: A | Discharge status: Home / Self Care | Attending: Nurse Practitioner | Admitting: Nurse Practitioner

## 2024-01-20 DIAGNOSIS — R6889 Other general symptoms and signs: Secondary | ICD-10-CM | POA: Diagnosis not present

## 2024-01-20 DIAGNOSIS — Z20828 Contact with and (suspected) exposure to other viral communicable diseases: Secondary | ICD-10-CM

## 2024-01-20 DIAGNOSIS — J069 Acute upper respiratory infection, unspecified: Secondary | ICD-10-CM

## 2024-01-20 LAB — POCT INFLUENZA A/B
Influenza A, POC: NEGATIVE
Influenza B, POC: NEGATIVE

## 2024-01-20 MED ORDER — FLUTICASONE PROPIONATE 50 MCG/ACT NA SUSP: 2.0000 | Freq: Every day | NASAL | 0 refills | Status: AC

## 2024-01-20 MED ORDER — OSELTAMIVIR PHOSPHATE 75 MG PO CAPS: 75.0000 mg | ORAL_CAPSULE | Freq: Two times a day (BID) | ORAL | 0 refills | Status: AC

## 2024-01-20 NOTE — Discharge Instructions (Addendum)
 The influenza test was negative.  However, given your recent close exposure, we will treat for influenza with Tamiflu. Take medication as prescribed.  Continue Mucinex that you are currently taking. Increase fluids and allow for plenty of rest. You may take over-the-counter Tylenol  as needed for pain, fever, or general discomfort. Recommend use of normal saline nasal spray throughout the day for nasal congestion or runny nose. For the cough, recommend use of a humidifier in your bedroom at nighttime during sleep and sleeping elevated on pillows while symptoms persist. Symptoms should continue to improve over the next 5 to 7 days.  If symptoms fail to improve, or begin to worsen, you may follow-up in this clinic or with your primary care physician for further evaluation. Follow-up as needed.

## 2024-01-20 NOTE — ED Triage Notes (Signed)
 Pt reports congestion, congestion, body aches fever, exposure to the flu.

## 2024-01-20 NOTE — ED Provider Notes (Signed)
 " [facility]    CSN: [id number] Arrival date & time: 01/20/24  1645      History   Chief Complaint No chief complaint on file.   HPI Mark Goodman is a 44 y.o. male.   The history is provided by the patient.   Patient presents for complaints of upper respiratory symptoms that been present for the past several days.  He states that his daughter has been diagnosed with the flu recently.  He complains of nasal congestion, cough, fatigue, and generalized bodyaches.  States he has been taking over-the-counter Mucinex for his symptoms.  Past Medical History:  Diagnosis Date   ADHD (attention deficit hyperactivity disorder)    Bipolar 1 disorder (HCC)    Cleft uvula    Hypertension    PTSD (post-traumatic stress disorder)     Patient Active Problem List   Diagnosis Date Noted   Chronic suppurative otitis media of both ears 05/24/2023   Skin tag 05/24/2023   Chronic pain of left knee 11/23/2022   Laceration of left forearm 05/13/2022   Erectile dysfunction 12/15/2021   Morbid obesity (HCC) 12/15/2021   Prediabetes 12/15/2021   Encounter for general adult medical examination with abnormal findings 04/09/2021   Obstructive sleep apnea syndrome 02/05/2021   Bipolar disorder (HCC) 04/24/2020   Gastroesophageal reflux disease 04/24/2020   Migraine 04/24/2020   Mixed hyperlipidemia 04/24/2020   Essential hypertension 04/24/2020   Anxiety 04/24/2020   Tobacco abuse 04/24/2020    Past Surgical History:  Procedure Laterality Date   EXTERNAL EAR SURGERY         Home Medications    Prior to Admission medications  Medication Sig Start Date End Date Taking? Authorizing Provider  fluticasone  (FLONASE ) 50 MCG/ACT nasal spray Place 2 sprays into both nostrils daily. 01/20/24  Yes Leath-Warren, Etta PARAS, NP  oseltamivir (TAMIFLU) 75 MG capsule Take 1 capsule (75 mg total) by mouth every 12 (twelve) hours. 01/20/24  Yes Leath-Warren, Etta PARAS, NP  acetaminophen   (TYLENOL ) 325 MG tablet Take 2 tablets (650 mg total) by mouth every 6 (six) hours as needed. 10/03/23   Elnor Jayson LABOR, DO  albuterol  (VENTOLIN  HFA) 108 (90 Base) MCG/ACT inhaler Inhale 2 puffs into the lungs every 6 (six) hours as needed for wheezing or shortness of breath. 07/04/23   Patel, Rutwik K, MD  ALPRAZolam (XANAX) 1 MG tablet Take 0.5-1 mg by mouth daily as needed. 09/26/20   [provider]  aspirin  EC 81 MG tablet Take 1 tablet (81 mg total) by mouth daily. Swallow whole. 10/05/21   Tobie Suzzane POUR, MD  atorvastatin  (LIPITOR) 20 MG tablet Take 1 tablet (20 mg total) by mouth daily. 11/21/23   Tobie Suzzane POUR, MD  ibuprofen  (ADVIL ) 600 MG tablet Take 1 tablet (600 mg total) by mouth every 6 (six) hours as needed. 10/03/23   Elnor Jayson LABOR, DO  olmesartan -hydrochlorothiazide (BENICAR  HCT) 20-12.5 MG tablet Take 1 tablet by mouth daily. 11/21/23   Tobie Suzzane POUR, MD  pantoprazole  (PROTONIX ) 40 MG tablet Take 1 tablet (40 mg total) by mouth daily. 11/24/23   Tobie Suzzane POUR, MD  propranolol  (INDERAL ) 20 MG tablet Take 1 tablet (20 mg total) by mouth 2 (two) times daily. 11/21/23   Tobie Suzzane POUR, MD  tadalafil  (CIALIS ) 20 MG tablet Take 1 tablet (20 mg total) by mouth as needed for erectile dysfunction. 11/23/22   Tobie Suzzane POUR, MD    Family History History reviewed. No pertinent family  history.  Social History Social History[1]   Allergies   Tomato and Penicillins   Review of Systems Review of Systems Per HPI  Physical Exam Triage Vital Signs ED Triage Vitals  Encounter Vitals Group     BP 01/20/24 1813 122/81     Girls Systolic BP Percentile --      Girls Diastolic BP Percentile --      Boys Systolic BP Percentile --      Boys Diastolic BP Percentile --      Pulse Rate 01/20/24 1813 72     Resp 01/20/24 1813 18     Temp 01/20/24 1813 97.7 F (36.5 C)     Temp Source 01/20/24 1813 Oral     SpO2 01/20/24 1813 90 %     Weight --      Height --      Head  Circumference --      Peak Flow --      Pain Score 01/20/24 1815 0     Pain Loc --      Pain Education --      Exclude from Growth Chart --    No data found.  Updated Vital Signs BP 122/81 (BP Location: Right Arm)   Pulse 72   Temp 97.7 F (36.5 C) (Oral)   Resp 18   SpO2 90%   Visual Acuity Right Eye Distance:   Left Eye Distance:   Bilateral Distance:    Right Eye Near:   Left Eye Near:    Bilateral Near:     Physical Exam Vitals and nursing note reviewed.  Constitutional:      General: He is not in acute distress.    Appearance: Normal appearance.  HENT:     Head: Normocephalic.     Right Ear: Tympanic membrane, ear canal and external ear normal.     Left Ear: Tympanic membrane, ear canal and external ear normal.     Nose: Congestion present.     Mouth/Throat:     Mouth: Mucous membranes are moist.  Eyes:     Extraocular Movements: Extraocular movements intact.     Conjunctiva/sclera: Conjunctivae normal.     Pupils: Pupils are equal, round, and reactive to light.  Cardiovascular:     Rate and Rhythm: Normal rate and regular rhythm.     Pulses: Normal pulses.     Heart sounds: Normal heart sounds.  Pulmonary:     Effort: Pulmonary effort is normal. No respiratory distress.     Breath sounds: Normal breath sounds. No stridor. No wheezing, rhonchi or rales.  Abdominal:     General: Bowel sounds are normal.     Palpations: Abdomen is soft.  Musculoskeletal:     Cervical back: Normal range of motion.  Skin:    General: Skin is warm and dry.  Neurological:     General: No focal deficit present.     Mental Status: He is alert and oriented to person, place, and time.  Psychiatric:        Mood and Affect: Mood normal.        Behavior: Behavior normal.      UC Treatments / Results  Labs (all labs ordered are listed, but only abnormal results are displayed) Labs Reviewed  POCT INFLUENZA A/B - Normal    EKG   Radiology No results  found.  Procedures Procedures (including critical care time)  Medications Ordered in UC Medications - No data to display  Initial Impression / Assessment  and Plan / UC Course  I have reviewed the triage vital signs and the nursing notes.  Pertinent labs & imaging results that were available during my care of the patient were reviewed by me and considered in my medical decision making (see chart for details).  Influenza test was negative.  Given the recent exposure, will treat with Tamiflu 75 mg.  Patient continues to experience cough and nasal congestion.  Will provide symptomatic treatment with fluticasone  50 mcg nasal spray for nasal congestion.  Patient will continue Mucinex for the cough.  Supportive care recommendations were provided and discussed with the patient to include fluids, rest, over-the-counter analgesics, use of normal saline nasal spray, and use of a humidifier during sleep.  Discussed indications with the patient regarding follow-up.  Patient was in agreement with this plan of care and verbalizes understanding.  All questions were answered.  Patient stable for discharge.  Final Clinical Impressions(s) / UC Diagnoses   Final diagnoses:  Viral URI with cough  Exposure to the flu  Flu-like symptoms     Discharge Instructions      The influenza test was negative.  However, given your recent close exposure, we will treat for influenza with Tamiflu. Take medication as prescribed.  Continue Mucinex that you are currently taking. Increase fluids and allow for plenty of rest. You may take over-the-counter Tylenol  as needed for pain, fever, or general discomfort. Recommend use of normal saline nasal spray throughout the day for nasal congestion or runny nose. For the cough, recommend use of a humidifier in your bedroom at nighttime during sleep and sleeping elevated on pillows while symptoms persist. Symptoms should continue to improve over the next 5 to 7 days.  If symptoms  fail to improve, or begin to worsen, you may follow-up in this clinic or with your primary care physician for further evaluation. Follow-up as needed.     ED Prescriptions     Medication Sig Dispense Auth. Provider   oseltamivir (TAMIFLU) 75 MG capsule Take 1 capsule (75 mg total) by mouth every 12 (twelve) hours. 10 capsule Leath-Warren, Etta PARAS, NP   fluticasone  (FLONASE ) 50 MCG/ACT nasal spray Place 2 sprays into both nostrils daily. 16 g Leath-Warren, Etta PARAS, NP      PDMP not reviewed this encounter.    [1]  Social History Tobacco Use   Smoking status: Every Day    Current packs/day: 1.00    Average packs/day: 1.3 packs/day for 37.0 years (47.0 ttl pk-yrs)    Types: Cigarettes    Start date: 1989   Smokeless tobacco: Never  Vaping Use   Vaping status: Never Used  Substance Use Topics   Alcohol use: No   Drug use: No     Gilmer Etta PARAS, NP 01/20/24 1847  "

## 2024-05-23 ENCOUNTER — Ambulatory Visit: Admitting: Internal Medicine

## 2024-10-29 ENCOUNTER — Ambulatory Visit
